# Patient Record
Sex: Female | Born: 1950 | Race: White | Hispanic: No | Marital: Single | Smoking: Former smoker
Health system: Southern US, Community
[De-identification: ages and names within clinical notes are randomized; demographics above are authoritative.]

## PROBLEM LIST (undated history)

## (undated) DIAGNOSIS — R7303 Prediabetes: Secondary | ICD-10-CM

## (undated) DIAGNOSIS — I240 Acute coronary thrombosis not resulting in myocardial infarction: Secondary | ICD-10-CM

## (undated) DIAGNOSIS — E119 Type 2 diabetes mellitus without complications: Secondary | ICD-10-CM

## (undated) DIAGNOSIS — I10 Essential (primary) hypertension: Secondary | ICD-10-CM

## (undated) DIAGNOSIS — J302 Other seasonal allergic rhinitis: Secondary | ICD-10-CM

## (undated) DIAGNOSIS — K219 Gastro-esophageal reflux disease without esophagitis: Secondary | ICD-10-CM

## (undated) DIAGNOSIS — E7800 Pure hypercholesterolemia, unspecified: Secondary | ICD-10-CM

## (undated) DIAGNOSIS — J449 Chronic obstructive pulmonary disease, unspecified: Secondary | ICD-10-CM

## (undated) DIAGNOSIS — M1990 Unspecified osteoarthritis, unspecified site: Secondary | ICD-10-CM

## (undated) DIAGNOSIS — F40240 Claustrophobia: Secondary | ICD-10-CM

## (undated) DIAGNOSIS — J45909 Unspecified asthma, uncomplicated: Secondary | ICD-10-CM

## (undated) DIAGNOSIS — K5730 Diverticulosis of large intestine without perforation or abscess without bleeding: Secondary | ICD-10-CM

## (undated) HISTORY — DX: Other seasonal allergic rhinitis: J30.2

## (undated) HISTORY — DX: Essential (primary) hypertension: I10

## (undated) HISTORY — DX: Acute coronary thrombosis not resulting in myocardial infarction: I24.0

## (undated) HISTORY — DX: Pure hypercholesterolemia, unspecified: E78.00

## (undated) HISTORY — PX: COLON RESECTION: SHX5231

---

## 1974-09-29 HISTORY — PX: ABLATION ON ENDOMETRIOSIS: SHX5787

## 1993-09-29 HISTORY — PX: FOOT SURGERY: SHX648

## 2006-07-29 ENCOUNTER — Ambulatory Visit: Payer: Self-pay

## 2006-10-28 ENCOUNTER — Ambulatory Visit: Payer: Self-pay | Admitting: Internal Medicine

## 2007-02-24 ENCOUNTER — Ambulatory Visit: Payer: Self-pay | Admitting: Unknown Physician Specialty

## 2007-03-11 ENCOUNTER — Ambulatory Visit: Payer: Self-pay | Admitting: Unknown Physician Specialty

## 2007-09-30 LAB — HM PAP SMEAR: HM Pap smear: NORMAL

## 2008-07-05 ENCOUNTER — Ambulatory Visit: Payer: Self-pay | Admitting: Unknown Physician Specialty

## 2008-09-29 DIAGNOSIS — K5730 Diverticulosis of large intestine without perforation or abscess without bleeding: Secondary | ICD-10-CM

## 2008-09-29 HISTORY — DX: Diverticulosis of large intestine without perforation or abscess without bleeding: K57.30

## 2008-09-29 HISTORY — PX: CARDIAC CATHETERIZATION: SHX172

## 2008-09-29 HISTORY — PX: COLON SURGERY: SHX602

## 2008-09-29 LAB — HM COLONOSCOPY

## 2008-11-27 ENCOUNTER — Ambulatory Visit: Payer: Self-pay | Admitting: Unknown Physician Specialty

## 2009-01-02 ENCOUNTER — Ambulatory Visit: Payer: Self-pay | Admitting: Surgery

## 2009-01-02 ENCOUNTER — Ambulatory Visit: Payer: Self-pay | Admitting: Cardiology

## 2009-01-08 ENCOUNTER — Inpatient Hospital Stay: Payer: Self-pay | Admitting: Surgery

## 2009-09-29 HISTORY — PX: CHOLECYSTECTOMY: SHX55

## 2010-01-10 ENCOUNTER — Ambulatory Visit: Payer: Self-pay | Admitting: Cardiology

## 2010-02-04 ENCOUNTER — Ambulatory Visit: Payer: Self-pay | Admitting: Internal Medicine

## 2010-02-06 ENCOUNTER — Ambulatory Visit: Payer: Self-pay | Admitting: Internal Medicine

## 2010-02-26 ENCOUNTER — Ambulatory Visit: Payer: Self-pay | Admitting: Surgery

## 2010-04-16 ENCOUNTER — Other Ambulatory Visit: Payer: Self-pay | Admitting: Unknown Physician Specialty

## 2011-02-18 ENCOUNTER — Ambulatory Visit: Payer: Self-pay | Admitting: Internal Medicine

## 2011-09-30 LAB — HM MAMMOGRAPHY: HM MAMMO: NORMAL

## 2012-08-12 ENCOUNTER — Ambulatory Visit: Payer: Self-pay | Admitting: Internal Medicine

## 2013-07-29 DIAGNOSIS — H4440 Unspecified hypotony of eye: Secondary | ICD-10-CM | POA: Insufficient documentation

## 2013-07-29 DIAGNOSIS — H16079 Perforated corneal ulcer, unspecified eye: Secondary | ICD-10-CM | POA: Insufficient documentation

## 2013-12-27 ENCOUNTER — Ambulatory Visit: Payer: Self-pay | Admitting: Otolaryngology

## 2014-01-31 ENCOUNTER — Encounter: Payer: Self-pay | Admitting: Pulmonary Disease

## 2014-01-31 ENCOUNTER — Encounter (INDEPENDENT_AMBULATORY_CARE_PROVIDER_SITE_OTHER): Payer: Self-pay

## 2014-01-31 ENCOUNTER — Ambulatory Visit (INDEPENDENT_AMBULATORY_CARE_PROVIDER_SITE_OTHER): Payer: BC Managed Care – PPO | Admitting: Pulmonary Disease

## 2014-01-31 VITALS — BP 126/68 | HR 81 | Ht 62.5 in | Wt 129.0 lb

## 2014-01-31 DIAGNOSIS — R4702 Dysphasia: Secondary | ICD-10-CM

## 2014-01-31 DIAGNOSIS — R0602 Shortness of breath: Secondary | ICD-10-CM

## 2014-01-31 DIAGNOSIS — R4789 Other speech disturbances: Secondary | ICD-10-CM

## 2014-01-31 DIAGNOSIS — J449 Chronic obstructive pulmonary disease, unspecified: Secondary | ICD-10-CM

## 2014-01-31 DIAGNOSIS — R059 Cough, unspecified: Secondary | ICD-10-CM

## 2014-01-31 DIAGNOSIS — R05 Cough: Secondary | ICD-10-CM

## 2014-01-31 MED ORDER — TIOTROPIUM BROMIDE MONOHYDRATE 18 MCG IN CAPS: 18.0000 ug | ORAL_CAPSULE | Freq: Every day | RESPIRATORY_TRACT | Status: DC

## 2014-01-31 MED ORDER — AEROCHAMBER MV MISC: Status: DC

## 2014-01-31 MED ORDER — BENZONATATE 100 MG PO CAPS: 100.0000 mg | ORAL_CAPSULE | Freq: Four times a day (QID) | ORAL | Status: DC | PRN

## 2014-01-31 NOTE — Assessment & Plan Note (Signed)
She had the sensation of food impaction several weeks ago when she was quite ill. This is improved. She was told it was associated with her gastroesophageal reflux disease. We will dyspnea keep an eye on this in the future. It doesn't seem to be a problem right now.

## 2014-01-31 NOTE — Progress Notes (Signed)
Subjective:    Patient ID: Monique Sanchez, female    DOB: 03-10-1951, 63 y.o.   MRN: [id number]  HPI  This is a very pleasant 63 year old female who comes her clinic today for evaluation of cough. This started approximately 2 months ago when she had a significant sinus infection. The sinus infection was associated with fevers chills and body aches and she had the at work about a week. She was seen by [facility] and was treated with prednisone and Levaquin. This helped clean up the sinus infection but she has had a cough ever since then. She still continues to cough up some clear mucus on a regular basis. Notably, prior to the sinus infection she never had problems with a cough.  She been treated with Symbicort for the last 2 years after she told her primary care doctor that she had been experiencing some shortness of breath. She was never given a diagnosis. She said that the Symbicort to help with shortness of breath. Specifically she says that she'll get short of breath with walking up a flight of stairs or running the vacuum cleaner. She continues to work on a regular basis and has not had problems with shortness of breath at work. She does not exercise on a regular basis. She does not get flu shots because she had a very severe reaction to one many years ago. She has not had a pneumonia shot.  She previously smoked one half packs of cigarettes daily through 2009.  She occasionally notes that food gets stuck when she swallows.   Past Medical History  Diagnosis Date  . Hypertension   . Hypercholesteremia   . Seasonal allergies   . Blockage of coronary artery of heart      Family History  Problem Relation Age of Onset  . Heart disease Mother   . Heart disease Father   . Cancer Mother     lung  . Cancer Brother     lung     History   Social History  . Marital Status: Single    Spouse Name: N/A    Number of Children: N/A  . Years of Education: N/A    Occupational History  . Not on file.   Social History Main Topics  . Smoking status: Former Smoker -- 0.50 packs/day for 30 years    Types: Cigarettes    Quit date: 09/30/2007  . Smokeless tobacco: Never Used  . Alcohol Use: Yes     Comment: occasional wine  . Drug Use: No  . Sexual Activity: Not on file   Other Topics Concern  . Not on file   Social History Narrative  . No narrative on file     Allergies  Allergen Reactions  . Influenza Vaccines     Muscle cramps, shaking     No outpatient prescriptions prior to visit.   No facility-administered medications prior to visit.      Review of Systems  Constitutional: Negative for fever and unexpected weight change.  HENT: Positive for sinus pressure and trouble swallowing. Negative for congestion, dental problem, ear pain, nosebleeds, postnasal drip, rhinorrhea, sneezing and sore throat.   Eyes: Negative for redness and itching.  Respiratory: Positive for cough and shortness of breath. Negative for chest tightness and wheezing.   Cardiovascular: Negative for palpitations and leg swelling.  Gastrointestinal: Negative for nausea and vomiting.  Genitourinary: Negative for dysuria.  Musculoskeletal: Positive for joint swelling.  Skin: Negative for rash.  Neurological: Negative for headaches.  Hematological: Does not bruise/bleed easily.  Psychiatric/Behavioral: Negative for dysphoric mood. The patient is not nervous/anxious.        Objective:   Physical Exam  Filed Vitals:   01/31/14 1155  BP: 126/68  Pulse: 81  Height: 5' 2.5" (1.588 m)  Weight: 129 lb (58.514 kg)  SpO2: 96%   Gen: well appearing, no acute distress HEENT: NCAT, PERRL, EOMi, OP clear, neck supple without masses PULM: Poor air movement bilaterally CV: RRR, no mgr, no JVD AB: BS+, soft, nontender, no hsm Ext: warm, no edema, no clubbing, no cyanosis Derm: no rash or skin breakdown Neuro: A&Ox4, CN II-XII intact, strength 5/5 in all 4  extremities   April 2015 chest x-ray reviewed  hyperinflation bilaterally      Assessment & Plan:   COPD, severe COPD: GOLD Grade C Combined recommendations from the [place], [employer] of Chest Physicians, Investment banker, corporate, European Respiratory Society (Qaseem A et al, Ann Intern Med. 2011;155(3):179) recommends tobacco cessation, pulmonary rehab (for symptomatic patients with an FEV1 < 50% predicted), supplemental oxygen (for patients with SaO2 <88% or paO2 <55), and appropriate bronchodilator therapy.  In regards to long acting bronchodilators, they recommend monotherapy (FEV1 60-80% with symptoms weak evidence, FEV1 with symptoms <60% strong evidence), or combination therapy (FEV1 <60% with symptoms, strong recommendation, moderate evidence).  One should also provide patients with annual immunizations and consider therapy for prevention of COPD exacerbations (ie. roflumilast or azithromycin) when appopriate.  -O2 therapy: not indicated -Immunizations: flu allergy -Tobacco use: quit 2009 -Exercise: encouraged regular exercise -Bronchodilator therapy: add spacer for symbicort, add spiriva -Exacerbation prevention: spiriva     Cough This is really her primary problem and I think is mostly due to 2 cyclical cough from all the on going cough she's had in the last several months as well as ongoing postnasal drip and acid reflux. I don't think his COPD is contributing significantly to the cough.  Plan: -Add Pepcid at night -Add nasal steroid and antihistamine -Voice rest encouraged -Tessalon when necessary  Dysphasia She had the sensation of food impaction several weeks ago when she was quite ill. This is improved. She was told it was associated with her gastroesophageal reflux disease. We will dyspnea keep an eye on this in the future. It doesn't seem to be a problem right now.    Updated Medication List Outpatient Encounter Prescriptions as  of 01/31/2014  Medication Sig  . acetaminophen (TYLENOL) 325 MG tablet Take 650 mg by mouth every 6 (six) hours as needed.  . budesonide-formoterol (SYMBICORT) 160-4.5 MCG/ACT inhaler Inhale 2 puffs into the lungs 2 (two) times daily.  . carvedilol (COREG CR) 10 MG 24 hr capsule Take 10 mg by mouth daily.  . hydrochlorothiazide (HYDRODIURIL) 25 MG tablet Take 25 mg by mouth daily.  . lansoprazole (PREVACID) 30 MG capsule Take 30 mg by mouth daily at 12 noon.  Marland Kitchen OVER THE COUNTER MEDICATION Phason- otc gas relief- prn  . rosuvastatin (CRESTOR) 5 MG tablet Take 5 mg by mouth daily.  . benzonatate (TESSALON) 100 MG capsule Take 1 capsule (100 mg total) by mouth every 6 (six) hours as needed for cough.  . Spacer/Aero-Holding Chambers (AEROCHAMBER MV) inhaler Use as instructed  . tiotropium (SPIRIVA HANDIHALER) 18 MCG inhalation capsule Place 1 capsule (18 mcg total) into inhaler and inhale daily.

## 2014-01-31 NOTE — Assessment & Plan Note (Signed)
This is really her primary problem and I think is mostly due to 2 cyclical cough from all the on going cough she's had in the last several months as well as ongoing postnasal drip and acid reflux. I don't think his COPD is contributing significantly to the cough.  Plan: -Add Pepcid at night -Add nasal steroid and antihistamine -Voice rest encouraged -Tessalon when necessary

## 2014-01-31 NOTE — Assessment & Plan Note (Signed)
COPD: GOLD Grade C Combined recommendations from the [employer], [employer], Investment banker, corporate, [place] (Qaseem A et al, Ann Intern Med. 2011;155(3):179) recommends tobacco cessation, pulmonary rehab (for symptomatic patients with an FEV1 < 50% predicted), supplemental oxygen (for patients with SaO2 <88% or paO2 <55), and appropriate bronchodilator therapy.  In regards to long acting bronchodilators, they recommend monotherapy (FEV1 60-80% with symptoms weak evidence, FEV1 with symptoms <60% strong evidence), or combination therapy (FEV1 <60% with symptoms, strong recommendation, moderate evidence).  One should also provide patients with annual immunizations and consider therapy for prevention of COPD exacerbations (ie. roflumilast or azithromycin) when appopriate.  -O2 therapy: not indicated -Immunizations: flu allergy -Tobacco use: quit 2009 -Exercise: encouraged regular exercise -Bronchodilator therapy: add spacer for symbicort, add spiriva -Exacerbation prevention: spiriva

## 2014-01-31 NOTE — Patient Instructions (Signed)
Take spiriva with the symbicort Use the symbicort with a spacer  Exercise regularly  For the cough: I recommend treating your sinuses with zyrtec and nasacort; take pepcid at night in addition to the lansoprazole Take the tessalon perles for cough Rest your voice, try not to clear your throat  We will se you back in 3-4 weeks or sooner if needed

## 2014-03-01 ENCOUNTER — Encounter: Payer: Self-pay | Admitting: Pulmonary Disease

## 2014-03-01 ENCOUNTER — Ambulatory Visit (INDEPENDENT_AMBULATORY_CARE_PROVIDER_SITE_OTHER): Payer: BC Managed Care – PPO | Admitting: Pulmonary Disease

## 2014-03-01 VITALS — BP 116/64 | HR 75 | Ht 62.5 in | Wt 134.0 lb

## 2014-03-01 DIAGNOSIS — T148XXA Other injury of unspecified body region, initial encounter: Secondary | ICD-10-CM

## 2014-03-01 DIAGNOSIS — J449 Chronic obstructive pulmonary disease, unspecified: Secondary | ICD-10-CM

## 2014-03-01 DIAGNOSIS — Z23 Encounter for immunization: Secondary | ICD-10-CM

## 2014-03-01 DIAGNOSIS — J309 Allergic rhinitis, unspecified: Secondary | ICD-10-CM | POA: Insufficient documentation

## 2014-03-01 NOTE — Progress Notes (Signed)
Subjective:    Patient ID: Monique Sanchez, female    DOB: 16-Sep-1951, 63 y.o.   MRN: [id number]  Synopsis: First saw [facility] for GOLD Grade C COPD in 2015 01/2014 Simple spirometry> Ratio 36%, FEV1  0.54L  (24% pred)  HPI  03/01/2014 routine office visit> Monique Sanchez has been doing quite well since last visit. Her cough has improved. She does not have significant shortness of breath. She cannot tell that the Spiriva made any difference in her shortness of breath. She continues to take Symbicort. She continues to have a little bit of postnasal drip. She is taking Zyrtec for that. She's not taking Flonase. Unfortunately she stepped on a rusty nail last week and she needs a tetanus shot for that.  Past Medical History  Diagnosis Date  . Hypertension   . Hypercholesteremia   . Seasonal allergies   . Blockage of coronary artery of heart      Review of Systems  Constitutional: Negative for fever, chills and fatigue.  HENT: Positive for postnasal drip. Negative for nosebleeds and rhinorrhea.   Respiratory: Negative for cough, shortness of breath and wheezing.   Cardiovascular: Negative for chest pain, palpitations and leg swelling.       Objective:   Physical Exam  Filed Vitals:   03/01/14 1027  BP: 116/64  Pulse: 75  Height: 5' 2.5" (1.588 m)  Weight: 134 lb (60.782 kg)  SpO2: 100%  RA  Gen: well appearing, no acute distress HEENT: NCAT, EOMi, OP clear, PULM: Limited air movement but no wheezing CV: RRR, no mgr, no JVD AB: BS+, soft, nontender, no hsm Ext: warm, no edema, no clubbing, no cyanosis Derm: Left foot small puncture wound on sole of foot, no surrounding cellulitis     Assessment & Plan:   COPD Grade C Despite her severe airflow obstruction she has done really well with Symbicort and Spriva. The main reason why she is feeling better now is that her cough is better. It's not clear to me that this is due to [place].   She does not have frequent  exacerbations and so even though her airflow obstruction as severe she is a GOLD grade C.  Plan: -Continue Symbicort -I told her to use the Spiriva continuously for a month, if she does not see improvement in her shortness of breath and she can stop it -Followup with me 6 months -Flu shot in the fall  Puncture wound She stepped on a nail recently. The puncture wound is clean and dry and there is no evidence of surrounding cellulitis. She needs a tetanus shot. She also needs a primary care physician.  Allergic rhinitis Continue Zyrtec I again reminded her of the benefits of Flonase or Nasacort.  She will consider using it.    Updated Medication List Outpatient Encounter Prescriptions as of 03/01/2014  Medication Sig  . acetaminophen (TYLENOL) 325 MG tablet Take 650 mg by mouth every 6 (six) hours as needed.  . budesonide-formoterol (SYMBICORT) 160-4.5 MCG/ACT inhaler Inhale 2 puffs into the lungs 2 (two) times daily.  . carvedilol (COREG CR) 10 MG 24 hr capsule Take 10 mg by mouth daily.  . hydrochlorothiazide (HYDRODIURIL) 25 MG tablet Take 25 mg by mouth daily.  . lansoprazole (PREVACID) 30 MG capsule Take 30 mg by mouth daily at 12 noon.  Marland Kitchen OVER THE COUNTER MEDICATION Phason- otc gas relief- prn  . rosuvastatin (CRESTOR) 5 MG tablet Take 5 mg by mouth daily.  Marland Kitchen Spacer/Aero-Holding Chambers (AEROCHAMBER MV)  inhaler Use as instructed  . tiotropium (SPIRIVA HANDIHALER) 18 MCG inhalation capsule Place 1 capsule (18 mcg total) into inhaler and inhale daily.  . [DISCONTINUED] benzonatate (TESSALON) 100 MG capsule Take 1 capsule (100 mg total) by mouth every 6 (six) hours as needed for cough.

## 2014-03-01 NOTE — Assessment & Plan Note (Signed)
She stepped on a nail recently. The puncture wound is clean and dry and there is no evidence of surrounding cellulitis. She needs a tetanus shot. She also needs a primary care physician.

## 2014-03-01 NOTE — Assessment & Plan Note (Signed)
Despite her severe airflow obstruction she has done really well with Symbicort and Spriva. The main reason why she is feeling better now is that her cough is better. It's not clear to me that this is due to [place].   She does not have frequent exacerbations and so even though her airflow obstruction as severe she is a GOLD grade C.  Plan: -Continue Symbicort -I told her to use the Spiriva continuously for a month, if she does not see improvement in her shortness of breath and she can stop it -Followup with me 6 months -Flu shot in the fall

## 2014-03-01 NOTE — Assessment & Plan Note (Signed)
Continue Zyrtec I again reminded her of the benefits of Flonase or Nasacort.  She will consider using it.

## 2014-03-01 NOTE — Patient Instructions (Signed)
Keep taking your Symbicort and Spiriva If you find after a month of using both medicines that your shortness of breath is not improved, then you can stop using the Spiriva If your nasal congestion is bothering you, try using Nasacort on a regular basis  We will see you back in 6 months or sooner if needed

## 2014-04-06 ENCOUNTER — Ambulatory Visit (INDEPENDENT_AMBULATORY_CARE_PROVIDER_SITE_OTHER): Payer: BC Managed Care – PPO | Admitting: Adult Health

## 2014-04-06 ENCOUNTER — Encounter: Payer: Self-pay | Admitting: Adult Health

## 2014-04-06 VITALS — BP 136/78 | HR 64 | Temp 98.2°F | Resp 14 | Ht 62.0 in | Wt 133.0 lb

## 2014-04-06 DIAGNOSIS — R7309 Other abnormal glucose: Secondary | ICD-10-CM

## 2014-04-06 DIAGNOSIS — M542 Cervicalgia: Secondary | ICD-10-CM | POA: Insufficient documentation

## 2014-04-06 DIAGNOSIS — R739 Hyperglycemia, unspecified: Secondary | ICD-10-CM

## 2014-04-06 DIAGNOSIS — Z1239 Encounter for other screening for malignant neoplasm of breast: Secondary | ICD-10-CM

## 2014-04-06 DIAGNOSIS — Z13820 Encounter for screening for osteoporosis: Secondary | ICD-10-CM

## 2014-04-06 DIAGNOSIS — Z1211 Encounter for screening for malignant neoplasm of colon: Secondary | ICD-10-CM

## 2014-04-06 LAB — HEMOGLOBIN A1C: Hgb A1c MFr Bld: 6.2 % (ref 4.6–6.5)

## 2014-04-06 MED ORDER — METHOCARBAMOL 500 MG PO TABS: 500.0000 mg | ORAL_TABLET | Freq: Three times a day (TID) | ORAL | Status: DC | PRN

## 2014-04-06 NOTE — Patient Instructions (Signed)
   Thank you for choosing [place] at [facility] for your health care needs.  Please have your labs drawn prior to leaving the office.  The results will be available through MyChart for your convenience. Please remember to activate this. The activation code is located at the end of this form.  Please see Hoyle Sauer prior to leaving the office so that she help set up appointments for the following:   Mammogram  Dexa Scan  Screening Colonoscopy  You will need to schedule your annual physical exam including PAP. You can do this at your convenience.  For your neck muscle tension I am prescribing Robaxin 500 mg 3 times a day as needed. This may make you sleepy so please do not drive while taking this medication.  Apply ice to the affected area for 20 min. You may alternate with heat.  Continue ibuprofen for pain and inflammation.  Do stretching exercises.  Please call with any questions or concerns.

## 2014-04-06 NOTE — Progress Notes (Signed)
Patient ID: DEMETRESS TIFT, female   DOB: [birth year], 63 y.o.   MRN: [id number]   Subjective:    Patient ID: MEHAR SAGEN, female    DOB: October 21, 1950, 63 y.o.   MRN: [id number]  HPI Pt is a 63 y/o female who presents to clinic to establish care. Previously followed at [facility] by Dr. Doy Hutching.   Health Maintenance:  Tdap - 03/01/14  Flu shot - Allergy  PAP - 6 year ago. Will need.  Mammography - 2 years ago - reported normal  Dexa - 10 years ago  Colonoscopy - 2009 - Hx of Polyps. Partial colectomy for diverticulitis  Labs - 06/2014  Tobacco Use - Remote hx  Dental Exams - Every 6 months (04/04/14)  Vision Exam - 10/29/13 [facility]  Last Physical Exam - 12/2013   Current Outpatient Prescriptions on File Prior to Visit  Medication Sig Dispense Refill  . acetaminophen (TYLENOL) 325 MG tablet Take 650 mg by mouth every 6 (six) hours as needed.      . budesonide-formoterol (SYMBICORT) 160-4.5 MCG/ACT inhaler Inhale 2 puffs into the lungs 2 (two) times daily.      . carvedilol (COREG CR) 10 MG 24 hr capsule Take 10 mg by mouth daily.      . hydrochlorothiazide (HYDRODIURIL) 25 MG tablet Take 25 mg by mouth daily.      . lansoprazole (PREVACID) 30 MG capsule Take 30 mg by mouth daily at 12 noon.      Marland Kitchen OVER THE COUNTER MEDICATION Phason- otc gas relief- prn      . rosuvastatin (CRESTOR) 5 MG tablet Take 5 mg by mouth daily.      Marland Kitchen Spacer/Aero-Holding Chambers (AEROCHAMBER MV) inhaler Use as instructed  1 each  0  . tiotropium (SPIRIVA HANDIHALER) 18 MCG inhalation capsule Place 1 capsule (18 mcg total) into inhaler and inhale daily.  30 capsule  2   No current facility-administered medications on file prior to visit.     Review of Systems  Constitutional: Negative.   HENT: Negative.   Eyes: Negative.   Respiratory: Negative.   Cardiovascular: Negative.   Gastrointestinal: Negative.   Endocrine: Negative.   Genitourinary: Negative.   Musculoskeletal:  Positive for neck pain (muscle spasms right side).  Skin: Negative.   Allergic/Immunologic: Negative.   Neurological: Negative.   Hematological: Negative.   Psychiatric/Behavioral: Negative.        Objective:  There were no vitals taken for this visit.   Physical Exam  Constitutional: She is oriented to person, place, and time. No distress.  HENT:  Head: Normocephalic and atraumatic.  Eyes: Conjunctivae and EOM are normal.  Neck: Normal range of motion. Neck supple.  Cardiovascular: Normal rate, regular rhythm, normal heart sounds and intact distal pulses.  Exam reveals no gallop and no friction rub.   No murmur heard. Pulmonary/Chest: Effort normal and breath sounds normal. No respiratory distress. She has no wheezes. She has no rales.  Musculoskeletal:  Decreased ROM neck. Trapezius muscle tension on the right side of neck.  Neurological: She is alert and oriented to person, place, and time. She has normal reflexes. Coordination normal.  Skin: Skin is warm and dry.  Psychiatric: She has a normal mood and affect. Her behavior is normal. Judgment and thought content normal.      Assessment & Plan:   1. Screening for breast cancer Refer for mammogram. Last one 2 years ago. New pt. - MM DIGITAL SCREENING BILATERAL; Future  2. Screen  for colon cancer Hx of polyps. Partial colectomy for diverticulosis with multiple episodes of diverticulitis. Ref back to Dr. Vira Agar for screening colonoscopy. - Ambulatory referral to Gastroenterology  3. Blood glucose elevated Reports elevated glucose in the past without diagnosis of diabetes. Check A1c - Hemoglobin A1c; Future - Hemoglobin A1c  4. Screening for osteoporosis Dexa scan last done > 7 years ago. Will order one to be done. - DG Bone Density; Future  5. Neck pain on right side Suspect strain of trapezius muscle on the right. Decreased ROM of neck secondary to stiffness and pain. Continue ibuprofen. Start robaxin for muscle  spasms. Advised on stretching exercises. Ice alternating with heat.

## 2014-04-06 NOTE — Progress Notes (Signed)
Pre visit review using our clinic review tool, if applicable. No additional management support is needed unless otherwise documented below in the visit note. 

## 2014-04-12 ENCOUNTER — Other Ambulatory Visit: Payer: Self-pay | Admitting: Orthopedic Surgery

## 2014-04-12 ENCOUNTER — Ambulatory Visit
Admission: RE | Admit: 2014-04-12 | Discharge: 2014-04-12 | Disposition: A | Discharge status: Home / Self Care | Payer: BC Managed Care – PPO | Source: Ambulatory Visit | Attending: Orthopedic Surgery | Admitting: Orthopedic Surgery

## 2014-04-12 DIAGNOSIS — M542 Cervicalgia: Secondary | ICD-10-CM

## 2014-04-13 ENCOUNTER — Ambulatory Visit
Admission: RE | Admit: 2014-04-13 | Discharge: 2014-04-13 | Disposition: A | Discharge status: Home / Self Care | Payer: BC Managed Care – PPO | Source: Ambulatory Visit | Attending: Orthopedic Surgery | Admitting: Orthopedic Surgery

## 2014-04-13 DIAGNOSIS — M542 Cervicalgia: Secondary | ICD-10-CM

## 2014-04-14 ENCOUNTER — Ambulatory Visit: Payer: BC Managed Care – PPO | Admitting: Family Medicine

## 2014-04-17 ENCOUNTER — Other Ambulatory Visit: Payer: Self-pay | Admitting: Orthopedic Surgery

## 2014-04-18 ENCOUNTER — Encounter (HOSPITAL_COMMUNITY)
Admission: RE | Admit: 2014-04-18 | Discharge: 2014-04-18 | Disposition: A | Discharge status: Home / Self Care | Payer: BC Managed Care – PPO | Source: Ambulatory Visit | Attending: Orthopedic Surgery | Admitting: Orthopedic Surgery

## 2014-04-18 ENCOUNTER — Encounter (HOSPITAL_COMMUNITY): Payer: Self-pay

## 2014-04-18 HISTORY — DX: Chronic obstructive pulmonary disease, unspecified: J44.9

## 2014-04-18 HISTORY — DX: Unspecified osteoarthritis, unspecified site: M19.90

## 2014-04-18 HISTORY — DX: Diverticulosis of large intestine without perforation or abscess without bleeding: K57.30

## 2014-04-18 HISTORY — DX: Claustrophobia: F40.240

## 2014-04-18 LAB — TYPE AND SCREEN
ABO/RH(D): A POS
Antibody Screen: NEGATIVE

## 2014-04-18 LAB — COMPREHENSIVE METABOLIC PANEL
ALBUMIN: 4.1 g/dL (ref 3.5–5.2)
ALT: 24 U/L (ref 0–35)
ANION GAP: 12 (ref 5–15)
AST: 24 U/L (ref 0–37)
Alkaline Phosphatase: 86 U/L (ref 39–117)
BUN: 15 mg/dL (ref 6–23)
CO2: 28 mEq/L (ref 19–32)
Calcium: 9.3 mg/dL (ref 8.4–10.5)
Chloride: 97 mEq/L (ref 96–112)
Creatinine, Ser: 0.63 mg/dL (ref 0.50–1.10)
GFR calc non Af Amer: >90 mL/min (ref >90)
Glucose, Bld: 106 mg/dL — ABNORMAL HIGH (ref 70–99)
POTASSIUM: 3.9 meq/L (ref 3.7–5.3)
Sodium: 137 mEq/L (ref 137–147)
TOTAL PROTEIN: 7.1 g/dL (ref 6.0–8.3)
Total Bilirubin: 0.8 mg/dL (ref 0.3–1.2)

## 2014-04-18 LAB — CBC WITH DIFFERENTIAL/PLATELET
BASOS PCT: 0 % (ref 0–1)
Basophils Absolute: 0 10*3/uL (ref 0.0–0.1)
EOS ABS: 0.1 10*3/uL (ref 0.0–0.7)
EOS PCT: 1 % (ref 0–5)
HCT: 44.6 % (ref 36.0–46.0)
HEMOGLOBIN: 14.7 g/dL (ref 12.0–15.0)
Lymphocytes Relative: 18 % (ref 12–46)
Lymphs Abs: 1.6 10*3/uL (ref 0.7–4.0)
MCH: 31 pg (ref 26.0–34.0)
MCHC: 33 g/dL (ref 30.0–36.0)
MCV: 94.1 fL (ref 78.0–100.0)
MONOS PCT: 13 % — AB (ref 3–12)
Monocytes Absolute: 1.1 10*3/uL — ABNORMAL HIGH (ref 0.1–1.0)
NEUTROS PCT: 68 % (ref 43–77)
Neutro Abs: 5.8 10*3/uL (ref 1.7–7.7)
PLATELETS: 258 10*3/uL (ref 150–400)
RBC: 4.74 MIL/uL (ref 3.87–5.11)
RDW: 12.1 % (ref 11.5–15.5)
WBC: 8.6 10*3/uL (ref 4.0–10.5)

## 2014-04-18 LAB — URINALYSIS, ROUTINE W REFLEX MICROSCOPIC
BILIRUBIN URINE: NEGATIVE
GLUCOSE, UA: NEGATIVE mg/dL
Hgb urine dipstick: NEGATIVE
KETONES UR: NEGATIVE mg/dL
Leukocytes, UA: NEGATIVE
Nitrite: NEGATIVE
Protein, ur: NEGATIVE mg/dL
Specific Gravity, Urine: 1.014 (ref 1.005–1.030)
Urobilinogen, UA: 0.2 mg/dL (ref 0.0–1.0)
pH: 6 (ref 5.0–8.0)

## 2014-04-18 LAB — ABO/RH: ABO/RH(D): A POS

## 2014-04-18 LAB — PROTIME-INR
INR: 0.92 (ref 0.00–1.49)
Prothrombin Time: 12.4 seconds (ref 11.6–15.2)

## 2014-04-18 LAB — SURGICAL PCR SCREEN
MRSA, PCR: NEGATIVE
Staphylococcus aureus: NEGATIVE

## 2014-04-18 LAB — APTT: aPTT: 23 seconds — ABNORMAL LOW (ref 24–37)

## 2014-04-18 NOTE — Pre-Procedure Instructions (Signed)
Monique Sanchez  04/18/2014   Your procedure is scheduled on:  Thursday, July 23  Report to [facility] Admitting at 0530 AM.  Call this number if you have problems the morning of surgery: [phone]   Remember:   Do not eat food or drink liquids after midnight.Wednesday night   Take these medicines the morning of surgery with A SIP OF WATER: Symbicort inhaler,carvedilol,Prevacid,Spiriva   Do not wear jewelry, make-up or nail polish.  Do not wear lotions, powders, or perfumes. You may not wear deodorant.  Do not shave 48 hours prior to surgery.   Do not bring valuables to the hospital.  [facility] is not responsible  for any belongings or valuables.               Contacts, dentures or bridgework may not be worn into surgery.  Leave suitcase in the car. After surgery it may be brought to your room.  For patients admitted to the hospital, discharge time is determined by your  treatment team.     Special Instructions: [place] - Preparing for Surgery  Before surgery, you can play an important role.  Because skin is not sterile, your skin needs to be as free of germs as possible.  You can reduce the number of germs on you skin by washing with CHG (chlorahexidine gluconate) soap before surgery.  CHG is an antiseptic cleaner which kills germs and bonds with the skin to continue killing germs even after washing.  Please DO NOT use if you have an allergy to CHG or antibacterial soaps.  If your skin becomes reddened/irritated stop using the CHG and inform your nurse when you arrive at Short Stay.  Do not shave (including legs and underarms) for at least 48 hours prior to the first CHG shower.  You may shave your face.  Please follow these instructions carefully:   1.  Shower with CHG Soap the night before surgery and the   morning of Surgery.  2.  If you choose to wash your hair, wash your hair first as usual with your normal shampoo.  3.  After you shampoo, rinse your hair  and body thoroughly to remove the    Shampoo.  4.  Use CHG as you would any other liquid soap.  You can apply chg directly   to the skin and wash gently with scrungie or a clean washcloth.  5.  Apply the CHG Soap to your body ONLY FROM THE NECK DOWN. Do not use on open wounds or open sores.  Avoid contact with your eyes, ears, mouth and genitals (private parts).  Wash genitals (private parts)  with your normal soap.  6.  Wash thoroughly, paying special attention to the area where your surgery  will be performed.  7.  Thoroughly rinse your body with warm water from the neck down.  8.  DO NOT shower/wash with your normal soap after using and rinsing off   the CHG Soap.  9.  Pat yourself dry with a clean towel.            10.  Wear clean pajamas.            11.  Place clean sheets on your bed the night of your first shower and do not  sleep with pets.  Day of Surgery  Do not apply any lotions/deoderants the morning of surgery.  Please wear clean clothes to the hospital/surgery center.     Please read  over the following fact sheets that you were given: Pain Booklet, Coughing and Deep Breathing, Blood Transfusion Information and Surgical Site Infection Prevention

## 2014-04-19 MED ORDER — CEFAZOLIN SODIUM-DEXTROSE 2-3 GM-% IV SOLR
2.0000 g | INTRAVENOUS | Status: AC
  Administered 2014-04-20: 2 g via INTRAVENOUS
  Filled 2014-04-19: qty 50

## 2014-04-19 NOTE — H&P (Signed)
PREOPERATIVE H&P  Chief Complaint: profound right shoulder weakness  HPI: Monique Sanchez is a 63 y.o. 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  (see office notes for additional details regarding the patient's full course of treatment)  Past Medical History  Diagnosis Date  . Hypertension   . Hypercholesteremia   . Seasonal allergies   . Blockage of coronary artery of heart     50% blockage single vessels  . COPD (chronic obstructive pulmonary disease)   . Diverticular disease of colon 2010  . Arthritis   . Osteoarthritis   . Claustrophobia    Past Surgical History  Procedure Laterality Date  . Cholecystectomy  2011  . Ablation on endometriosis  1976  . Foot surgery  1995  . Cataract extraction w/ intraocular lens implant Left 05/2014  . Cataract extraction w/ intraocular lens implant Right 06/2014    right corneal meltdown. Is s/p special procedure where eye is glued.  . Cardiac catheterization  2010    [facility]; Dr. Josefa Half  . Colon resection      for diverticulitis  . Colon surgery Left 2010   History   Social History  . Marital Status: Single    Spouse Name: N/A    Number of Children: N/A  . Years of Education: N/A   Social History Main Topics  . Smoking status: Former Smoker -- 0.50 packs/day for 30 years    Types: Cigarettes    Quit date: 09/30/2007  . Smokeless tobacco: Never Used  . Alcohol Use: Yes     Comment: occasional wine  . Drug Use: No  . Sexual Activity: Not on file   Other Topics Concern  . Not on file   Social History Narrative  . No narrative on file   Family History  Problem Relation Age of Onset  . Heart disease Mother   . Cancer Mother     lung metastatic site  . Heart disease Father   . Cancer Brother     lung  . Hyperlipidemia Brother   .  Hypertension Brother    Allergies  Allergen Reactions  . Influenza Vaccines     Muscle cramps, shaking   Prior to Admission medications   Medication Sig Start Date End Date Taking? Authorizing Provider  budesonide-formoterol (SYMBICORT) 160-4.5 MCG/ACT inhaler Inhale 2 puffs into the lungs 2 (two) times daily.    Historical Provider, MD  carvedilol (COREG CR) 10 MG 24 hr capsule Take 10 mg by mouth daily.    Historical Provider, MD  cetirizine (ZYRTEC) 10 MG tablet Take 10 mg by mouth at bedtime.    Historical Provider, MD  hydrochlorothiazide (HYDRODIURIL) 25 MG tablet Take 25 mg by mouth daily.    Historical Provider, MD  HYDROcodone-acetaminophen (NORCO/VICODIN) 5-325 MG per tablet Take 1 tablet by mouth every 6 (six) hours as needed for moderate pain.    Historical Provider, MD  lansoprazole (PREVACID) 30 MG capsule Take 30 mg by mouth daily at 12 noon.    Historical Provider, MD  OVER THE COUNTER MEDICATION Phason- otc gas relief- prn    Historical Provider, MD  rosuvastatin (CRESTOR) 5 MG tablet Take 5 mg by mouth daily.    Historical Provider, MD  Spacer/Aero-Holding Chambers (AEROCHAMBER MV) inhaler Use as instructed 01/31/14   Juanito Doom, MD  traMADol (ULTRAM) 50 MG tablet Take 50 mg by mouth every 6 (six) hours as needed for moderate pain.    Historical Provider, MD     All other systems have been reviewed and were otherwise negative with the exception of those mentioned in the HPI and as above.  Physical Exam: There were no vitals filed for this visit.  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

## 2014-04-20 ENCOUNTER — Encounter (HOSPITAL_COMMUNITY): Payer: Self-pay | Admitting: Anesthesiology

## 2014-04-20 ENCOUNTER — Ambulatory Visit (HOSPITAL_COMMUNITY): Payer: BC Managed Care – PPO | Admitting: Anesthesiology

## 2014-04-20 ENCOUNTER — Encounter (HOSPITAL_COMMUNITY)
Admission: RE | Disposition: A | Discharge status: Home / Self Care | Payer: Self-pay | Source: Ambulatory Visit | Attending: Orthopedic Surgery

## 2014-04-20 ENCOUNTER — Observation Stay (HOSPITAL_COMMUNITY)
Admission: RE | Admit: 2014-04-20 | Discharge: 2014-04-21 | Disposition: A | Discharge status: Home / Self Care | Payer: BC Managed Care – PPO | Source: Ambulatory Visit | Attending: Orthopedic Surgery | Admitting: Orthopedic Surgery

## 2014-04-20 ENCOUNTER — Encounter (HOSPITAL_COMMUNITY): Payer: BC Managed Care – PPO | Admitting: Anesthesiology

## 2014-04-20 ENCOUNTER — Observation Stay (HOSPITAL_COMMUNITY): Payer: BC Managed Care – PPO

## 2014-04-20 DIAGNOSIS — M1990 Unspecified osteoarthritis, unspecified site: Secondary | ICD-10-CM | POA: Insufficient documentation

## 2014-04-20 DIAGNOSIS — M5020 Other cervical disc displacement, unspecified cervical region: Principal | ICD-10-CM | POA: Insufficient documentation

## 2014-04-20 DIAGNOSIS — J449 Chronic obstructive pulmonary disease, unspecified: Secondary | ICD-10-CM | POA: Insufficient documentation

## 2014-04-20 DIAGNOSIS — Z01812 Encounter for preprocedural laboratory examination: Secondary | ICD-10-CM | POA: Insufficient documentation

## 2014-04-20 DIAGNOSIS — J301 Allergic rhinitis due to pollen: Secondary | ICD-10-CM | POA: Insufficient documentation

## 2014-04-20 DIAGNOSIS — J4489 Other specified chronic obstructive pulmonary disease: Secondary | ICD-10-CM | POA: Insufficient documentation

## 2014-04-20 DIAGNOSIS — Z01810 Encounter for preprocedural cardiovascular examination: Secondary | ICD-10-CM | POA: Insufficient documentation

## 2014-04-20 DIAGNOSIS — I10 Essential (primary) hypertension: Secondary | ICD-10-CM | POA: Insufficient documentation

## 2014-04-20 DIAGNOSIS — M5410 Radiculopathy, site unspecified: Secondary | ICD-10-CM | POA: Diagnosis present

## 2014-04-20 DIAGNOSIS — E7800 Pure hypercholesterolemia, unspecified: Secondary | ICD-10-CM | POA: Insufficient documentation

## 2014-04-20 DIAGNOSIS — Z87891 Personal history of nicotine dependence: Secondary | ICD-10-CM | POA: Insufficient documentation

## 2014-04-20 HISTORY — PX: ANTERIOR CERVICAL DECOMP/DISCECTOMY FUSION: SHX1161

## 2014-04-20 SURGERY — ANTERIOR CERVICAL DECOMPRESSION/DISCECTOMY FUSION 2 LEVELS
Anesthesia: General | Site: Neck

## 2014-04-20 MED ORDER — LIDOCAINE HCL (CARDIAC) 20 MG/ML IV SOLN
INTRAVENOUS | Status: AC
Start: 2014-04-20 — End: 2014-04-20
  Filled 2014-04-20: qty 5

## 2014-04-20 MED ORDER — LACTATED RINGERS IV SOLN
INTRAVENOUS | Status: DC | PRN
  Administered 2014-04-20 (×3): via INTRAVENOUS

## 2014-04-20 MED ORDER — OXYCODONE-ACETAMINOPHEN 5-325 MG PO TABS
1.0000 | ORAL_TABLET | ORAL | Status: DC | PRN
Start: 2014-04-20 — End: 2014-04-21
  Administered 2014-04-20 (×2): 2 via ORAL
  Administered 2014-04-21 (×2): 1 via ORAL
  Filled 2014-04-20 (×2): qty 1
  Filled 2014-04-20: qty 2

## 2014-04-20 MED ORDER — EPHEDRINE SULFATE 50 MG/ML IJ SOLN
INTRAMUSCULAR | Status: DC | PRN
  Administered 2014-04-20: 5 mg via INTRAVENOUS
  Administered 2014-04-20: 10 mg via INTRAVENOUS

## 2014-04-20 MED ORDER — MIDAZOLAM HCL 2 MG/2ML IJ SOLN
INTRAMUSCULAR | Status: AC
  Filled 2014-04-20: qty 2

## 2014-04-20 MED ORDER — PHENYLEPHRINE HCL 10 MG/ML IJ SOLN
10.0000 mg | INTRAMUSCULAR | Status: DC | PRN
  Administered 2014-04-20: 40 ug/min via INTRAVENOUS

## 2014-04-20 MED ORDER — SUCCINYLCHOLINE CHLORIDE 20 MG/ML IJ SOLN
INTRAMUSCULAR | Status: AC
  Filled 2014-04-20: qty 1

## 2014-04-20 MED ORDER — BISACODYL 5 MG PO TBEC
5.0000 mg | DELAYED_RELEASE_TABLET | Freq: Every day | ORAL | Status: DC | PRN
  Filled 2014-04-20: qty 1

## 2014-04-20 MED ORDER — ACETAMINOPHEN 325 MG PO TABS: 650.0000 mg | ORAL_TABLET | ORAL | Status: DC | PRN

## 2014-04-20 MED ORDER — ACETAMINOPHEN 650 MG RE SUPP: 650.0000 mg | RECTAL | Status: DC | PRN

## 2014-04-20 MED ORDER — PHENYLEPHRINE 40 MCG/ML (10ML) SYRINGE FOR IV PUSH (FOR BLOOD PRESSURE SUPPORT)
PREFILLED_SYRINGE | INTRAVENOUS | Status: AC
Start: 2014-04-20 — End: 2014-04-20
  Filled 2014-04-20: qty 10

## 2014-04-20 MED ORDER — OXYCODONE-ACETAMINOPHEN 5-325 MG PO TABS
ORAL_TABLET | ORAL | Status: AC
  Administered 2014-04-20: 2 via ORAL
  Filled 2014-04-20: qty 2

## 2014-04-20 MED ORDER — ATORVASTATIN CALCIUM 10 MG PO TABS
10.0000 mg | ORAL_TABLET | Freq: Every day | ORAL | Status: DC
  Administered 2014-04-20: 10 mg via ORAL
  Filled 2014-04-20 (×2): qty 1

## 2014-04-20 MED ORDER — PANTOPRAZOLE SODIUM 40 MG PO TBEC
40.0000 mg | DELAYED_RELEASE_TABLET | Freq: Every day | ORAL | Status: DC
  Administered 2014-04-20 – 2014-04-21 (×2): 40 mg via ORAL
  Filled 2014-04-20 (×2): qty 1

## 2014-04-20 MED ORDER — SODIUM CHLORIDE 0.9 % IJ SOLN: 3.0000 mL | INTRAMUSCULAR | Status: DC | PRN

## 2014-04-20 MED ORDER — PROPOFOL 10 MG/ML IV BOLUS
INTRAVENOUS | Status: DC | PRN
  Administered 2014-04-20: 120 mg via INTRAVENOUS

## 2014-04-20 MED ORDER — HYDROCHLOROTHIAZIDE 25 MG PO TABS
25.0000 mg | ORAL_TABLET | Freq: Every day | ORAL | Status: DC
  Administered 2014-04-20 – 2014-04-21 (×2): 25 mg via ORAL
  Filled 2014-04-20 (×2): qty 1

## 2014-04-20 MED ORDER — DOCUSATE SODIUM 100 MG PO CAPS
100.0000 mg | ORAL_CAPSULE | Freq: Two times a day (BID) | ORAL | Status: DC
  Administered 2014-04-20 – 2014-04-21 (×2): 100 mg via ORAL
  Filled 2014-04-20 (×3): qty 1

## 2014-04-20 MED ORDER — OXYCODONE HCL 5 MG PO TABS: 5.0000 mg | ORAL_TABLET | Freq: Once | ORAL | Status: DC | PRN

## 2014-04-20 MED ORDER — CEFAZOLIN SODIUM 1-5 GM-% IV SOLN
1.0000 g | Freq: Three times a day (TID) | INTRAVENOUS | Status: AC
  Administered 2014-04-20 (×2): 1 g via INTRAVENOUS
  Filled 2014-04-20 (×2): qty 50

## 2014-04-20 MED ORDER — METOCLOPRAMIDE HCL 5 MG/ML IJ SOLN
10.0000 mg | Freq: Once | INTRAMUSCULAR | Status: DC | PRN
Start: 2014-04-20 — End: 2014-04-20

## 2014-04-20 MED ORDER — CARVEDILOL PHOSPHATE ER 10 MG PO CP24
10.0000 mg | ORAL_CAPSULE | Freq: Every day | ORAL | Status: DC
  Administered 2014-04-21: 10 mg via ORAL
  Filled 2014-04-20: qty 1

## 2014-04-20 MED ORDER — SODIUM CHLORIDE 0.9 % IJ SOLN
INTRAMUSCULAR | Status: AC
  Filled 2014-04-20: qty 10

## 2014-04-20 MED ORDER — BUPIVACAINE-EPINEPHRINE (PF) 0.25% -1:200000 IJ SOLN
INTRAMUSCULAR | Status: AC
  Filled 2014-04-20: qty 30

## 2014-04-20 MED ORDER — FLEET ENEMA 7-19 GM/118ML RE ENEM: 1.0000 | ENEMA | Freq: Once | RECTAL | Status: AC | PRN

## 2014-04-20 MED ORDER — EPHEDRINE SULFATE 50 MG/ML IJ SOLN
INTRAMUSCULAR | Status: AC
  Filled 2014-04-20: qty 1

## 2014-04-20 MED ORDER — OXYCODONE HCL 5 MG/5ML PO SOLN: 5.0000 mg | Freq: Once | ORAL | Status: DC | PRN

## 2014-04-20 MED ORDER — LACTATED RINGERS IV SOLN
INTRAVENOUS | Status: DC
  Administered 2014-04-20: 50 mL/h via INTRAVENOUS

## 2014-04-20 MED ORDER — LIDOCAINE HCL (CARDIAC) 20 MG/ML IV SOLN
INTRAVENOUS | Status: DC | PRN
  Administered 2014-04-20: 40 mg via INTRAVENOUS

## 2014-04-20 MED ORDER — ROCURONIUM BROMIDE 50 MG/5ML IV SOLN
INTRAVENOUS | Status: AC
  Filled 2014-04-20: qty 1

## 2014-04-20 MED ORDER — MORPHINE SULFATE 2 MG/ML IJ SOLN: 1.0000 mg | INTRAMUSCULAR | Status: DC | PRN

## 2014-04-20 MED ORDER — HEMOSTATIC AGENTS (NO CHARGE) OPTIME
TOPICAL | Status: DC | PRN
  Administered 2014-04-20 (×2): 1 via TOPICAL

## 2014-04-20 MED ORDER — DIAZEPAM 5 MG PO TABS
ORAL_TABLET | ORAL | Status: AC
Start: 2014-04-20 — End: 2014-04-20
  Administered 2014-04-20: 5 mg via ORAL
  Filled 2014-04-20: qty 1

## 2014-04-20 MED ORDER — DIAZEPAM 5 MG PO TABS
5.0000 mg | ORAL_TABLET | Freq: Four times a day (QID) | ORAL | Status: DC | PRN
  Administered 2014-04-20 – 2014-04-21 (×4): 5 mg via ORAL
  Filled 2014-04-20 (×3): qty 1

## 2014-04-20 MED ORDER — MIDAZOLAM HCL 5 MG/5ML IJ SOLN
INTRAMUSCULAR | Status: DC | PRN
  Administered 2014-04-20: 2 mg via INTRAVENOUS

## 2014-04-20 MED ORDER — ZOLPIDEM TARTRATE 5 MG PO TABS: 5.0000 mg | ORAL_TABLET | Freq: Every evening | ORAL | Status: DC | PRN

## 2014-04-20 MED ORDER — PROPOFOL INFUSION 10 MG/ML OPTIME
INTRAVENOUS | Status: DC | PRN
  Administered 2014-04-20: 50 ug/kg/min via INTRAVENOUS

## 2014-04-20 MED ORDER — HYDROMORPHONE HCL PF 1 MG/ML IJ SOLN
INTRAMUSCULAR | Status: AC
  Administered 2014-04-20: 0.5 mg via INTRAVENOUS
  Filled 2014-04-20: qty 1

## 2014-04-20 MED ORDER — PHENYLEPHRINE HCL 10 MG/ML IJ SOLN
INTRAMUSCULAR | Status: DC | PRN
  Administered 2014-04-20: 80 ug via INTRAVENOUS

## 2014-04-20 MED ORDER — SODIUM CHLORIDE 0.9 % IJ SOLN
3.0000 mL | Freq: Two times a day (BID) | INTRAMUSCULAR | Status: DC
  Administered 2014-04-20: 3 mL via INTRAVENOUS

## 2014-04-20 MED ORDER — SUCCINYLCHOLINE CHLORIDE 20 MG/ML IJ SOLN
INTRAMUSCULAR | Status: DC | PRN
  Administered 2014-04-20: 100 mg via INTRAVENOUS

## 2014-04-20 MED ORDER — FENTANYL CITRATE 0.05 MG/ML IJ SOLN
INTRAMUSCULAR | Status: AC
  Filled 2014-04-20: qty 5

## 2014-04-20 MED ORDER — FENTANYL CITRATE 0.05 MG/ML IJ SOLN
INTRAMUSCULAR | Status: DC | PRN
  Administered 2014-04-20: 125 ug via INTRAVENOUS
  Administered 2014-04-20: 50 ug via INTRAVENOUS
  Administered 2014-04-20: 75 ug via INTRAVENOUS

## 2014-04-20 MED ORDER — MENTHOL 3 MG MT LOZG
1.0000 | LOZENGE | OROMUCOSAL | Status: DC | PRN
  Filled 2014-04-20: qty 9

## 2014-04-20 MED ORDER — HYDROMORPHONE HCL PF 1 MG/ML IJ SOLN
0.2500 mg | INTRAMUSCULAR | Status: DC | PRN
Start: 2014-04-20 — End: 2014-04-20
  Administered 2014-04-20 (×4): 0.5 mg via INTRAVENOUS

## 2014-04-20 MED ORDER — LORATADINE 10 MG PO TABS
10.0000 mg | ORAL_TABLET | Freq: Every day | ORAL | Status: DC
  Administered 2014-04-20 – 2014-04-21 (×2): 10 mg via ORAL
  Filled 2014-04-20 (×2): qty 1

## 2014-04-20 MED ORDER — ONDANSETRON HCL 4 MG/2ML IJ SOLN: 4.0000 mg | INTRAMUSCULAR | Status: DC | PRN

## 2014-04-20 MED ORDER — THROMBIN 20000 UNITS EX SOLR
CUTANEOUS | Status: AC
  Filled 2014-04-20: qty 20000

## 2014-04-20 MED ORDER — ONDANSETRON HCL 4 MG/2ML IJ SOLN
INTRAMUSCULAR | Status: AC
  Filled 2014-04-20: qty 2

## 2014-04-20 MED ORDER — SENNOSIDES-DOCUSATE SODIUM 8.6-50 MG PO TABS
1.0000 | ORAL_TABLET | Freq: Every evening | ORAL | Status: DC | PRN
  Filled 2014-04-20: qty 1

## 2014-04-20 MED ORDER — THROMBIN 20000 UNITS EX SOLR
CUTANEOUS | Status: DC | PRN
  Administered 2014-04-20: 10:00:00 via TOPICAL

## 2014-04-20 MED ORDER — ALUM & MAG HYDROXIDE-SIMETH 200-200-20 MG/5ML PO SUSP: 30.0000 mL | Freq: Four times a day (QID) | ORAL | Status: DC | PRN

## 2014-04-20 MED ORDER — PHENOL 1.4 % MT LIQD
1.0000 | OROMUCOSAL | Status: DC | PRN
  Filled 2014-04-20: qty 177

## 2014-04-20 MED ORDER — PROPOFOL 10 MG/ML IV BOLUS
INTRAVENOUS | Status: AC
  Filled 2014-04-20: qty 20

## 2014-04-20 MED ORDER — BUPIVACAINE-EPINEPHRINE 0.25% -1:200000 IJ SOLN
INTRAMUSCULAR | Status: DC | PRN
  Administered 2014-04-20: 2 mL

## 2014-04-20 MED ORDER — BUDESONIDE-FORMOTEROL FUMARATE 160-4.5 MCG/ACT IN AERO
2.0000 | INHALATION_SPRAY | Freq: Two times a day (BID) | RESPIRATORY_TRACT | Status: DC
  Administered 2014-04-20 – 2014-04-21 (×2): 2 via RESPIRATORY_TRACT
  Filled 2014-04-20: qty 6

## 2014-04-20 MED ORDER — CHLORHEXIDINE GLUCONATE 4 % EX LIQD
60.0000 mL | Freq: Once | CUTANEOUS | Status: DC
  Filled 2014-04-20: qty 60

## 2014-04-20 MED ORDER — ONDANSETRON HCL 4 MG/2ML IJ SOLN
INTRAMUSCULAR | Status: DC | PRN
  Administered 2014-04-20: 4 mg via INTRAVENOUS

## 2014-04-20 SURGICAL SUPPLY — 76 items
BENZOIN TINCTURE PRP APPL 2/3 (GAUZE/BANDAGES/DRESSINGS) ×3 IMPLANT
BIT DRILL NEURO 2X3.1 SFT TUCH (MISCELLANEOUS) ×2 IMPLANT
BIT DRILL SRG 14X2.2XFLT CHK (BIT) ×2 IMPLANT
BIT DRL SRG 14X2.2XFLT CHK (BIT) ×2
BLADE SURG 15 STRL LF DISP TIS (BLADE) ×2 IMPLANT
BLADE SURG 15 STRL SS (BLADE) ×1
BLADE SURG ROTATE 9660 (MISCELLANEOUS) ×3 IMPLANT
BUR MATCHSTICK NEURO 3.0 LAGG (BURR) IMPLANT
CARTRIDGE OIL MAESTRO DRILL (MISCELLANEOUS) ×2 IMPLANT
CERVICAL PARALLEL MED 7MM (Bone Implant) ×6 IMPLANT
CLSR STERI-STRIP ANTIMIC 1/2X4 (GAUZE/BANDAGES/DRESSINGS) ×3 IMPLANT
CORDS BIPOLAR (ELECTRODE) ×3 IMPLANT
COVER SURGICAL LIGHT HANDLE (MISCELLANEOUS) ×3 IMPLANT
CRADLE DONUT ADULT HEAD (MISCELLANEOUS) ×3 IMPLANT
DIFFUSER DRILL AIR PNEUMATIC (MISCELLANEOUS) ×3 IMPLANT
DRAIN JACKSON RD 7FR 3/32 (WOUND CARE) IMPLANT
DRAPE C-ARM 42X72 X-RAY (DRAPES) ×3 IMPLANT
DRAPE POUCH INSTRU U-SHP 10X18 (DRAPES) ×3 IMPLANT
DRAPE SURG 17X23 STRL (DRAPES) ×9 IMPLANT
DRILL BIT SKYLINE 14MM (BIT) ×1
DRILL NEURO 2X3.1 SOFT TOUCH (MISCELLANEOUS) ×3
DURAPREP 26ML APPLICATOR (WOUND CARE) ×3 IMPLANT
ELECT COATED BLADE 2.86 ST (ELECTRODE) ×3 IMPLANT
ELECT REM PT RETURN 9FT ADLT (ELECTROSURGICAL) ×3
ELECTRODE REM PT RTRN 9FT ADLT (ELECTROSURGICAL) ×2 IMPLANT
EVACUATOR SILICONE 100CC (DRAIN) IMPLANT
GAUZE SPONGE 4X4 16PLY XRAY LF (GAUZE/BANDAGES/DRESSINGS) ×3 IMPLANT
GLOVE BIO SURGEON STRL SZ7 (GLOVE) ×3 IMPLANT
GLOVE BIO SURGEON STRL SZ8 (GLOVE) ×3 IMPLANT
GLOVE BIOGEL PI IND STRL 7.0 (GLOVE) ×4 IMPLANT
GLOVE BIOGEL PI IND STRL 8 (GLOVE) ×2 IMPLANT
GLOVE BIOGEL PI INDICATOR 7.0 (GLOVE) ×2
GLOVE BIOGEL PI INDICATOR 8 (GLOVE) ×1
GOWN STRL REUS W/ TWL LRG LVL3 (GOWN DISPOSABLE) ×2 IMPLANT
GOWN STRL REUS W/ TWL XL LVL3 (GOWN DISPOSABLE) ×2 IMPLANT
GOWN STRL REUS W/TWL LRG LVL3 (GOWN DISPOSABLE) ×1
GOWN STRL REUS W/TWL XL LVL3 (GOWN DISPOSABLE) ×1
IV CATH 14GX2 1/4 (CATHETERS) ×3 IMPLANT
KIT BASIN OR (CUSTOM PROCEDURE TRAY) ×3 IMPLANT
KIT ROOM TURNOVER OR (KITS) ×3 IMPLANT
MANIFOLD NEPTUNE II (INSTRUMENTS) IMPLANT
NEEDLE 27GAX1X1/2 (NEEDLE) ×3 IMPLANT
NEEDLE SPNL 20GX3.5 QUINCKE YW (NEEDLE) ×3 IMPLANT
NEURO MONITORING STIM (LABOR (TRAVEL & OVERTIME)) ×3 IMPLANT
NS IRRIG 1000ML POUR BTL (IV SOLUTION) ×3 IMPLANT
OIL CARTRIDGE MAESTRO DRILL (MISCELLANEOUS) ×3
PACK ORTHO CERVICAL (CUSTOM PROCEDURE TRAY) ×3 IMPLANT
PAD ARMBOARD 7.5X6 YLW CONV (MISCELLANEOUS) ×6 IMPLANT
PATTIES SURGICAL .5 X.5 (GAUZE/BANDAGES/DRESSINGS) IMPLANT
PATTIES SURGICAL .5 X1 (DISPOSABLE) IMPLANT
PIN DISTRACTION 14 (PIN) ×6 IMPLANT
PLATE TWO LEVEL SKYLINE 30MM (Plate) ×3 IMPLANT
PUTTY BONE DBX 5CC MIX (Putty) ×3 IMPLANT
SCREW SKYLINE VAR OS 14MM (Screw) ×3 IMPLANT
SCREW VAR SELF TAP SKYLINE 14M (Screw) ×15 IMPLANT
SPONGE GAUZE 4X4 12PLY (GAUZE/BANDAGES/DRESSINGS) ×3 IMPLANT
SPONGE GAUZE 4X4 12PLY STER LF (GAUZE/BANDAGES/DRESSINGS) ×3 IMPLANT
SPONGE INTESTINAL PEANUT (DISPOSABLE) ×3 IMPLANT
SPONGE SURGIFOAM ABS GEL 100 (HEMOSTASIS) IMPLANT
STRIP CLOSURE SKIN 1/2X4 (GAUZE/BANDAGES/DRESSINGS) ×3 IMPLANT
SURGIFLO TRUKIT (HEMOSTASIS) IMPLANT
SURGIFLO W/THROMBIN 8M KIT (HEMOSTASIS) ×6 IMPLANT
SUT MNCRL AB 4-0 PS2 18 (SUTURE) IMPLANT
SUT SILK 4 0 (SUTURE)
SUT SILK 4-0 18XBRD TIE 12 (SUTURE) IMPLANT
SUT VIC AB 2-0 CT2 18 VCP726D (SUTURE) ×3 IMPLANT
SYR BULB IRRIGATION 50ML (SYRINGE) ×3 IMPLANT
SYR CONTROL 10ML LL (SYRINGE) ×6 IMPLANT
TAPE CLOTH 4X10 WHT NS (GAUZE/BANDAGES/DRESSINGS) ×3 IMPLANT
TAPE CLOTH SURG 4X10 WHT LF (GAUZE/BANDAGES/DRESSINGS) ×3 IMPLANT
TAPE UMBILICAL COTTON 1/8X30 (MISCELLANEOUS) ×6 IMPLANT
TOWEL OR 17X24 6PK STRL BLUE (TOWEL DISPOSABLE) ×3 IMPLANT
TOWEL OR 17X26 10 PK STRL BLUE (TOWEL DISPOSABLE) ×3 IMPLANT
TRAY FOLEY CATH 16FRSI W/METER (SET/KITS/TRAYS/PACK) ×3 IMPLANT
WATER STERILE IRR 1000ML POUR (IV SOLUTION) IMPLANT
YANKAUER SUCT BULB TIP NO VENT (SUCTIONS) ×3 IMPLANT

## 2014-04-20 NOTE — Op Note (Signed)
NAME:  Monique Sanchez, Monique Sanchez NO.:  [id number]  MEDICAL RECORD NO.:  [id number]  LOCATION:  [facility]                         FACILITY:  [facility]  PHYSICIAN:  Phylliss Bob, MD      DATE OF BIRTH:  Apr 02, 1951  DATE OF PROCEDURE:  04/20/2014                              OPERATIVE REPORT   PREOPERATIVE DIAGNOSES: 1. Right C5 palsy. 2. Spinal cord compression C4-5, C5-6. 3. Disk herniation C5-6, causing right-sided spinal cord compression. 4. Very large extruded right C4-5 disk herniation migrated behind the     C4 vertebral body, causing substantial compression of the spinal     cord, and exiting C5 nerve on the right.  POSTOPERATIVE DIAGNOSES: 1. Right C5 palsy. 2. Spinal cord compression C4-5, C5-6. 3. Disk herniation C5-6, causing right-sided spinal cord compression. 4. Very large extruded right C4-5 disk herniation migrated behind the     C4 vertebral body, causing substantial compression of the spinal     cord, and exiting C5 nerve on the right.  PROCEDURE: 1. Anterior cervical decompression and fusion C4-5, C5-6. 2. Partial corpectomy involving the posterior and inferior C4     vertebral body. 3. Placement of anterior instrumentation C4-C6. 4. Use of morselized allograft - DBX mix. 5. Insertion of interbody device x2 - Titan interbody spacers. 6. Intraoperative use of fluoroscopy.  SURGEON:  Phylliss Bob, MD.  ASSISTANTPricilla Holm, PA-C.  ANESTHESIA:  General endotracheal anesthesia.  COMPLICATIONS:  None.  DISPOSITION:  Stable.  ESTIMATED BLOOD LOSS:  Minimal.  INDICATIONS FOR PROCEDURE:  Briefly, Ms. Jia is a very pleasant 63- year-old female who did present to me, was started as weakness in the neck, which did progress to profound weakness involving the right shoulder.  An MRI did reveal a very large prominent C4-5 extruded disk herniation, as well as a disk herniation at C5-6.  I did feel that these findings did readily correlate to the  patient's symptoms.  We therefore did discuss proceeding with the procedure reflected above.  The patient did elect to proceed.  OPERATIVE DETAILS:  On April 20, 2014, the patient was brought to surgery and general endotracheal anesthesia was administered.  The patient was placed supine on the hospital bed.  Of note, neurologic monitoring was utilized throughout surgery.  Baseline motor-evoked potentials were obtained.  The neck was prepped and draped in the usual sterile fashion. Time-out procedure was performed.  Of note, all bony prominences were meticulously padded.  A left-sided transverse incision was made, centered over the C5 vertebral body.  The platysma was incised and a Smith-Robinson approach was utilized.  The anterior spine was noted.  A lateral intraoperative view did confirm the appropriate operative level. I then subperiosteally exposed the vertebral bodies of C4, C5, and C6. My attention was then centered over the C5-6 interspace.  A self- retaining retractor was placed and Caspar pins were placed at vertebral bodies above and below.  Distraction was applied.  I then went forward with a standard and a thorough diskectomy.  On the right side, a protrusion was removed.  I was pleased with the decompression that I was able to accomplish.  The 7 mm medium implant was then  packed with DBX mix into position in the usual fashion after preparing the endplates.  A Caspar pins from the lower vertebral body was then removed and placed into the C4 vertebral body.  Distraction was applied.  I then again went forward with a thorough diskectomy.  Of note, the patient's MRI did reveal a substantial portion of disk material located behind the C4 vertebral body.  Given this, I did liberally used a high-speed bur to remove approximately the posterior one-third of the C4 vertebral body, in order to gain access to the herniated disk fragment behind it.  Thus, a partial corpectomy was  performed.  I then gained access through the posterior longitudinal ligament.  A very large and prominent herniated disk fragment was noted.  The large herniated disk fragment was removed in multiple segments.  I was able to confirm adequate decompression of both the spinal cord, and the exiting C5 nerve on the right.  There was brisk venous bleeding encountered as the disk material was removed, this was controlled using Surgiflo.  I then prepared the anterior aspect of the endplates, anterior to where the corpectomy was performed.  I then placed a 7 mm Titan interbody spacer, which was packed with DBX mix, after preparing the endplates.  I was very pleased with the press fit of each of the implants.  I then chose an appropriate size anterior cervical plate, which was placed over the anterior cervical spine. A 14- mm variable angle screws were placed, 2 in each vertebral bodies from C4- C6 for a total of 6 vertebral body screws.  The screws were then locked to the plate per the manufacturer's recommendations.  The wound was then copiously irrigated.  The platysma was then closed using 2-0 Vicryl and the skin was closed using 3-0 Monocryl.  Benzoin and Steri-Strips were applied, followed by sterile dressing.  All instrument counts were correct at the termination of the procedure.  Of note, Pricilla Holm was my assistant throughout the surgery, I did aid in retraction, suctioning, and closure throughout the surgery.  Also of note, there was no change from baseline with regard to the motor evoked potentials.     Phylliss Bob, MD     MD/MEDQ  D:  04/20/2014  T:  04/20/2014  Job:  [id number]  cc:   Dr. Denny Peon, Nurse Practitioner

## 2014-04-20 NOTE — Transfer of Care (Signed)
Immediate Anesthesia Transfer of Care Note  Patient: Monique Sanchez  Procedure(s) Performed: Procedure(s) with comments: ANTERIOR CERVICAL DECOMPRESSION/DISCECTOMY FUSION 2 LEVELS - Anterior cervical decompression fusion, cervical 4-5, cervical 5-6 with instrumentation and allograft, possible C4 corpectomy.  Patient Location: PACU  Anesthesia Type:General  Level of Consciousness: awake, alert , oriented and patient cooperative  Airway & Oxygen Therapy: Patient Spontanous Breathing and Patient connected to nasal cannula oxygen  Post-op Assessment: Report given to PACU RN, Post -op Vital signs reviewed and stable and Patient moving all extremities X 4  Post vital signs: Reviewed and stable  Complications: No apparent anesthesia complications

## 2014-04-20 NOTE — Progress Notes (Signed)
Orthopedic Tech Progress Note Patient Details:  Monique Sanchez [birth year] [id number]  Ortho Devices Type of Ortho Device: [place] cervical collar Ortho Device/Splint Location: at bed side Ortho Device/Splint Interventions: Criss Alvine 04/20/2014, 4:38 PM

## 2014-04-20 NOTE — Anesthesia Procedure Notes (Signed)
Procedure Name: Intubation Date/Time: 04/20/2014 9:35 AM Performed by: Carney Living Pre-anesthesia Checklist: Patient identified, Emergency Drugs available, Suction available, Patient being monitored and Timeout performed Patient Re-evaluated:Patient Re-evaluated prior to inductionOxygen Delivery Method: Circle system utilized Preoxygenation: Pre-oxygenation with 100% oxygen Intubation Type: IV induction Tube type: Oral Tube size: 7.0 mm Number of attempts: 1 Airway Equipment and Method: Video-laryngoscopy Placement Confirmation: ETT inserted through vocal cords under direct vision,  positive ETCO2 and breath sounds checked- equal and bilateral Secured at: 21 cm Tube secured with: Tape Dental Injury: Teeth and Oropharynx as per pre-operative assessment  Comments: Glide scope utilized due to limited ROM and pain with neck extension, neck neutral during intubation, 7.0 ETT easily placed, BBS=, VSS

## 2014-04-20 NOTE — Anesthesia Postprocedure Evaluation (Signed)
Anesthesia Post Note  Patient: Monique Sanchez  Procedure(s) Performed: Procedure(s): ANTERIOR CERVICAL DECOMPRESSION/DISCECTOMY FUSION 2 LEVELS  Anesthesia type: General  Patient location: PACU  Post pain: Pain level controlled  Post assessment: Patient's Cardiovascular Status Stable  Last Vitals:  Filed Vitals:   04/20/14 1300  BP: 146/73  Pulse: 88  Temp: 36.4 C  Resp: 14    Post vital signs: Reviewed and stable  Level of consciousness: alert  Complications: No apparent anesthesia complications

## 2014-04-20 NOTE — Anesthesia Preprocedure Evaluation (Addendum)
Anesthesia Evaluation  Patient identified by MRN, date of birth, ID band Patient awake    Reviewed: Allergy & Precautions, H&P , NPO status , Patient's Chart, lab work & pertinent test results, reviewed documented beta blocker date and time   Airway Mallampati: II TM Distance: >3 FB Neck ROM: full    Dental  (+) Teeth Intact, Dental Advisory Given   Pulmonary COPD COPD inhaler, former smoker,  breath sounds clear to auscultation        Cardiovascular hypertension, On Medications and On Home Beta Blockers + CAD Rhythm:regular     Neuro/Psych Anxiety negative neurological ROS  negative psych ROS   GI/Hepatic negative GI ROS, Neg liver ROS,   Endo/Other  negative endocrine ROS  Renal/GU negative Renal ROS  negative genitourinary   Musculoskeletal   Abdominal   Peds  Hematology negative hematology ROS (+)   Anesthesia Other Findings See surgeon's H&P   Reproductive/Obstetrics negative OB ROS                         Anesthesia Physical Anesthesia Plan  ASA: III  Anesthesia Plan: General   Post-op Pain Management:    Induction: Intravenous  Airway Management Planned: Oral ETT and Video Laryngoscope Planned  Additional Equipment:   Intra-op Plan:   Post-operative Plan: Extubation in OR  Informed Consent: I have reviewed the patients History and Physical, chart, labs and discussed the procedure including the risks, benefits and alternatives for the proposed anesthesia with the patient or authorized representative who has indicated his/her understanding and acceptance.   Dental Advisory Given and Dental advisory given  Plan Discussed with: CRNA and Surgeon  Anesthesia Plan Comments:       Anesthesia Quick Evaluation

## 2014-04-20 NOTE — Plan of Care (Signed)
Problem: Consults Goal: Diagnosis - Spinal Surgery Outcome: Completed/Met Date Met:  04/20/14 Cervical Spine Fusion

## 2014-04-21 ENCOUNTER — Encounter (HOSPITAL_COMMUNITY): Payer: Self-pay | Admitting: Orthopedic Surgery

## 2014-04-21 NOTE — Progress Notes (Signed)
    Patient doing well Right arm pain resolved   Physical Exam: Filed Vitals:   04/21/14 0400  BP: 108/66  Pulse: 89  Temp: 97.7 F (36.5 C)  Resp: 18    Dressing in place Slight improvement in right deltoid strength at 2/5  POD #1 s/p C4-6 ACDF, doing excellent  - encourage ambulation - Percocet for pain, Valium for muscle spasms - likely d/c home today

## 2014-04-21 NOTE — Progress Notes (Signed)
Patient alert and oriented, mae's well, voiding adequate amount of urine, swallowing without difficulty, no c/o pain. Patient discharged home with family. Script and discharged instructions given to patient. Patient and family stated understanding of instructions given.  

## 2014-04-26 NOTE — Discharge Summary (Signed)
Patient ID: Monique Sanchez MRN: [id number] DOB/AGE: 1951/02/17 64 y.o.  Admit date: 04/20/2014 Discharge date: 04/21/2014  Admission Diagnoses:  Active Problems:   Radiculopathy   Discharge Diagnoses:  Same  Past Medical History  Diagnosis Date  . Hypertension   . Hypercholesteremia   . Seasonal allergies   . Blockage of coronary artery of heart     50% blockage single vessels  . COPD (chronic obstructive pulmonary disease)   . Diverticular disease of colon 2010  . Arthritis   . Osteoarthritis   . Claustrophobia     Surgeries: Procedure(s): ANTERIOR CERVICAL DECOMPRESSION/DISCECTOMY FUSION 2 LEVELS C4-6 on 04/20/2014   Consultants:   None  Discharged Condition: Improved  Hospital Course: Monique Sanchez is an 63 y.o. female who was admitted 04/20/2014 for operative treatment of radiculopathy. Patient has severe unremitting pain that affects sleep, daily activities, and work/hobbies. After pre-op clearance the patient was taken to the operating room on 04/20/2014 and underwent  Procedure(s): ANTERIOR CERVICAL DECOMPRESSION/DISCECTOMY FUSION 2 LEVELS C4-6.    Patient was given perioperative antibiotics:  Anti-infectives   Start     Dose/Rate Route Frequency Ordered Stop   04/20/14 1630  ceFAZolin (ANCEF) IVPB 1 g/50 mL premix     1 g 100 mL/hr over 30 Minutes Intravenous Every 8 hours 04/20/14 1531 04/20/14 2348   04/20/14 0600  ceFAZolin (ANCEF) IVPB 2 g/50 mL premix     2 g 100 mL/hr over 30 Minutes Intravenous On call to O.R. 04/19/14 1448 04/20/14 1000       Patient was given sequential compression devices, early ambulation to prevent DVT.  Patient benefited maximally from hospital stay and there were no complications.    Recent vital signs: BP 146/77  Pulse 86  Temp(Src) 97.6 F (36.4 C) (Oral)  Resp 18  Ht 5\' 2"  (1.575 m)  Wt 60.147 kg (132 lb 9.6 oz)  BMI 24.25 kg/m2  SpO2 96%   Discharge Medications:     Medication List    STOP taking  these medications       HYDROcodone-acetaminophen 5-325 MG per tablet  Commonly known as:  NORCO/VICODIN     traMADol 50 MG tablet  Commonly known as:  ULTRAM      TAKE these medications       AEROCHAMBER MV inhaler  Use as instructed     budesonide-formoterol 160-4.5 MCG/ACT inhaler  Commonly known as:  SYMBICORT  Inhale 2 puffs into the lungs 2 (two) times daily.     carvedilol 10 MG 24 hr capsule  Commonly known as:  COREG CR  Take 10 mg by mouth daily.     cetirizine 10 MG tablet  Commonly known as:  ZYRTEC  Take 10 mg by mouth at bedtime.     hydrochlorothiazide 25 MG tablet  Commonly known as:  HYDRODIURIL  Take 25 mg by mouth daily.     lansoprazole 30 MG capsule  Commonly known as:  PREVACID  Take 30 mg by mouth daily at 12 noon.     OVER THE COUNTER MEDICATION  Phason- otc gas relief- prn     rosuvastatin 5 MG tablet  Commonly known as:  CRESTOR  Take 5 mg by mouth daily.        Diagnostic Studies: Dg Cervical Spine 1 View  04/20/2014   CLINICAL DATA:  C4-C6 ACDF.  EXAM: DG C-ARM 61-120 MIN; DG CERVICAL SPINE - 1 VIEW  TECHNIQUE: Single lateral intraoperative fluoroscopic spot view  FLUOROSCOPY  TIME:  0 min 11 seconds  COMPARISON:  MRI 04/13/2014.  FINDINGS: Fluoroscopic spot film demonstrates C4 through C6 ACDF with interbody bone graft. Endotracheal tube and enteric tube is present anteriorly along with surgical sponge.  IMPRESSION: C4 through C6 ACDF localization.   Electronically Signed   By: Dereck Ligas M.D.   On: 04/20/2014 14:19   Mr Cervical Spine Wo Contrast  04/13/2014   CLINICAL DATA:  Right lateral neck pain and right shoulder pain with weakness and limited range of motion of the right arm.  EXAM: MRI CERVICAL SPINE WITHOUT CONTRAST  TECHNIQUE: Multiplanar, multisequence MR imaging of the cervical spine was performed. No intravenous contrast was administered.  COMPARISON:  None.  FINDINGS: The visualized intracranial contents and paraspinal  soft tissues are normal.  C1-2 and C2-3:  Normal.  C3-4: Small central subligamentous disc protrusion which touches the ventral aspect of the spinal cord but does not compress it.  C4-5: Prominent soft disc extrusion central and to the right extending superiorly and inferiorly from the C4-5 disc space compressing the right ventral aspect of the spinal cord and extending into the right lateral recess. No appreciable myelopathy. The should affect the right C5 nerve.  C5-6: Focal small soft disc protrusion into the right of midline which could affect the right C6 nerve.  C6-7: Small focal central subligamentous disc protrusion with no neural impingement.  C7-T1 through T2-3:  Normal.  There is no facet arthritis in the cervical spine.  IMPRESSION: 1. Large soft disc extrusion at C4-5 central and to the right with slight compression of the spinal cord. 2. Soft disc protrusion into the right lateral recess at C5-6 with slight effect upon the spinal cord and probably compressing the right C6 nerve. 3. Tiny disc protrusions at C3-4 and C6-7 without neural impingement.   Electronically Signed   By: Rozetta Nunnery M.D.   On: 04/13/2014 10:19   Dg C-arm 1-60 Min  04/20/2014   CLINICAL DATA:  C4-C6 ACDF.  EXAM: DG C-ARM 61-120 MIN; DG CERVICAL SPINE - 1 VIEW  TECHNIQUE: Single lateral intraoperative fluoroscopic spot view  FLUOROSCOPY TIME:  0 min 11 seconds  COMPARISON:  MRI 04/13/2014.  FINDINGS: Fluoroscopic spot film demonstrates C4 through C6 ACDF with interbody bone graft. Endotracheal tube and enteric tube is present anteriorly along with surgical sponge.  IMPRESSION: C4 through C6 ACDF localization.   Electronically Signed   By: Dereck Ligas M.D.   On: 04/20/2014 14:19    Disposition: 01-Home or Self Care   POD #1 s/p C4-6 ACDF, doing excellent  -Written scripts for pain signed and in chart -D/C instructions sheet printed and in chart -D/C today  -F/U in office 2 weeks   Signed: Justice Britain 04/26/2014, 12:07 PM

## 2014-05-30 HISTORY — PX: CATARACT EXTRACTION W/ INTRAOCULAR LENS IMPLANT: SHX1309

## 2014-06-29 HISTORY — PX: CATARACT EXTRACTION W/ INTRAOCULAR LENS IMPLANT: SHX1309

## 2014-07-14 ENCOUNTER — Other Ambulatory Visit: Payer: Self-pay

## 2014-07-14 MED ORDER — BUDESONIDE-FORMOTEROL FUMARATE 160-4.5 MCG/ACT IN AERO: 2.0000 | INHALATION_SPRAY | Freq: Two times a day (BID) | RESPIRATORY_TRACT | Status: DC

## 2014-07-14 MED ORDER — TIOTROPIUM BROMIDE MONOHYDRATE 18 MCG IN CAPS: 18.0000 ug | ORAL_CAPSULE | Freq: Every day | RESPIRATORY_TRACT | Status: DC

## 2014-08-14 ENCOUNTER — Other Ambulatory Visit: Payer: Self-pay | Admitting: *Deleted

## 2014-08-14 MED ORDER — CARVEDILOL PHOSPHATE ER 10 MG PO CP24: 10.0000 mg | ORAL_CAPSULE | Freq: Every day | ORAL | Status: DC

## 2014-08-14 MED ORDER — HYDROCHLOROTHIAZIDE 25 MG PO TABS: 25.0000 mg | ORAL_TABLET | Freq: Every day | ORAL | Status: DC

## 2014-08-14 NOTE — Addendum Note (Signed)
Addended by: Wynonia Lawman E on: 08/14/2014 10:58 AM   Modules accepted: Orders

## 2014-08-16 ENCOUNTER — Other Ambulatory Visit: Payer: Self-pay

## 2014-08-16 ENCOUNTER — Encounter: Payer: Self-pay | Admitting: Adult Health

## 2014-08-16 MED ORDER — ROSUVASTATIN CALCIUM 5 MG PO TABS: 5.0000 mg | ORAL_TABLET | Freq: Every day | ORAL | Status: DC

## 2014-08-16 NOTE — Telephone Encounter (Signed)
Patient called requesting a Rx for crestor be sent to pharmacy with a 90 day supply. Former Charolette Forward, NP patient.

## 2014-10-16 ENCOUNTER — Encounter: Payer: Self-pay | Admitting: Family Medicine

## 2014-10-16 ENCOUNTER — Encounter (INDEPENDENT_AMBULATORY_CARE_PROVIDER_SITE_OTHER): Payer: Self-pay

## 2014-10-16 ENCOUNTER — Ambulatory Visit (INDEPENDENT_AMBULATORY_CARE_PROVIDER_SITE_OTHER): Payer: BLUE CROSS/BLUE SHIELD | Admitting: Family Medicine

## 2014-10-16 VITALS — BP 110/74 | HR 72 | Temp 97.9°F | Ht 62.0 in | Wt 138.5 lb

## 2014-10-16 DIAGNOSIS — I10 Essential (primary) hypertension: Secondary | ICD-10-CM

## 2014-10-16 DIAGNOSIS — E785 Hyperlipidemia, unspecified: Secondary | ICD-10-CM

## 2014-10-16 DIAGNOSIS — J449 Chronic obstructive pulmonary disease, unspecified: Secondary | ICD-10-CM

## 2014-10-16 DIAGNOSIS — Z1239 Encounter for other screening for malignant neoplasm of breast: Secondary | ICD-10-CM

## 2014-10-16 LAB — CBC WITH DIFFERENTIAL/PLATELET
Basophils Absolute: 0 10*3/uL (ref 0.0–0.1)
Basophils Relative: 0.2 % (ref 0.0–3.0)
Eosinophils Absolute: 0.1 10*3/uL (ref 0.0–0.7)
Eosinophils Relative: 1.3 % (ref 0.0–5.0)
HCT: 44.1 % (ref 36.0–46.0)
Hemoglobin: 14.6 g/dL (ref 12.0–15.0)
Lymphocytes Relative: 21.2 % (ref 12.0–46.0)
Lymphs Abs: 1.7 10*3/uL (ref 0.7–4.0)
MCHC: 33 g/dL (ref 30.0–36.0)
MCV: 92 fl (ref 78.0–100.0)
Monocytes Absolute: 0.9 10*3/uL (ref 0.1–1.0)
Monocytes Relative: 11.4 % (ref 3.0–12.0)
NEUTROS ABS: 5.1 10*3/uL (ref 1.4–7.7)
Neutrophils Relative %: 65.9 % (ref 43.0–77.0)
Platelets: 278 10*3/uL (ref 150.0–400.0)
RBC: 4.79 Mil/uL (ref 3.87–5.11)
RDW: 12.4 % (ref 11.5–15.5)
WBC: 7.8 10*3/uL (ref 4.0–10.5)

## 2014-10-16 LAB — COMPREHENSIVE METABOLIC PANEL
ALK PHOS: 89 U/L (ref 39–117)
ALT: 19 U/L (ref 0–35)
AST: 22 U/L (ref 0–37)
Albumin: 4 g/dL (ref 3.5–5.2)
BILIRUBIN TOTAL: 1 mg/dL (ref 0.2–1.2)
BUN: 19 mg/dL (ref 6–23)
CHLORIDE: 100 meq/L (ref 96–112)
CO2: 29 meq/L (ref 19–32)
CREATININE: 0.81 mg/dL (ref 0.40–1.20)
Calcium: 9.4 mg/dL (ref 8.4–10.5)
GFR: 75.44 mL/min (ref >60.00)
Glucose, Bld: 119 mg/dL — ABNORMAL HIGH (ref 70–99)
Potassium: 3.9 mEq/L (ref 3.5–5.1)
Sodium: 138 mEq/L (ref 135–145)
Total Protein: 6.6 g/dL (ref 6.0–8.3)

## 2014-10-16 LAB — LIPID PANEL
Cholesterol: 164 mg/dL (ref 0–200)
HDL: 58.9 mg/dL (ref >39.00)
LDL CALC: 77 mg/dL (ref 0–99)
NONHDL: 105.1
Total CHOL/HDL Ratio: 3
Triglycerides: 140 mg/dL (ref 0.0–149.0)
VLDL: 28 mg/dL (ref 0.0–40.0)

## 2014-10-16 LAB — TSH: TSH: 1.37 u[IU]/mL (ref 0.35–4.50)

## 2014-10-16 NOTE — Assessment & Plan Note (Signed)
Asymptomatic on current rx. No changes made.

## 2014-10-16 NOTE — Patient Instructions (Signed)
Great to see you. We will call you with your lab results.  On your way out, please schedule a complete physical and pap smear.

## 2014-10-16 NOTE — Assessment & Plan Note (Signed)
Well controlled.  No changes made. 

## 2014-10-16 NOTE — Progress Notes (Signed)
Subjective:   Patient ID: Monique Sanchez, female    DOB: 1951/02/21, 64 y.o.   MRN: [id number]  Monique Sanchez is a pleasant 64 y.o. year old female who presents to clinic today with [place]  on 10/16/2014  HPI:  COPD- followed by Dr. Lake Bells- taking symbicort daily and prn Spriva. Quit smoking 6 years ago.  Feels good- no recent URI symptoms, cough or SOB.  HLD- has been taking Crestor 5 mg daily for years.  Not sure when she last had cholesterol checked.  HTN- well controlled on HCTZ 25 mg daily and Coreg Cr 10 mg daily. Denies HA, blurred vision, or LE edema.  Overdue for pap smear, mammogram.  Diverticulosis- due for colonoscopy- sees Dr. Vira Agar.   Had anterior cervical decompression and fusion C4-5, C5- 6 by Dr. Lynann Bologna on 04/20/14- Op note reviewed. Back pain has improved. Current Outpatient Prescriptions on File Prior to Visit  Medication Sig Dispense Refill  . budesonide-formoterol (SYMBICORT) 160-4.5 MCG/ACT inhaler Inhale 2 puffs into the lungs 2 (two) times daily. 3 Inhaler 1  . carvedilol (COREG CR) 10 MG 24 hr capsule Take 1 capsule (10 mg total) by mouth daily. 90 capsule 1  . cetirizine (ZYRTEC) 10 MG tablet Take 10 mg by mouth at bedtime.    . hydrochlorothiazide (HYDRODIURIL) 25 MG tablet Take 1 tablet (25 mg total) by mouth daily. 90 tablet 1  . lansoprazole (PREVACID) 30 MG capsule Take 30 mg by mouth daily at 12 noon.    Marland Kitchen OVER THE COUNTER MEDICATION Phason- otc gas relief- prn    . rosuvastatin (CRESTOR) 5 MG tablet Take 1 tablet (5 mg total) by mouth daily. 90 tablet 1  . Spacer/Aero-Holding Chambers (AEROCHAMBER MV) inhaler Use as instructed 1 each 0  . tiotropium (SPIRIVA) 18 MCG inhalation capsule Place 1 capsule (18 mcg total) into inhaler and inhale daily. (Patient taking differently: Place 18 mcg into inhaler and inhale daily as needed. ) 90 capsule 1   No current facility-administered medications on file prior to visit.    Allergies    Allergen Reactions  . Influenza Vaccines     Muscle cramps, shaking    Past Medical History  Diagnosis Date  . Hypertension   . Hypercholesteremia   . Seasonal allergies   . Blockage of coronary artery of heart     50% blockage single vessels  . COPD (chronic obstructive pulmonary disease)   . Diverticular disease of colon 2010  . Arthritis   . Osteoarthritis   . Claustrophobia     Past Surgical History  Procedure Laterality Date  . Cholecystectomy  2011  . Ablation on endometriosis  1976  . Foot surgery  1995  . Cataract extraction w/ intraocular lens implant Left 05/2014  . Cataract extraction w/ intraocular lens implant Right 06/2014    right corneal meltdown. Is s/p special procedure where eye is glued.  . Cardiac catheterization  2010    [facility]; Dr. Josefa Half  . Colon resection      for diverticulitis  . Colon surgery Left 2010  . Anterior cervical decomp/discectomy fusion  04/20/2014    Procedure: ANTERIOR CERVICAL DECOMPRESSION/DISCECTOMY FUSION 2 LEVELS;  Surgeon: Sinclair Ship, MD;  Location: [place];  Service: Orthopedics;;  Anterior cervical decompression fusion, cervical 4-5, cervical 5-6 with instrumentation and allograft, possible C4 corpectomy.    Family History  Problem Relation Age of Onset  . Heart disease Mother   . Cancer Mother  lung metastatic site  . Heart disease Father   . Cancer Brother     lung  . Hyperlipidemia Brother   . Hypertension Brother     History   Social History  . Marital Status: Single    Spouse Name: N/A    Number of Children: N/A  . Years of Education: N/A   Occupational History  . Not on file.   Social History Main Topics  . Smoking status: Former Smoker -- 0.50 packs/day for 30 years    Types: Cigarettes    Quit date: 09/30/2007  . Smokeless tobacco: Never Used  . Alcohol Use: 0.0 oz/week    0 Not specified per week     Comment: occasional wine  . Drug Use: No  . Sexual Activity: Not on  file   Other Topics Concern  . Not on file   Social History Narrative   The PMH, PSH, Social History, Family History, Medications, and allergies have been reviewed in [facility], and have been updated if relevant.   Review of Systems  Constitutional: Negative.   HENT: Negative.   Eyes: Negative.   Respiratory: Negative.   Cardiovascular: Negative.   Gastrointestinal: Negative.   Endocrine: Negative.   Genitourinary: Negative.   Musculoskeletal: Negative.   Skin: Negative.   Allergic/Immunologic: Negative.   Neurological: Negative.   Hematological: Negative.   Psychiatric/Behavioral: Negative.   All other systems reviewed and are negative.      Objective:    BP 110/74 mmHg  Pulse 72  Temp(Src) 97.9 F (36.6 C) (Oral)  Ht 5\' 2"  (1.575 m)  Wt 138 lb 8 oz (62.823 kg)  BMI 25.33 kg/m2   Physical Exam   General:  Well-developed,well-nourished,in no acute distress; alert,appropriate and cooperative throughout examination Head:  normocephalic and atraumatic.   Eyes:  vision grossly intact, pupils equal, pupils round, and pupils reactive to light.   Ears:  R ear normal and L ear normal.   Nose:  no external deformity.   Mouth:  good dentition.   Lungs:  Normal respiratory effort, chest expands symmetrically. Lungs are clear to auscultation, no crackles or wheezes. Heart:  Normal rate and regular rhythm. S1 and S2 normal without gallop, murmur, click, rub or other extra sounds. Abdomen:  Bowel sounds positive,abdomen soft and non-tender without masses, organomegaly or hernias noted. Msk:  No deformity or scoliosis noted of thoracic or lumbar spine.   Extremities:  No clubbing, cyanosis, edema, or deformity noted with normal full range of motion of all joints.   Neurologic:  alert & oriented X3 and gait normal.   Skin:  Intact without suspicious lesions or rashes Cervical Nodes:  No lymphadenopathy noted Axillary Nodes:  No palpable lymphadenopathy Psych:  Cognition and  judgment appear intact. Alert and cooperative with normal attention span and concentration. No apparent delusions, illusions, hallucinations       Assessment & Plan:   COPD Grade C - Plan: TSH, CBC with Differential  Essential hypertension  HLD (hyperlipidemia) - Plan: Lipid panel, Comprehensive metabolic panel  Screening for breast cancer - Plan: MM Digital Screening No Follow-up on file.

## 2014-10-16 NOTE — Progress Notes (Signed)
Pre visit review using our clinic review tool, if applicable. No additional management support is needed unless otherwise documented below in the visit note. 

## 2014-10-16 NOTE — Assessment & Plan Note (Signed)
Due for labs, will check today. Advised to make appt for CPX/pap on her way out. The patient indicates understanding of these issues and agrees with the plan.  Orders Placed This Encounter  Procedures  . MM Digital Screening  . Lipid panel  . Comprehensive metabolic panel  . TSH  . CBC with Differential

## 2014-10-17 ENCOUNTER — Telehealth: Payer: Self-pay | Admitting: Family Medicine

## 2014-10-17 ENCOUNTER — Encounter: Payer: Self-pay | Admitting: *Deleted

## 2014-10-17 NOTE — Telephone Encounter (Signed)
emmi emailed °

## 2014-11-01 ENCOUNTER — Ambulatory Visit (INDEPENDENT_AMBULATORY_CARE_PROVIDER_SITE_OTHER): Payer: BLUE CROSS/BLUE SHIELD | Admitting: Family Medicine

## 2014-11-01 ENCOUNTER — Encounter: Payer: Self-pay | Admitting: Pulmonary Disease

## 2014-11-01 ENCOUNTER — Other Ambulatory Visit (HOSPITAL_COMMUNITY)
Admission: RE | Admit: 2014-11-01 | Discharge: 2014-11-01 | Disposition: A | Discharge status: Home / Self Care | Payer: BLUE CROSS/BLUE SHIELD | Source: Ambulatory Visit | Attending: Family Medicine | Admitting: Family Medicine

## 2014-11-01 ENCOUNTER — Encounter: Payer: Self-pay | Admitting: Family Medicine

## 2014-11-01 ENCOUNTER — Ambulatory Visit (INDEPENDENT_AMBULATORY_CARE_PROVIDER_SITE_OTHER): Payer: BLUE CROSS/BLUE SHIELD | Admitting: Pulmonary Disease

## 2014-11-01 VITALS — BP 116/78 | HR 75 | Temp 97.9°F | Ht 62.25 in | Wt 138.8 lb

## 2014-11-01 VITALS — BP 116/62 | HR 74 | Ht 62.5 in | Wt 139.0 lb

## 2014-11-01 DIAGNOSIS — Z1239 Encounter for other screening for malignant neoplasm of breast: Secondary | ICD-10-CM

## 2014-11-01 DIAGNOSIS — Z01419 Encounter for gynecological examination (general) (routine) without abnormal findings: Secondary | ICD-10-CM

## 2014-11-01 DIAGNOSIS — Z13820 Encounter for screening for osteoporosis: Secondary | ICD-10-CM

## 2014-11-01 DIAGNOSIS — I10 Essential (primary) hypertension: Secondary | ICD-10-CM

## 2014-11-01 DIAGNOSIS — J449 Chronic obstructive pulmonary disease, unspecified: Secondary | ICD-10-CM

## 2014-11-01 DIAGNOSIS — E785 Hyperlipidemia, unspecified: Secondary | ICD-10-CM

## 2014-11-01 DIAGNOSIS — Z1151 Encounter for screening for human papillomavirus (HPV): Secondary | ICD-10-CM | POA: Insufficient documentation

## 2014-11-01 DIAGNOSIS — R739 Hyperglycemia, unspecified: Secondary | ICD-10-CM

## 2014-11-01 DIAGNOSIS — Z0000 Encounter for general adult medical examination without abnormal findings: Secondary | ICD-10-CM

## 2014-11-01 LAB — HEMOGLOBIN A1C: Hgb A1c MFr Bld: 6.8 % — ABNORMAL HIGH (ref 4.6–6.5)

## 2014-11-01 NOTE — Patient Instructions (Signed)
Keep taking your medications as you are doing (Sybmicort, you can stop Spiriva as long as you don't feel more short of breath off of it) Exercise regularly no matter how you feel We will see you back in one year or sooner if needed

## 2014-11-01 NOTE — Progress Notes (Signed)
Pre visit review using our clinic review tool, if applicable. No additional management support is needed unless otherwise documented below in the visit note. 

## 2014-11-01 NOTE — Assessment & Plan Note (Signed)
This has been a stable interval for [name]. Despite her severe airflow obstruction she has minimal symptoms. I would like for her to start exercising more regularly. She refuses the pneumonia shot for fear of an allergic reaction. She does have an allergy to the flu shot.  Plan: -Continue Symbicort -I advised that she can discontinue Spriva if she does not have worsening shortness of breath off of it -I have advised frequent handwashing in lieu of immunizations -I advised regular exercise today.  Follow-up one year

## 2014-11-01 NOTE — Assessment & Plan Note (Signed)
Normotensive.  No changes made. 

## 2014-11-01 NOTE — Assessment & Plan Note (Signed)
Deteriorated. Discussed tx options- she is motivated to work on diet. Copy of Eat Right Diet given. Will check a1c today and again in 3 months. May need to consider Metformin. The patient indicates understanding of these issues and agrees with the plan.

## 2014-11-01 NOTE — Assessment & Plan Note (Signed)
Well controlled on low dose Crestor. No changes made.

## 2014-11-01 NOTE — Progress Notes (Signed)
Subjective:    Patient ID: Monique Sanchez, female    DOB: [birth year], 64 y.o.   MRN: [id number]  Synopsis: First saw [facility] for GOLD Grade C COPD in 2015 01/2014 Simple spirometry> Ratio 36%, FEV1  0.54L  (24% pred)  HPI  Chief Complaint  Patient presents with  . Follow-up    pt has no breathing complaints at this time. CAT score 7.    Monique Sanchez had a good holiday season and stayed busy at work because thi time of year is busy for her.  She says that her breathing is OK.  No problems with cough at all.  She is not exercising regularly.  She says that she has not been sick, no bronchitis.    Past Medical History  Diagnosis Date  . Hypertension   . Hypercholesteremia   . Seasonal allergies   . Blockage of coronary artery of heart     50% blockage single vessels  . COPD (chronic obstructive pulmonary disease)   . Diverticular disease of colon 2010  . Arthritis   . Osteoarthritis   . Claustrophobia      Review of Systems  Constitutional: Negative for fever, chills and fatigue.  HENT: Positive for postnasal drip. Negative for nosebleeds and rhinorrhea.   Respiratory: Negative for cough, shortness of breath and wheezing.   Cardiovascular: Negative for chest pain, palpitations and leg swelling.       Objective:   Physical Exam  Filed Vitals:   11/01/14 1129  BP: 116/62  Pulse: 74  Height: 5' 2.5" (1.588 m)  Weight: 139 lb (63.05 kg)  SpO2: 100%  RA  Gen: well appearing, no acute distress HEENT: NCAT, EOMi, OP clear, PULM: Limited air movement but no wheezing CV: RRR, no mgr, no JVD AB: BS+, soft, nontender,  Ext: warm, no edema, no clubbing, no cyanosis     Assessment & Plan:   COPD Grade C This has been a stable interval for [name]. Despite her severe airflow obstruction she has minimal symptoms. I would like for her to start exercising more regularly. She refuses the pneumonia shot for fear of an allergic reaction. She does have an allergy to the flu  shot.  Plan: -Continue Symbicort -I advised that she can discontinue Spriva if she does not have worsening shortness of breath off of it -I have advised frequent handwashing in lieu of immunizations -I advised regular exercise today.  Follow-up one year     Updated Medication List Outpatient Encounter Prescriptions as of 11/01/2014  Medication Sig  . acetaminophen (TYLENOL) 500 MG tablet Take 500 mg by mouth daily as needed.  . budesonide-formoterol (SYMBICORT) 160-4.5 MCG/ACT inhaler Inhale 2 puffs into the lungs 2 (two) times daily.  . carvedilol (COREG CR) 10 MG 24 hr capsule Take 1 capsule (10 mg total) by mouth daily.  . cetirizine (ZYRTEC) 10 MG tablet Take 10 mg by mouth at bedtime.  . hydrochlorothiazide (HYDRODIURIL) 25 MG tablet Take 1 tablet (25 mg total) by mouth daily.  . lansoprazole (PREVACID) 30 MG capsule Take 30 mg by mouth daily at 12 noon.  Marland Kitchen OVER THE COUNTER MEDICATION Phason- otc gas relief- prn  . rosuvastatin (CRESTOR) 5 MG tablet Take 1 tablet (5 mg total) by mouth daily.  Marland Kitchen Spacer/Aero-Holding Chambers (AEROCHAMBER MV) inhaler Use as instructed  . tiotropium (SPIRIVA) 18 MCG inhalation capsule Place 1 capsule (18 mcg total) into inhaler and inhale daily. (Patient taking differently: Place 18 mcg into inhaler and inhale daily  as needed. )

## 2014-11-01 NOTE — Progress Notes (Signed)
Subjective:   Patient ID: Monique Sanchez, female    DOB: 08-22-1951, 64 y.o.   MRN: [id number]  Monique Sanchez is a pleasant 64 y.o. year old female who presents to clinic today with Annual Exam  on 11/01/2014  HPI:  Established care with me last month.  Mammogram 09/30/11 Tdap 03/01/14 Allergic to influenza vaccine- concerned about pneumococcal vaccines and other vaccines because of this- has not received them. Diverticulosis- due for colonoscopy- sees Dr. Vira Agar. Due for pap smear - has been "years" since she had one.  No h/o post menopausal bleeding. No family history of breast, uterine, or cervical CA.  COPD- followed by Dr. Lake Bells- taking symbicort daily and prn Spriva. Quit smoking 6 years ago.  Feels good- no recent URI symptoms, cough or SOB.  HLD- has been taking Crestor 5 mg daily for years.  Not sure when she last had cholesterol checked.  Lab Results  Component Value Date   CHOL 164 10/16/2014   HDL 58.90 10/16/2014   LDLCALC 77 10/16/2014   TRIG 140.0 10/16/2014   CHOLHDL 3 10/16/2014   Lab Results  Component Value Date   ALT 19 10/16/2014   AST 22 10/16/2014   ALKPHOS 89 10/16/2014   BILITOT 1.0 10/16/2014     HTN- well controlled on HCTZ 25 mg daily and Coreg Cr 10 mg daily. Denies HA, blurred vision, or LE edema.  Lab Results  Component Value Date   CREATININE 0.81 10/16/2014     Hyperglycemia- fasting glucose elevated this month and appears to have been slowly increasing over months.  a1c was 6.2 in 03/2014.  No family h/o DM. Denies increased thirst or urination.    Current Outpatient Prescriptions on File Prior to Visit  Medication Sig Dispense Refill  . acetaminophen (TYLENOL) 500 MG tablet Take 500 mg by mouth daily as needed.    . budesonide-formoterol (SYMBICORT) 160-4.5 MCG/ACT inhaler Inhale 2 puffs into the lungs 2 (two) times daily. 3 Inhaler 1  . carvedilol (COREG CR) 10 MG 24 hr capsule Take 1 capsule (10 mg total) by mouth  daily. 90 capsule 1  . cetirizine (ZYRTEC) 10 MG tablet Take 10 mg by mouth at bedtime.    . hydrochlorothiazide (HYDRODIURIL) 25 MG tablet Take 1 tablet (25 mg total) by mouth daily. 90 tablet 1  . lansoprazole (PREVACID) 30 MG capsule Take 30 mg by mouth daily at 12 noon.    Marland Kitchen OVER THE COUNTER MEDICATION Phason- otc gas relief- prn    . rosuvastatin (CRESTOR) 5 MG tablet Take 1 tablet (5 mg total) by mouth daily. 90 tablet 1  . Spacer/Aero-Holding Chambers (AEROCHAMBER MV) inhaler Use as instructed 1 each 0  . tiotropium (SPIRIVA) 18 MCG inhalation capsule Place 1 capsule (18 mcg total) into inhaler and inhale daily. (Patient taking differently: Place 18 mcg into inhaler and inhale daily as needed. ) 90 capsule 1   No current facility-administered medications on file prior to visit.    Allergies  Allergen Reactions  . Influenza Vaccines     Muscle cramps, shaking    Past Medical History  Diagnosis Date  . Hypertension   . Hypercholesteremia   . Seasonal allergies   . Blockage of coronary artery of heart     50% blockage single vessels  . COPD (chronic obstructive pulmonary disease)   . Diverticular disease of colon 2010  . Arthritis   . Osteoarthritis   . Claustrophobia     Past Surgical History  Procedure Laterality Date  . Cholecystectomy  2011  . Ablation on endometriosis  1976  . Foot surgery  1995  . Cataract extraction w/ intraocular lens implant Left 05/2014  . Cataract extraction w/ intraocular lens implant Right 06/2014    right corneal meltdown. Is s/p special procedure where eye is glued.  . Cardiac catheterization  2010    [facility]; Dr. Josefa Half  . Colon resection      for diverticulitis  . Colon surgery Left 2010  . Anterior cervical decomp/discectomy fusion  04/20/2014    Procedure: ANTERIOR CERVICAL DECOMPRESSION/DISCECTOMY FUSION 2 LEVELS;  Surgeon: Sinclair Ship, MD;  Location: [place];  Service: Orthopedics;;  Anterior cervical  decompression fusion, cervical 4-5, cervical 5-6 with instrumentation and allograft, possible C4 corpectomy.    Family History  Problem Relation Age of Onset  . Heart disease Mother   . Cancer Mother     lung metastatic site  . Heart disease Father   . Cancer Brother     lung  . Hyperlipidemia Brother   . Hypertension Brother     History   Social History  . Marital Status: Single    Spouse Name: N/A    Number of Children: N/A  . Years of Education: N/A   Occupational History  . Not on file.   Social History Main Topics  . Smoking status: Former Smoker -- 0.50 packs/day for 30 years    Types: Cigarettes    Quit date: 09/30/2007  . Smokeless tobacco: Never Used  . Alcohol Use: 0.0 oz/week    0 Not specified per week     Comment: occasional wine  . Drug Use: No  . Sexual Activity: Not on file   Other Topics Concern  . Not on file   Social History Narrative   The PMH, PSH, Social History, Family History, Medications, and allergies have been reviewed in [facility], and have been updated if relevant.   Review of Systems  Constitutional: Negative.   HENT: Negative.   Eyes: Negative.   Respiratory: Negative.   Cardiovascular: Negative.   Gastrointestinal: Negative.   Endocrine: Negative.   Genitourinary: Negative.   Musculoskeletal: Negative.   Skin: Negative.   Allergic/Immunologic: Negative.   Neurological: Negative.   Hematological: Negative.   Psychiatric/Behavioral: Negative.   All other systems reviewed and are negative.      Objective:    BP 116/78 mmHg  Pulse 75  Temp(Src) 97.9 F (36.6 C) (Oral)  Ht 5' 2.25" (1.581 m)  Wt 138 lb 12 oz (62.937 kg)  BMI 25.18 kg/m2  SpO2 98%   Physical Exam  General:  Well-developed,well-nourished,in no acute distress; alert,appropriate and cooperative throughout examination Head:  normocephalic and atraumatic.   Eyes:  vision grossly intact, pupils equal, pupils round, and pupils reactive to light.   Ears:  R  ear normal and L ear normal.   Nose:  no external deformity.   Mouth:  good dentition.   Neck:  No deformities, masses, or tenderness noted. Breasts:  No mass, nodules, thickening, tenderness, bulging, retraction, inflamation, nipple discharge or skin changes noted.   Lungs:  Normal respiratory effort, chest expands symmetrically. Lungs are clear to auscultation, no crackles or wheezes. Heart:  Normal rate and regular rhythm. S1 and S2 normal without gallop, murmur, click, rub or other extra sounds. Abdomen:  Bowel sounds positive,abdomen soft and non-tender without masses, organomegaly or hernias noted. Rectal:  no external abnormalities.   Genitalia:  Pelvic Exam:  External: normal female genitalia without lesions or masses        Vagina: normal without lesions or masses        Cervix: normal without lesions or masses        Adnexa: normal bimanual exam without masses or fullness        Uterus: normal by palpation        Pap smear: performed Msk:  No deformity or scoliosis noted of thoracic or lumbar spine.   Extremities:  No clubbing, cyanosis, edema, or deformity noted with normal full range of motion of all joints.   Neurologic:  alert & oriented X3 and gait normal.   Skin:  Intact without suspicious lesions or rashes Cervical Nodes:  No lymphadenopathy noted Axillary Nodes:  No palpable lymphadenopathy Psych:  Cognition and judgment appear intact. Alert and cooperative with normal attention span and concentration. No apparent delusions, illusions, hallucinations       Assessment & Plan:   Well woman exam with routine gynecological exam  Hyperglycemia - Plan: Hemoglobin A1c  HLD (hyperlipidemia) No Follow-up on file.

## 2014-11-01 NOTE — Addendum Note (Signed)
Addended by: Modena Nunnery on: 11/01/2014 10:25 AM   Modules accepted: Orders

## 2014-11-01 NOTE — Assessment & Plan Note (Signed)
Reviewed preventive care protocols, scheduled due services, and updated immunizations Discussed nutrition, exercise, diet, and healthy lifestyle.  Mammogram and DEXA ordered. Declined immunzations.  She will call Dr. Vira Agar to schedule recall colonoscopy.

## 2014-11-01 NOTE — Patient Instructions (Addendum)
Good to see you. Please call Dr. Vira Agar to set up your colonosocpy appt.  Please call [facility] to set up your mammogram and bone density.   Please start the Eat Right Diet- drink more water like we discussed today.

## 2014-11-03 ENCOUNTER — Telehealth: Payer: Self-pay | Admitting: Family Medicine

## 2014-11-03 LAB — CYTOLOGY - PAP

## 2014-11-03 NOTE — Telephone Encounter (Signed)
See result note.  

## 2014-11-03 NOTE — Telephone Encounter (Signed)
Pt returned your call.  

## 2014-11-06 ENCOUNTER — Encounter: Payer: Self-pay | Admitting: *Deleted

## 2014-11-07 ENCOUNTER — Encounter: Payer: Self-pay | Admitting: Family Medicine

## 2014-11-07 ENCOUNTER — Ambulatory Visit (INDEPENDENT_AMBULATORY_CARE_PROVIDER_SITE_OTHER): Payer: BLUE CROSS/BLUE SHIELD | Admitting: Family Medicine

## 2014-11-07 VITALS — BP 116/66 | HR 73 | Temp 98.2°F | Wt 138.2 lb

## 2014-11-07 DIAGNOSIS — E119 Type 2 diabetes mellitus without complications: Secondary | ICD-10-CM | POA: Insufficient documentation

## 2014-11-07 DIAGNOSIS — E785 Hyperlipidemia, unspecified: Secondary | ICD-10-CM

## 2014-11-07 MED ORDER — METFORMIN HCL 500 MG PO TABS: 500.0000 mg | ORAL_TABLET | Freq: Every day | ORAL | Status: DC

## 2014-11-07 NOTE — Progress Notes (Signed)
Subjective:   Patient ID: Monique Sanchez, female    DOB: [birth year], 64 y.o.   MRN: [id number]  Monique Sanchez is a pleasant 64 y.o. year old female who presents to clinic today with Follow-up  on 11/07/2014  HPI: New onset diabetes- a1c 6.8. Has noticed more fatigue and intermittent shakiness if she skips a meal.  No increased thirst or urination. Does like to eat breads and pastas.  Does not really crave or eat a lot of sweets. On Crestor.  Lab Results  Component Value Date   HGBA1C 6.8* 11/01/2014   - Lab Results  Component Value Date   CHOL 164 10/16/2014   HDL 58.90 10/16/2014   LDLCALC 77 10/16/2014   TRIG 140.0 10/16/2014   CHOLHDL 3 10/16/2014   Current Outpatient Prescriptions on File Prior to Visit  Medication Sig Dispense Refill  . acetaminophen (TYLENOL) 500 MG tablet Take 500 mg by mouth daily as needed.    . budesonide-formoterol (SYMBICORT) 160-4.5 MCG/ACT inhaler Inhale 2 puffs into the lungs 2 (two) times daily. 3 Inhaler 1  . carvedilol (COREG CR) 10 MG 24 hr capsule Take 1 capsule (10 mg total) by mouth daily. 90 capsule 1  . cetirizine (ZYRTEC) 10 MG tablet Take 10 mg by mouth at bedtime.    . hydrochlorothiazide (HYDRODIURIL) 25 MG tablet Take 1 tablet (25 mg total) by mouth daily. 90 tablet 1  . lansoprazole (PREVACID) 30 MG capsule Take 30 mg by mouth daily at 12 noon.    Marland Kitchen OVER THE COUNTER MEDICATION Phason- otc gas relief- prn    . rosuvastatin (CRESTOR) 5 MG tablet Take 1 tablet (5 mg total) by mouth daily. 90 tablet 1  . Spacer/Aero-Holding Chambers (AEROCHAMBER MV) inhaler Use as instructed 1 each 0  . tiotropium (SPIRIVA) 18 MCG inhalation capsule Place 1 capsule (18 mcg total) into inhaler and inhale daily. (Patient taking differently: Place 18 mcg into inhaler and inhale daily as needed. ) 90 capsule 1   No current facility-administered medications on file prior to visit.    Allergies  Allergen Reactions  . Influenza Vaccines    Muscle cramps, shaking    Past Medical History  Diagnosis Date  . Hypertension   . Hypercholesteremia   . Seasonal allergies   . Blockage of coronary artery of heart     50% blockage single vessels  . COPD (chronic obstructive pulmonary disease)   . Diverticular disease of colon 2010  . Arthritis   . Osteoarthritis   . Claustrophobia     Past Surgical History  Procedure Laterality Date  . Cholecystectomy  2011  . Ablation on endometriosis  1976  . Foot surgery  1995  . Cataract extraction w/ intraocular lens implant Left 05/2014  . Cataract extraction w/ intraocular lens implant Right 06/2014    right corneal meltdown. Is s/p special procedure where eye is glued.  . Cardiac catheterization  2010    [facility]; Dr. Josefa Half  . Colon resection      for diverticulitis  . Colon surgery Left 2010  . Anterior cervical decomp/discectomy fusion  04/20/2014    Procedure: ANTERIOR CERVICAL DECOMPRESSION/DISCECTOMY FUSION 2 LEVELS;  Surgeon: Sinclair Ship, MD;  Location: [place];  Service: Orthopedics;;  Anterior cervical decompression fusion, cervical 4-5, cervical 5-6 with instrumentation and allograft, possible C4 corpectomy.    Family History  Problem Relation Age of Onset  . Heart disease Mother   . Cancer Mother     lung  metastatic site  . Heart disease Father   . Cancer Brother     lung  . Hyperlipidemia Brother   . Hypertension Brother     History   Social History  . Marital Status: Single    Spouse Name: N/A    Number of Children: N/A  . Years of Education: N/A   Occupational History  . Not on file.   Social History Main Topics  . Smoking status: Former Smoker -- 0.50 packs/day for 30 years    Types: Cigarettes    Quit date: 09/30/2007  . Smokeless tobacco: Never Used  . Alcohol Use: 0.0 oz/week    0 Not specified per week     Comment: occasional wine  . Drug Use: No  . Sexual Activity: Not on file   Other Topics Concern  . Not on file    Social History Narrative   The PMH, PSH, Social History, Family History, Medications, and allergies have been reviewed in [facility], and have been updated if relevant.   Review of Systems  Constitutional: Positive for fatigue.  Eyes: Negative.   Endocrine: Negative.   Musculoskeletal: Negative.   Skin: Negative.   Hematological: Negative.   Psychiatric/Behavioral: Negative.   All other systems reviewed and are negative.      Objective:    BP 116/66 mmHg  Pulse 73  Temp(Src) 98.2 F (36.8 C) (Oral)  Wt 138 lb 4 oz (62.71 kg)  SpO2 95%   Physical Exam  Constitutional: She is oriented to person, place, and time. She appears well-developed and well-nourished. No distress.  HENT:  Head: Normocephalic.  Eyes: Conjunctivae are normal.  Cardiovascular: Normal rate.   Pulmonary/Chest: Effort normal.  Neurological: She is alert and oriented to person, place, and time. No cranial nerve deficit.  Skin: Skin is warm and dry.  Psychiatric: She has a normal mood and affect. Her behavior is normal. Judgment and thought content normal.  Nursing note and vitals reviewed.         Assessment & Plan:   New onset type 2 diabetes mellitus - Plan: Ambulatory referral to diabetic education No Follow-up on file.

## 2014-11-07 NOTE — Assessment & Plan Note (Signed)
New-  >15 minutes spent in face to face time with patient, >50% spent in counselling or coordination of care. Start Metformin 500 mg daily with breakfast- discussed possible side effects. Also refer to diabetic teacher.  She is motivated to change diet and lose weight. Follow up in 3 months. The patient indicates understanding of these issues and agrees with the plan.

## 2014-11-07 NOTE — Progress Notes (Signed)
Pre visit review using our clinic review tool, if applicable. No additional management support is needed unless otherwise documented below in the visit note. 

## 2014-11-07 NOTE — Patient Instructions (Signed)
Great to see you. Cut back on carbohydrates and sugars. We are starting Metformin 500 mg daily with breakfast and will call you with an appointment to see a diabetic nutritionist.  Come see me in 3 months.

## 2014-11-24 ENCOUNTER — Ambulatory Visit: Payer: Self-pay | Admitting: Family Medicine

## 2014-11-28 ENCOUNTER — Ambulatory Visit
Admit: 2014-11-28 | Disposition: A | Discharge status: Home / Self Care | Payer: Self-pay | Attending: Family Medicine | Admitting: Family Medicine

## 2014-12-01 ENCOUNTER — Other Ambulatory Visit: Payer: Self-pay

## 2014-12-01 MED ORDER — METFORMIN HCL 500 MG PO TABS: 500.0000 mg | ORAL_TABLET | Freq: Every day | ORAL | Status: DC

## 2014-12-01 MED ORDER — ONETOUCH DELICA LANCETS 33G MISC: Status: AC

## 2014-12-01 MED ORDER — GLUCOSE BLOOD VI STRP: ORAL_STRIP | Status: DC

## 2014-12-01 NOTE — Telephone Encounter (Signed)
Pt saw nutritionist and was given onetouch mini ultra; pt request onetouch ultra blue test strip and onetouch delica lancets [facility]; also ins will only cover metformin if sent [facility]. Advised pt done. Pt will get refills updated at 3 month f/u appt.

## 2015-01-04 ENCOUNTER — Ambulatory Visit (INDEPENDENT_AMBULATORY_CARE_PROVIDER_SITE_OTHER): Payer: BLUE CROSS/BLUE SHIELD | Admitting: Family Medicine

## 2015-01-04 ENCOUNTER — Encounter: Payer: Self-pay | Admitting: Family Medicine

## 2015-01-04 VITALS — BP 108/64 | HR 113 | Temp 98.5°F | Ht 62.25 in | Wt 133.8 lb

## 2015-01-04 DIAGNOSIS — J441 Chronic obstructive pulmonary disease with (acute) exacerbation: Secondary | ICD-10-CM

## 2015-01-04 DIAGNOSIS — J3089 Other allergic rhinitis: Secondary | ICD-10-CM

## 2015-01-04 MED ORDER — AMOXICILLIN-POT CLAVULANATE 875-125 MG PO TABS: 1.0000 | ORAL_TABLET | Freq: Two times a day (BID) | ORAL | Status: DC

## 2015-01-04 NOTE — Assessment & Plan Note (Signed)
Given new productive cough, not improving after 7 days. Treat with antibiotics. No clear need for prednisone taper.

## 2015-01-04 NOTE — Progress Notes (Signed)
   Subjective:    Patient ID: Monique Sanchez, female    DOB: Feb 14, 1951, 64 y.o.   MRN: [id number]  Sinus Problem This is a new problem. The current episode started in the past 7 days. The problem has been gradually worsening since onset. There has been no fever. The pain is moderate (left sinus pain). Associated symptoms include congestion, coughing, a hoarse voice, sinus pressure and a sore throat. Pertinent negatives include no ear pain, headaches, shortness of breath or swollen glands. (Right ear popping) Past treatments include oral decongestants (Using OTC cold meds (not sure if decongestant in this), zyrtec, cough syrup). The treatment provided no relief.    64 year old female pt with history of HTN,  COPD,diabetes presents with new nasal congestion.  BP Readings from Last 3 Encounters:  01/04/15 108/64  11/07/14 116/66  11/01/14 116/62   Not keeping up at night.  Typically sinus infection, allergies this time year every year.  She has noted some productive cough.. different han usuals.  Z-pack in past, did not help.  Former smoker 25 pack year history, Quit 6 years ago.  Review of Systems  Constitutional: Negative for fever and fatigue.  HENT: Positive for congestion, hoarse voice, sinus pressure and sore throat. Negative for ear pain.   Eyes: Negative for pain.  Respiratory: Positive for cough. Negative for chest tightness and shortness of breath.   Cardiovascular: Negative for chest pain, palpitations and leg swelling.  Gastrointestinal: Negative for abdominal pain.  Genitourinary: Negative for dysuria.  Neurological: Negative for headaches.       Objective:   Physical Exam  Constitutional: Vital signs are normal. She appears well-developed and well-nourished. She is cooperative.  Non-toxic appearance. She does not appear ill. No distress.  HENT:  Head: Normocephalic.  Right Ear: Hearing and ear canal normal. No tenderness. Tympanic membrane is not erythematous,  not retracted and not bulging. A middle ear effusion is present.  Left Ear: Hearing, external ear and ear canal normal. No tenderness. Tympanic membrane is not erythematous, not retracted and not bulging. A middle ear effusion is present.  Nose: Mucosal edema and rhinorrhea present. Right sinus exhibits no maxillary sinus tenderness and no frontal sinus tenderness. Left sinus exhibits no maxillary sinus tenderness and no frontal sinus tenderness.  Mouth/Throat: Uvula is midline, oropharynx is clear and moist and mucous membranes are normal. No oropharyngeal exudate, posterior oropharyngeal edema or posterior oropharyngeal erythema.  Eyes: Conjunctivae, EOM and lids are normal. Pupils are equal, round, and reactive to light. Lids are everted and swept, no foreign bodies found.  Neck: Trachea normal and normal range of motion. Neck supple. Carotid bruit is not present. No thyroid mass and no thyromegaly present.  Cardiovascular: Normal rate, regular rhythm, S1 normal, S2 normal, normal heart sounds, intact distal pulses and normal pulses.  Exam reveals no gallop and no friction rub.   No murmur heard. Pulmonary/Chest: Effort normal and breath sounds normal. No tachypnea. No respiratory distress. She has no decreased breath sounds. She has no wheezes. She has no rhonchi. She has no rales.  Neurological: She is alert.  Skin: Skin is warm, dry and intact. No rash noted.  Psychiatric: Her speech is normal and behavior is normal. Judgment normal. Her mood appears not anxious. Cognition and memory are normal. She does not exhibit a depressed mood.          Assessment & Plan:

## 2015-01-04 NOTE — Progress Notes (Signed)
Pre visit review using our clinic review tool, if applicable. No additional management support is needed unless otherwise documented below in the visit note. 

## 2015-01-04 NOTE — Patient Instructions (Addendum)
Stop OTC cold meds as there is likely decongestant that is increasing heart rate. Continue zyrtec at bedtime.  Add OTC flonase/nasacort OTC 2 spray per nostril daily. Mucinex twice daily. Start and complete antibiotic course. Call if not improving as expected, go to ER for severe shortness of breath.

## 2015-01-04 NOTE — Assessment & Plan Note (Signed)
Continue zyrtec, add flonase.

## 2015-01-24 ENCOUNTER — Other Ambulatory Visit: Payer: Self-pay

## 2015-01-24 MED ORDER — BUDESONIDE-FORMOTEROL FUMARATE 160-4.5 MCG/ACT IN AERO: 2.0000 | INHALATION_SPRAY | Freq: Two times a day (BID) | RESPIRATORY_TRACT | Status: DC

## 2015-01-26 ENCOUNTER — Telehealth: Payer: Self-pay | Admitting: Pulmonary Disease

## 2015-01-26 MED ORDER — BUDESONIDE-FORMOTEROL FUMARATE 160-4.5 MCG/ACT IN AERO: 2.0000 | INHALATION_SPRAY | Freq: Two times a day (BID) | RESPIRATORY_TRACT | Status: DC

## 2015-01-26 NOTE — Telephone Encounter (Signed)
Called and spoke to pt. Pt requesting rx be sent to [facility]. Recent rx sent to local pharmacy. New rx sent to mail order pharmacy. Pt verbalized understanding and denied any further questions or concerns at this time.

## 2015-02-05 ENCOUNTER — Ambulatory Visit (INDEPENDENT_AMBULATORY_CARE_PROVIDER_SITE_OTHER): Payer: BLUE CROSS/BLUE SHIELD | Admitting: Family Medicine

## 2015-02-05 ENCOUNTER — Encounter: Payer: Self-pay | Admitting: Family Medicine

## 2015-02-05 VITALS — BP 118/64 | HR 88 | Temp 98.0°F | Wt 133.2 lb

## 2015-02-05 DIAGNOSIS — I10 Essential (primary) hypertension: Secondary | ICD-10-CM | POA: Diagnosis not present

## 2015-02-05 DIAGNOSIS — E785 Hyperlipidemia, unspecified: Secondary | ICD-10-CM

## 2015-02-05 DIAGNOSIS — E119 Type 2 diabetes mellitus without complications: Secondary | ICD-10-CM

## 2015-02-05 LAB — COMPREHENSIVE METABOLIC PANEL
ALBUMIN: 4.1 g/dL (ref 3.5–5.2)
ALT: 17 U/L (ref 0–35)
AST: 20 U/L (ref 0–37)
Alkaline Phosphatase: 80 U/L (ref 39–117)
BUN: 14 mg/dL (ref 6–23)
CALCIUM: 9.3 mg/dL (ref 8.4–10.5)
CO2: 34 meq/L — AB (ref 19–32)
Chloride: 96 mEq/L (ref 96–112)
Creatinine, Ser: 0.68 mg/dL (ref 0.40–1.20)
GFR: 92.23 mL/min (ref >60.00)
GLUCOSE: 103 mg/dL — AB (ref 70–99)
POTASSIUM: 3.9 meq/L (ref 3.5–5.1)
SODIUM: 134 meq/L — AB (ref 135–145)
TOTAL PROTEIN: 6.7 g/dL (ref 6.0–8.3)
Total Bilirubin: 0.9 mg/dL (ref 0.2–1.2)

## 2015-02-05 LAB — LIPID PANEL
Cholesterol: 211 mg/dL — ABNORMAL HIGH (ref 0–200)
HDL: 61.5 mg/dL (ref >39.00)
LDL Cholesterol: 125 mg/dL — ABNORMAL HIGH (ref 0–99)
NonHDL: 149.5
TRIGLYCERIDES: 121 mg/dL (ref 0.0–149.0)
Total CHOL/HDL Ratio: 3
VLDL: 24.2 mg/dL (ref 0.0–40.0)

## 2015-02-05 LAB — MICROALBUMIN / CREATININE URINE RATIO
Creatinine,U: 30 mg/dL
Microalb Creat Ratio: 2.3 mg/g (ref 0.0–30.0)
Microalb, Ur: <0.7 mg/dL (ref 0.0–1.9)

## 2015-02-05 LAB — HEMOGLOBIN A1C: Hgb A1c MFr Bld: 6.3 % (ref 4.6–6.5)

## 2015-02-05 NOTE — Assessment & Plan Note (Signed)
Improved control. Has lost 5 pounds- encouraged her to start walking again. Check labs and urine micro today. Declines pneumococcal vaccination.

## 2015-02-05 NOTE — Progress Notes (Signed)
Pre visit review using our clinic review tool, if applicable. No additional management support is needed unless otherwise documented below in the visit note. 

## 2015-02-05 NOTE — Progress Notes (Signed)
Subjective:   Patient ID: Monique Sanchez, female    DOB: December 28, 1950, 64 y.o.   MRN: [id number]  Monique Sanchez is a pleasant 64 y.o. year old female who presents to clinic today with Follow-up  on 02/05/2015  HPI:  DM- diagnosed in 10/2014.   At time, started her on Metformin 500 mg daily and referred to diabetic teaching. Has been to one class, felt it was educational.  Has not had any negative effects from Metformin.  Loose stools, but not diarrhea.  Checking FSBS twice daily. Brings log with her today. Ranging around 120s fasting. Denies any episodes of hypoglycemia.  On statin.  Lab Results  Component Value Date   HGBA1C 6.8* 11/01/2014   Lab Results  Component Value Date   CHOL 164 10/16/2014   HDL 58.90 10/16/2014   LDLCALC 77 10/16/2014   TRIG 140.0 10/16/2014   CHOLHDL 3 10/16/2014   Lab Results  Component Value Date   CREATININE 0.81 10/16/2014   Current Outpatient Prescriptions on File Prior to Visit  Medication Sig Dispense Refill  . acetaminophen (TYLENOL) 500 MG tablet Take 500 mg by mouth daily as needed.    . budesonide-formoterol (SYMBICORT) 160-4.5 MCG/ACT inhaler Inhale 2 puffs into the lungs 2 (two) times daily. 3 Inhaler 3  . carvedilol (COREG CR) 10 MG 24 hr capsule Take 1 capsule (10 mg total) by mouth daily. 90 capsule 1  . cetirizine (ZYRTEC) 10 MG tablet Take 10 mg by mouth at bedtime.    Marland Kitchen glucose blood test strip Check blood sugar once daily and as directed with onetouch ultra blue test strips. Dx E11.9 100 each 0  . hydrochlorothiazide (HYDRODIURIL) 25 MG tablet Take 1 tablet (25 mg total) by mouth daily. 90 tablet 1  . lansoprazole (PREVACID) 30 MG capsule Take 30 mg by mouth daily at 12 noon.    . metFORMIN (GLUCOPHAGE) 500 MG tablet Take 1 tablet (500 mg total) by mouth daily with breakfast. 90 tablet 0  . ONETOUCH DELICA LANCETS 41Y MISC Check blood sugar once daily and as directed. Dx E11.9 100 each 0  . OVER THE COUNTER MEDICATION  Phason- otc gas relief- prn    . rosuvastatin (CRESTOR) 5 MG tablet Take 1 tablet (5 mg total) by mouth daily. 90 tablet 1  . Spacer/Aero-Holding Chambers (AEROCHAMBER MV) inhaler Use as instructed 1 each 0  . tiotropium (SPIRIVA) 18 MCG inhalation capsule Place 1 capsule (18 mcg total) into inhaler and inhale daily. (Patient taking differently: Place 18 mcg into inhaler and inhale daily as needed. ) 90 capsule 1   No current facility-administered medications on file prior to visit.    Allergies  Allergen Reactions  . Influenza Vaccines     Muscle cramps, shaking    Past Medical History  Diagnosis Date  . Hypertension   . Hypercholesteremia   . Seasonal allergies   . Blockage of coronary artery of heart     50% blockage single vessels  . COPD (chronic obstructive pulmonary disease)   . Diverticular disease of colon 2010  . Arthritis   . Osteoarthritis   . Claustrophobia     Past Surgical History  Procedure Laterality Date  . Cholecystectomy  2011  . Ablation on endometriosis  1976  . Foot surgery  1995  . Cataract extraction w/ intraocular lens implant Left 05/2014  . Cataract extraction w/ intraocular lens implant Right 06/2014    right corneal meltdown. Is s/p special procedure where eye is  glued.  . Cardiac catheterization  2010    [facility]; Dr. Josefa Half  . Colon resection      for diverticulitis  . Colon surgery Left 2010  . Anterior cervical decomp/discectomy fusion  04/20/2014    Procedure: ANTERIOR CERVICAL DECOMPRESSION/DISCECTOMY FUSION 2 LEVELS;  Surgeon: Sinclair Ship, MD;  Location: [place];  Service: Orthopedics;;  Anterior cervical decompression fusion, cervical 4-5, cervical 5-6 with instrumentation and allograft, possible C4 corpectomy.    Family History  Problem Relation Age of Onset  . Heart disease Mother   . Cancer Mother     lung metastatic site  . Heart disease Father   . Cancer Brother     lung  . Hyperlipidemia Brother   .  Hypertension Brother     History   Social History  . Marital Status: Single    Spouse Name: N/A  . Number of Children: N/A  . Years of Education: N/A   Occupational History  . Not on file.   Social History Main Topics  . Smoking status: Former Smoker -- 0.50 packs/day for 30 years    Types: Cigarettes    Quit date: 09/30/2007  . Smokeless tobacco: Never Used  . Alcohol Use: 0.0 oz/week    0 Standard drinks or equivalent per week     Comment: occasional wine  . Drug Use: No  . Sexual Activity: Not on file   Other Topics Concern  . Not on file   Social History Narrative   The PMH, PSH, Social History, Family History, Medications, and allergies have been reviewed in [facility], and have been updated if relevant.   Review of Systems  Constitutional: Negative.   Gastrointestinal: Negative.   Endocrine: Negative.   Neurological: Negative.   Psychiatric/Behavioral: Negative.   All other systems reviewed and are negative.      Objective:    BP 118/64 mmHg  Pulse 88  Temp(Src) 98 F (36.7 C) (Oral)  Wt 133 lb 4 oz (60.442 kg)  SpO2 97%  Wt Readings from Last 3 Encounters:  02/05/15 133 lb 4 oz (60.442 kg)  01/04/15 133 lb 12 oz (60.669 kg)  11/07/14 138 lb 4 oz (62.71 kg)    Physical Exam  Constitutional: She is oriented to person, place, and time. She appears well-developed and well-nourished. No distress.  HENT:  Head: Normocephalic.  Eyes: Conjunctivae are normal.  Cardiovascular: Normal rate and regular rhythm.   Pulmonary/Chest: Effort normal and breath sounds normal. No respiratory distress. She has no wheezes. She has no rales.  Musculoskeletal: She exhibits no edema.  Neurological: She is alert and oriented to person, place, and time.  Skin: Skin is warm and dry.  Psychiatric: She has a normal mood and affect. Her behavior is normal. Judgment and thought content normal.  Nursing note and vitals reviewed.         Assessment & Plan:   New onset type  2 diabetes mellitus - Plan: Hemoglobin A1c, Microalbumin / creatinine urine ratio  Essential hypertension - Plan: Comprehensive metabolic panel  HLD (hyperlipidemia) - Plan: Lipid panel, Comprehensive metabolic panel No Follow-up on file.

## 2015-02-05 NOTE — Patient Instructions (Signed)
Great to see you. We will call you with your lab results from today. 

## 2015-02-06 ENCOUNTER — Encounter: Payer: Self-pay | Admitting: *Deleted

## 2015-02-09 ENCOUNTER — Other Ambulatory Visit: Payer: Self-pay | Admitting: *Deleted

## 2015-02-09 MED ORDER — HYDROCHLOROTHIAZIDE 25 MG PO TABS: 25.0000 mg | ORAL_TABLET | Freq: Every day | ORAL | Status: DC

## 2015-02-09 MED ORDER — CARVEDILOL PHOSPHATE ER 10 MG PO CP24: 10.0000 mg | ORAL_CAPSULE | Freq: Every day | ORAL | Status: DC

## 2015-02-15 ENCOUNTER — Other Ambulatory Visit: Payer: Self-pay

## 2015-02-15 MED ORDER — BUDESONIDE-FORMOTEROL FUMARATE 160-4.5 MCG/ACT IN AERO: 2.0000 | INHALATION_SPRAY | Freq: Two times a day (BID) | RESPIRATORY_TRACT | Status: DC

## 2015-04-18 ENCOUNTER — Other Ambulatory Visit: Payer: Self-pay | Admitting: Family Medicine

## 2015-04-20 ENCOUNTER — Other Ambulatory Visit: Payer: Self-pay | Admitting: *Deleted

## 2015-04-20 MED ORDER — ROSUVASTATIN CALCIUM 5 MG PO TABS: 5.0000 mg | ORAL_TABLET | Freq: Every day | ORAL | Status: DC

## 2015-05-02 ENCOUNTER — Encounter: Payer: Self-pay | Admitting: Family Medicine

## 2015-05-02 ENCOUNTER — Ambulatory Visit (INDEPENDENT_AMBULATORY_CARE_PROVIDER_SITE_OTHER): Payer: BLUE CROSS/BLUE SHIELD | Admitting: Family Medicine

## 2015-05-02 VITALS — BP 132/62 | HR 61 | Temp 98.2°F | Wt 131.5 lb

## 2015-05-02 DIAGNOSIS — I10 Essential (primary) hypertension: Secondary | ICD-10-CM

## 2015-05-02 DIAGNOSIS — E119 Type 2 diabetes mellitus without complications: Secondary | ICD-10-CM | POA: Diagnosis not present

## 2015-05-02 DIAGNOSIS — Z1239 Encounter for other screening for malignant neoplasm of breast: Secondary | ICD-10-CM

## 2015-05-02 DIAGNOSIS — E785 Hyperlipidemia, unspecified: Secondary | ICD-10-CM

## 2015-05-02 LAB — COMPREHENSIVE METABOLIC PANEL
ALBUMIN: 4.3 g/dL (ref 3.5–5.2)
ALT: 23 U/L (ref 0–35)
AST: 24 U/L (ref 0–37)
Alkaline Phosphatase: 74 U/L (ref 39–117)
BILIRUBIN TOTAL: 0.8 mg/dL (ref 0.2–1.2)
BUN: 12 mg/dL (ref 6–23)
CO2: 35 meq/L — AB (ref 19–32)
Calcium: 9.5 mg/dL (ref 8.4–10.5)
Chloride: 96 mEq/L (ref 96–112)
Creatinine, Ser: 0.74 mg/dL (ref 0.40–1.20)
GFR: 83.6 mL/min (ref >60.00)
GLUCOSE: 96 mg/dL (ref 70–99)
Potassium: 4.3 mEq/L (ref 3.5–5.1)
SODIUM: 137 meq/L (ref 135–145)
Total Protein: 6.8 g/dL (ref 6.0–8.3)

## 2015-05-02 LAB — LIPID PANEL
Cholesterol: 159 mg/dL (ref 0–200)
HDL: 60.4 mg/dL (ref >39.00)
LDL CALC: 76 mg/dL (ref 0–99)
NonHDL: 99.01
Total CHOL/HDL Ratio: 3
Triglycerides: 115 mg/dL (ref 0.0–149.0)
VLDL: 23 mg/dL (ref 0.0–40.0)

## 2015-05-02 LAB — HEMOGLOBIN A1C: HEMOGLOBIN A1C: 6.2 % (ref 4.6–6.5)

## 2015-05-02 NOTE — Progress Notes (Signed)
Pre visit review using our clinic review tool, if applicable. No additional management support is needed unless otherwise documented below in the visit note. 

## 2015-05-02 NOTE — Assessment & Plan Note (Signed)
Not quite at goal for a diabetic. Has been working on her lifestyle- diet and exercise.  Continue current dose of Crestor. Recheck labs today.

## 2015-05-02 NOTE — Assessment & Plan Note (Signed)
At goal for a diabetic. No changes made to rxs today. 

## 2015-05-02 NOTE — Assessment & Plan Note (Signed)
Well controlled, tolerating rxs ok. Due for a1c today. Refusing pneumococcal vaccines. Continue statin. Neg urine micro in May 2016.

## 2015-05-02 NOTE — Progress Notes (Signed)
Subjective:   Patient ID: Monique Sanchez, female    DOB: 04/18/1951, 64 y.o.   MRN: [id number]  Monique Sanchez is a pleasant 63 y.o. year old female who presents to clinic today with Follow-up  on 05/02/2015  HPI:  DM- diagnosed in 10/2014.   At time, started her on Metformin 500 mg daily and referred to diabetic teaching which she did attend.  Has not had any negative effects from Metformin.    Checking FSBS twice daily. Brings log with her today. Ranging around 110-119. Denies any episodes of hypoglycemia.  Has declined pneumococcal vaccination.   HLD- taking crestor 5 mg daily.  LDL not quite at goal for diabetic.  She has been working on her diet.  Neg urine micro 02/05/15.  Lab Results  Component Value Date   HGBA1C 6.3 02/05/2015   Lab Results  Component Value Date   CHOL 211* 02/05/2015   HDL 61.50 02/05/2015   LDLCALC 125* 02/05/2015   TRIG 121.0 02/05/2015   CHOLHDL 3 02/05/2015   Lab Results  Component Value Date   CREATININE 0.68 02/05/2015   Current Outpatient Prescriptions on File Prior to Visit  Medication Sig Dispense Refill  . acetaminophen (TYLENOL) 500 MG tablet Take 500 mg by mouth daily as needed.    . budesonide-formoterol (SYMBICORT) 160-4.5 MCG/ACT inhaler Inhale 2 puffs into the lungs 2 (two) times daily. 3 Inhaler 3  . carvedilol (COREG CR) 10 MG 24 hr capsule Take 1 capsule (10 mg total) by mouth daily. 90 capsule 1  . cetirizine (ZYRTEC) 10 MG tablet Take 10 mg by mouth at bedtime.    Marland Kitchen glucose blood test strip Check blood sugar once daily and as directed with onetouch ultra blue test strips. Dx E11.9 100 each 0  . hydrochlorothiazide (HYDRODIURIL) 25 MG tablet Take 1 tablet (25 mg total) by mouth daily. 90 tablet 1  . lansoprazole (PREVACID) 30 MG capsule Take 30 mg by mouth daily at 12 noon.    . metFORMIN (GLUCOPHAGE) 500 MG tablet TAKE 1 TABLET DAILY WITH BREAKFAST 90 tablet 1  . ONETOUCH DELICA LANCETS 42P MISC Check blood sugar  once daily and as directed. Dx E11.9 100 each 0  . OVER THE COUNTER MEDICATION Phason- otc gas relief- prn    . rosuvastatin (CRESTOR) 5 MG tablet Take 1 tablet (5 mg total) by mouth daily. 90 tablet 1  . Spacer/Aero-Holding Chambers (AEROCHAMBER MV) inhaler Use as instructed 1 each 0  . tiotropium (SPIRIVA) 18 MCG inhalation capsule Place 1 capsule (18 mcg total) into inhaler and inhale daily. (Patient taking differently: Place 18 mcg into inhaler and inhale daily as needed. ) 90 capsule 1   No current facility-administered medications on file prior to visit.    Allergies  Allergen Reactions  . Influenza Vaccines     Muscle cramps, shaking    Past Medical History  Diagnosis Date  . Hypertension   . Hypercholesteremia   . Seasonal allergies   . Blockage of coronary artery of heart     50% blockage single vessels  . COPD (chronic obstructive pulmonary disease)   . Diverticular disease of colon 2010  . Arthritis   . Osteoarthritis   . Claustrophobia     Past Surgical History  Procedure Laterality Date  . Cholecystectomy  2011  . Ablation on endometriosis  1976  . Foot surgery  1995  . Cataract extraction w/ intraocular lens implant Left 05/2014  . Cataract extraction w/ intraocular lens  implant Right 06/2014    right corneal meltdown. Is s/p special procedure where eye is glued.  . Cardiac catheterization  2010    [facility]; Dr. Josefa Half  . Colon resection      for diverticulitis  . Colon surgery Left 2010  . Anterior cervical decomp/discectomy fusion  04/20/2014    Procedure: ANTERIOR CERVICAL DECOMPRESSION/DISCECTOMY FUSION 2 LEVELS;  Surgeon: Sinclair Ship, MD;  Location: [place];  Service: Orthopedics;;  Anterior cervical decompression fusion, cervical 4-5, cervical 5-6 with instrumentation and allograft, possible C4 corpectomy.    Family History  Problem Relation Age of Onset  . Heart disease Mother   . Cancer Mother     lung metastatic site  . Heart  disease Father   . Cancer Brother     lung  . Hyperlipidemia Brother   . Hypertension Brother     History   Social History  . Marital Status: Single    Spouse Name: N/A  . Number of Children: N/A  . Years of Education: N/A   Occupational History  . Not on file.   Social History Main Topics  . Smoking status: Former Smoker -- 0.50 packs/day for 30 years    Types: Cigarettes    Quit date: 09/30/2007  . Smokeless tobacco: Never Used  . Alcohol Use: 0.0 oz/week    0 Standard drinks or equivalent per week     Comment: occasional wine  . Drug Use: No  . Sexual Activity: Not on file   Other Topics Concern  . Not on file   Social History Narrative   The PMH, PSH, Social History, Family History, Medications, and allergies have been reviewed in [facility], and have been updated if relevant.   Review of Systems  Constitutional: Negative.   Gastrointestinal: Negative.   Endocrine: Negative.   Neurological: Negative.   Psychiatric/Behavioral: Negative.   All other systems reviewed and are negative.      Objective:    BP 132/62 mmHg  Pulse 61  Temp(Src) 98.2 F (36.8 C) (Oral)  Wt 131 lb 8 oz (59.648 kg)  SpO2 94%  Wt Readings from Last 3 Encounters:  05/02/15 131 lb 8 oz (59.648 kg)  02/05/15 133 lb 4 oz (60.442 kg)  01/04/15 133 lb 12 oz (60.669 kg)    Physical Exam  Constitutional: She is oriented to person, place, and time. She appears well-developed and well-nourished. No distress.  HENT:  Head: Normocephalic.  Eyes: Conjunctivae are normal.  Cardiovascular: Normal rate and regular rhythm.   Pulmonary/Chest: Effort normal and breath sounds normal. No respiratory distress. She has no wheezes. She has no rales.  Musculoskeletal: She exhibits no edema.  Neurological: She is alert and oriented to person, place, and time.  Skin: Skin is warm and dry.  Psychiatric: She has a normal mood and affect. Her behavior is normal. Judgment and thought content normal.    Nursing note and vitals reviewed.         Assessment & Plan:   New onset type 2 diabetes mellitus - Plan: Hemoglobin A1c  Essential hypertension  HLD (hyperlipidemia) - Plan: Lipid panel, Comprehensive metabolic panel No Follow-up on file.

## 2015-05-02 NOTE — Patient Instructions (Signed)
Good to see you. We will call you with your lab work.  Keep up the great work.

## 2015-05-03 ENCOUNTER — Encounter: Payer: Self-pay | Admitting: *Deleted

## 2015-07-20 ENCOUNTER — Ambulatory Visit
Admission: RE | Admit: 2015-07-20 | Discharge: 2015-07-20 | Disposition: A | Discharge status: Home / Self Care | Payer: BLUE CROSS/BLUE SHIELD | Source: Ambulatory Visit | Attending: Family Medicine | Admitting: Family Medicine

## 2015-07-20 DIAGNOSIS — Z1239 Encounter for other screening for malignant neoplasm of breast: Secondary | ICD-10-CM

## 2015-07-20 DIAGNOSIS — Z1231 Encounter for screening mammogram for malignant neoplasm of breast: Secondary | ICD-10-CM | POA: Insufficient documentation

## 2015-08-02 ENCOUNTER — Ambulatory Visit (INDEPENDENT_AMBULATORY_CARE_PROVIDER_SITE_OTHER): Payer: BLUE CROSS/BLUE SHIELD | Admitting: Family Medicine

## 2015-08-02 ENCOUNTER — Encounter: Payer: Self-pay | Admitting: Family Medicine

## 2015-08-02 VITALS — BP 124/62 | HR 69 | Temp 98.1°F | Wt 132.8 lb

## 2015-08-02 DIAGNOSIS — E785 Hyperlipidemia, unspecified: Secondary | ICD-10-CM | POA: Diagnosis not present

## 2015-08-02 DIAGNOSIS — E119 Type 2 diabetes mellitus without complications: Secondary | ICD-10-CM | POA: Diagnosis not present

## 2015-08-02 DIAGNOSIS — I10 Essential (primary) hypertension: Secondary | ICD-10-CM | POA: Diagnosis not present

## 2015-08-02 LAB — COMPREHENSIVE METABOLIC PANEL
ALK PHOS: 65 U/L (ref 39–117)
ALT: 19 U/L (ref 0–35)
AST: 22 U/L (ref 0–37)
Albumin: 4.1 g/dL (ref 3.5–5.2)
BUN: 12 mg/dL (ref 6–23)
CO2: 33 mEq/L — ABNORMAL HIGH (ref 19–32)
Calcium: 9.4 mg/dL (ref 8.4–10.5)
Chloride: 97 mEq/L (ref 96–112)
Creatinine, Ser: 0.7 mg/dL (ref 0.40–1.20)
GFR: 89.06 mL/min (ref >60.00)
GLUCOSE: 103 mg/dL — AB (ref 70–99)
POTASSIUM: 4.6 meq/L (ref 3.5–5.1)
Sodium: 136 mEq/L (ref 135–145)
TOTAL PROTEIN: 6.7 g/dL (ref 6.0–8.3)
Total Bilirubin: 0.9 mg/dL (ref 0.2–1.2)

## 2015-08-02 LAB — HEMOGLOBIN A1C: HEMOGLOBIN A1C: 6.3 % (ref 4.6–6.5)

## 2015-08-02 MED ORDER — HYDROCHLOROTHIAZIDE 25 MG PO TABS: 25.0000 mg | ORAL_TABLET | Freq: Every day | ORAL | Status: DC

## 2015-08-02 NOTE — Progress Notes (Addendum)
Subjective:   Patient ID: Monique Sanchez, female    DOB: [birth year], 64 y.o.   MRN: [id number]  Monique Sanchez is a pleasant 64 y.o. year old female who presents to clinic today with Follow-up  on 08/02/2015  HPI:  DM- diagnosed in 10/2014.   Atthat  time, started her on Metformin 500 mg daily and referred to diabetic teaching which she did attend.  Has not had any negative effects from Metformin.    Checking FSBS twice daily. Brings log with her today. Ranging around 109- 120s. Denies any episodes of hypoglycemia. Neg urine micro 02/05/15  Has declined pneumococcal vaccination-updated in Epic.  Wt Readings from Last 3 Encounters:  08/02/15 132 lb 12 oz (60.215 kg)  05/02/15 131 lb 8 oz (59.648 kg)  02/05/15 133 lb 4 oz (60.442 kg)      HLD- taking crestor 5 mg daily.  LDL now is at goal for diabetic! She has been working on her diet.  Neg urine micro 02/05/15.  Lab Results  Component Value Date   HGBA1C 6.2 05/02/2015   Lab Results  Component Value Date   CHOL 159 05/02/2015   HDL 60.40 05/02/2015   LDLCALC 76 05/02/2015   TRIG 115.0 05/02/2015   CHOLHDL 3 05/02/2015   Lab Results  Component Value Date   CREATININE 0.74 05/02/2015   Current Outpatient Prescriptions on File Prior to Visit  Medication Sig Dispense Refill  . acetaminophen (TYLENOL) 500 MG tablet Take 500 mg by mouth daily as needed.    . budesonide-formoterol (SYMBICORT) 160-4.5 MCG/ACT inhaler Inhale 2 puffs into the lungs 2 (two) times daily. 3 Inhaler 3  . carvedilol (COREG CR) 10 MG 24 hr capsule Take 1 capsule (10 mg total) by mouth daily. 90 capsule 1  . cetirizine (ZYRTEC) 10 MG tablet Take 10 mg by mouth at bedtime.    Marland Kitchen glucose blood test strip Check blood sugar once daily and as directed with onetouch ultra blue test strips. Dx E11.9 100 each 0  . lansoprazole (PREVACID) 30 MG capsule Take 30 mg by mouth daily at 12 noon.    . metFORMIN (GLUCOPHAGE) 500 MG tablet TAKE 1 TABLET DAILY  WITH BREAKFAST 90 tablet 1  . ONETOUCH DELICA LANCETS 25E MISC Check blood sugar once daily and as directed. Dx E11.9 100 each 0  . OVER THE COUNTER MEDICATION Phason- otc gas relief- prn    . rosuvastatin (CRESTOR) 5 MG tablet Take 1 tablet (5 mg total) by mouth daily. 90 tablet 1  . Spacer/Aero-Holding Chambers (AEROCHAMBER MV) inhaler Use as instructed 1 each 0  . tiotropium (SPIRIVA) 18 MCG inhalation capsule Place 1 capsule (18 mcg total) into inhaler and inhale daily. (Patient taking differently: Place 18 mcg into inhaler and inhale daily as needed. ) 90 capsule 1   No current facility-administered medications on file prior to visit.    Allergies  Allergen Reactions  . Influenza Vaccines     Muscle cramps, shaking    Past Medical History  Diagnosis Date  . Hypertension   . Hypercholesteremia   . Seasonal allergies   . Blockage of coronary artery of heart (HCC)     50% blockage single vessels  . COPD (chronic obstructive pulmonary disease) ([place])   . Diverticular disease of colon 2010  . Arthritis   . Osteoarthritis   . Claustrophobia     Past Surgical History  Procedure Laterality Date  . Cholecystectomy  2011  . Ablation on endometriosis  [facility]  1995  . Cataract extraction w/ intraocular lens implant Left 05/2014  . Cataract extraction w/ intraocular lens implant Right 06/2014    right corneal meltdown. Is s/p special procedure where eye is glued.  . Cardiac catheterization  2010    [facility]; Dr. Josefa Half  . Colon resection      for diverticulitis  . Colon surgery Left 2010  . Anterior cervical decomp/discectomy fusion  04/20/2014    Procedure: ANTERIOR CERVICAL DECOMPRESSION/DISCECTOMY FUSION 2 LEVELS;  Surgeon: Sinclair Ship, MD;  Location: [facility];  Service: Orthopedics;;  Anterior cervical decompression fusion, cervical 4-5, cervical 5-6 with instrumentation and allograft, possible C4 corpectomy.    Family History  Problem Relation  Age of Onset  . Heart disease Mother   . Cancer Mother     lung metastatic site  . Heart disease Father   . Cancer Brother     lung  . Hyperlipidemia Brother   . Hypertension Brother   . Breast cancer Paternal Grandmother     Social History   Social History  . Marital Status: Single    Spouse Name: N/A  . Number of Children: N/A  . Years of Education: N/A   Occupational History  . Not on file.   Social History Main Topics  . Smoking status: Former Smoker -- 0.50 packs/day for 30 years    Types: Cigarettes    Quit date: 09/30/2007  . Smokeless tobacco: Never Used  . Alcohol Use: 0.0 oz/week    0 Standard drinks or equivalent per week     Comment: occasional wine  . Drug Use: No  . Sexual Activity: Not on file   Other Topics Concern  . Not on file   Social History Narrative   The PMH, PSH, Social History, Family History, Medications, and allergies have been reviewed in [facility], and have been updated if relevant.   Review of Systems  Constitutional: Negative.   HENT: Negative.   Respiratory: Negative.   Gastrointestinal: Negative.   Endocrine: Negative.   Genitourinary: Negative.   Neurological: Negative.   Psychiatric/Behavioral: Negative.   All other systems reviewed and are negative.      Objective:    BP 124/62 mmHg  Pulse 69  Temp(Src) 98.1 F (36.7 C) (Oral)  Wt 132 lb 12 oz (60.215 kg)  SpO2 96%  Wt Readings from Last 3 Encounters:  08/02/15 132 lb 12 oz (60.215 kg)  05/02/15 131 lb 8 oz (59.648 kg)  02/05/15 133 lb 4 oz (60.442 kg)    Physical Exam  Constitutional: She is oriented to person, place, and time. She appears well-developed and well-nourished. No distress.  HENT:  Head: Normocephalic.  Eyes: Conjunctivae are normal.  Cardiovascular: Normal rate and regular rhythm.   Pulmonary/Chest: Effort normal and breath sounds normal. No respiratory distress. She has no wheezes. She has no rales.  Musculoskeletal: She exhibits no edema.    Neurological: She is alert and oriented to person, place, and time.  Skin: Skin is warm and dry.  Psychiatric: She has a normal mood and affect. Her behavior is normal. Judgment and thought content normal.  Nursing note and vitals reviewed.         Assessment & Plan:   New onset type 2 diabetes mellitus ([place]) - Plan: Hemoglobin A1c, Comprehensive metabolic panel  HLD (hyperlipidemia)  Essential hypertension No Follow-up on file.

## 2015-08-02 NOTE — Assessment & Plan Note (Signed)
We discussed a plan today. DM has been under good control and she has made life style changes.  Will recheck a1c today, plan on continuing metformin 500 mg daily and if she decides to stop taking metformin after the holidays, she will call me.  3 months after stopping, will recheck her a1c but she will continue to check FSBS twice daily in the interm. The patient indicates understanding of these issues and agrees with the plan.

## 2015-08-02 NOTE — Patient Instructions (Signed)
Great to see you. Let's continue your Metformin for now.  Call me if or when you decide to stop taking it.

## 2015-08-02 NOTE — Progress Notes (Signed)
Pre visit review using our clinic review tool, if applicable. No additional management support is needed unless otherwise documented below in the visit note. 

## 2015-09-10 ENCOUNTER — Other Ambulatory Visit: Payer: Self-pay | Admitting: Family Medicine

## 2015-09-10 NOTE — Telephone Encounter (Signed)
Last f/u 07/2015

## 2015-09-12 NOTE — Telephone Encounter (Signed)
Pt wants to know if Dr Deborra Medina will let pt get generic form of coreg; advised pt refill already done but was not listed as DAW. Pt will wait to see what is given.

## 2015-09-25 ENCOUNTER — Encounter: Payer: Self-pay | Admitting: Primary Care

## 2015-09-25 ENCOUNTER — Ambulatory Visit (INDEPENDENT_AMBULATORY_CARE_PROVIDER_SITE_OTHER): Payer: BLUE CROSS/BLUE SHIELD | Admitting: Primary Care

## 2015-09-25 VITALS — BP 128/72 | HR 80 | Temp 97.9°F | Wt 134.8 lb

## 2015-09-25 DIAGNOSIS — R05 Cough: Secondary | ICD-10-CM | POA: Diagnosis not present

## 2015-09-25 DIAGNOSIS — R059 Cough, unspecified: Secondary | ICD-10-CM

## 2015-09-25 MED ORDER — AZITHROMYCIN 250 MG PO TABS: ORAL_TABLET | ORAL | Status: DC

## 2015-09-25 NOTE — Progress Notes (Signed)
Subjective:    Patient ID: Monique Sanchez, female    DOB: 07/13/1951, 64 y.o.   MRN: [id number]  HPI  Monique Sanchez is a 64 year old female who presents today with a chief complaint of nasal congestion. She also reports sore throat, sinus pressure, and cough. Her symptoms have been present for the past 2 weeks. She woke up Christmas morning and noticed "white spots" to her posterior pharynx. Her symptoms became worse over Christmas Eve as her cough has progressed. Her cough is occasionally productive with yellow mucous. Denies fevers, chills, ear pain. She's taken Dayquil and Nyquil OTC with temporary improvement. Denies sick contacts.  Review of Systems  Constitutional: Positive for fatigue. Negative for fever and chills.  HENT: Positive for congestion, sinus pressure and sore throat. Negative for ear pain.   Respiratory: Positive for cough. Negative for shortness of breath.   Cardiovascular: Negative for chest pain.  Gastrointestinal: Negative for nausea.  Musculoskeletal: Negative for myalgias.       Past Medical History  Diagnosis Date  . Hypertension   . Hypercholesteremia   . Seasonal allergies   . Blockage of coronary artery of heart (HCC)     50% blockage single vessels  . COPD (chronic obstructive pulmonary disease) ([place])   . Diverticular disease of colon 2010  . Arthritis   . Osteoarthritis   . Claustrophobia     Social History   Social History  . Marital Status: Single    Spouse Name: N/A  . Number of Children: N/A  . Years of Education: N/A   Occupational History  . Not on file.   Social History Main Topics  . Smoking status: Former Smoker -- 0.50 packs/day for 30 years    Types: Cigarettes    Quit date: 09/30/2007  . Smokeless tobacco: Never Used  . Alcohol Use: 0.0 oz/week    0 Standard drinks or equivalent per week     Comment: occasional wine  . Drug Use: No  . Sexual Activity: Not on file   Other Topics Concern  . Not on file   Social  History Narrative    Past Surgical History  Procedure Laterality Date  . Cholecystectomy  2011  . Ablation on endometriosis  1976  . [place] surgery  1995  . Cataract extraction w/ intraocular lens implant Left 05/2014  . Cataract extraction w/ intraocular lens implant Right 06/2014    right corneal meltdown. Is s/p special procedure where eye is glued.  . Cardiac catheterization  2010    [facility]; Dr. Josefa Half  . Colon resection      for diverticulitis  . Colon surgery Left 2010  . Anterior cervical decomp/discectomy fusion  04/20/2014    Procedure: ANTERIOR CERVICAL DECOMPRESSION/DISCECTOMY FUSION 2 LEVELS;  Surgeon: Sinclair Ship, MD;  Location: [facility];  Service: Orthopedics;;  Anterior cervical decompression fusion, cervical 4-5, cervical 5-6 with instrumentation and allograft, possible C4 corpectomy.    Family History  Problem Relation Age of Onset  . Heart disease Mother   . Cancer Mother     lung metastatic site  . Heart disease Father   . Cancer Brother     lung  . Hyperlipidemia Brother   . Hypertension Brother   . Breast cancer Paternal Grandmother     Allergies  Allergen Reactions  . Influenza Vaccines     Muscle cramps, shaking    Current Outpatient Prescriptions on File Prior to Visit  Medication Sig Dispense Refill  .  acetaminophen (TYLENOL) 500 MG tablet Take 500 mg by mouth daily as needed.    . budesonide-formoterol (SYMBICORT) 160-4.5 MCG/ACT inhaler Inhale 2 puffs into the lungs 2 (two) times daily. 3 Inhaler 3  . cetirizine (ZYRTEC) 10 MG tablet Take 10 mg by mouth at bedtime.    Marland Kitchen COREG CR 10 MG 24 hr capsule TAKE 1 CAPSULE DAILY 90 capsule 0  . glucose blood test strip Check blood sugar once daily and as directed with onetouch ultra blue test strips. Dx E11.9 100 each 0  . hydrochlorothiazide (HYDRODIURIL) 25 MG tablet Take 1 tablet (25 mg total) by mouth daily. 90 tablet 3  . lansoprazole (PREVACID) 30 MG capsule Take 30 mg by mouth  daily at 12 noon.    . metFORMIN (GLUCOPHAGE) 500 MG tablet TAKE 1 TABLET DAILY WITH BREAKFAST 90 tablet 1  . ONETOUCH DELICA LANCETS 99991111 MISC Check blood sugar once daily and as directed. Dx E11.9 100 each 0  . OVER THE COUNTER MEDICATION Phason- otc gas relief- prn    . rosuvastatin (CRESTOR) 5 MG tablet Take 1 tablet (5 mg total) by mouth daily. 90 tablet 1  . Spacer/Aero-Holding Chambers (AEROCHAMBER MV) inhaler Use as instructed 1 each 0  . tiotropium (SPIRIVA) 18 MCG inhalation capsule Place 1 capsule (18 mcg total) into inhaler and inhale daily. (Patient taking differently: Place 18 mcg into inhaler and inhale daily as needed. ) 90 capsule 1   No current facility-administered medications on file prior to visit.    BP 128/72 mmHg  Pulse 80  Temp(Src) 97.9 F (36.6 C) (Oral)  Wt 134 lb 12 oz (61.122 kg)  SpO2 98%    Objective:   Physical Exam  Constitutional: She appears well-nourished.  HENT:  Right Ear: Tympanic membrane and ear canal normal.  Left Ear: Tympanic membrane and ear canal normal.  Nose: Right sinus exhibits no maxillary sinus tenderness and no frontal sinus tenderness. Left sinus exhibits no maxillary sinus tenderness and no frontal sinus tenderness.  Mouth/Throat: Posterior oropharyngeal erythema present. No oropharyngeal exudate or posterior oropharyngeal edema.  Cardiovascular: Normal rate and regular rhythm.   Pulmonary/Chest: Effort normal and breath sounds normal. She has no rales.  Skin: Skin is warm and dry.          Assessment & Plan:  URI:  Cough x 2 weeks, sore throat, headaches. Symptoms started to progress on 09/22/15 last week. Exam mostly unremarkable, lugs clear. Some erythema to posterior pharynx, no exudate. Due to duration and progression of symptoms will treat empirically. RX for Zpack sent to pharmacy. Flonase, fluids, rest. Return precautions provided.

## 2015-09-25 NOTE — Patient Instructions (Signed)
Start Azithromycin antibiotics. Take 2 tablets by mouth today, then 1 tablet daily for 4 additional days.  Nasal Congestion: Flonase (fluticasone) nasal spray. Instill 2 sprays in each nostril once daily.  Increase consumption of fluids and rest.  It was a pleasure meeting you!

## 2015-09-25 NOTE — Progress Notes (Signed)
Pre visit review using our clinic review tool, if applicable. No additional management support is needed unless otherwise documented below in the visit note. 

## 2015-10-09 ENCOUNTER — Telehealth: Payer: Self-pay

## 2015-10-09 DIAGNOSIS — R05 Cough: Secondary | ICD-10-CM

## 2015-10-09 DIAGNOSIS — J3489 Other specified disorders of nose and nasal sinuses: Secondary | ICD-10-CM

## 2015-10-09 DIAGNOSIS — R059 Cough, unspecified: Secondary | ICD-10-CM

## 2015-10-09 MED ORDER — AMOXICILLIN-POT CLAVULANATE 875-125 MG PO TABS: 1.0000 | ORAL_TABLET | Freq: Two times a day (BID) | ORAL | Status: DC

## 2015-10-09 NOTE — Telephone Encounter (Signed)
I remember wanting to put her on Augmentin initially, but she only wanted a Zpak as they have historically worked.  Suspected more sinusitis and will treat with Augmentin. If no improvement with this antibiotic then she will need to schedule another office visit. RX sent to pharmacy. She will take 1 tablet by mouth twice daily for 7 days.

## 2015-10-09 NOTE — Telephone Encounter (Signed)
Pt left v/m; pt seen 09/25/15 with sinus issues and chest congestion and pt  finished abx and felt better for couple of days; now feeling worse again and request stronger abx. Pt request abx given the time before zpak; on hx med list augmentin was given on 01/04/15. Pt request cb.[facility].

## 2015-10-09 NOTE — Telephone Encounter (Signed)
Per DPR, left detail message for patient regarding Kate's comments.

## 2015-10-31 ENCOUNTER — Other Ambulatory Visit: Payer: Self-pay | Admitting: Family Medicine

## 2015-11-26 ENCOUNTER — Telehealth: Payer: Self-pay | Admitting: *Deleted

## 2015-11-26 NOTE — Telephone Encounter (Signed)
Patient left a voicemail stating that she wanted to let you know that she stopped her Metformin the middle of February. Patient wants to know when she should come back and have lab work done?

## 2015-11-26 NOTE — Telephone Encounter (Signed)
3 months from the day she stopped it- so mid May.

## 2015-11-27 NOTE — Telephone Encounter (Signed)
Spoke to pt and advised per Dr Deborra Medina. Pt states she will contact office back to schedule OV

## 2015-12-27 ENCOUNTER — Telehealth: Payer: Self-pay | Admitting: Family Medicine

## 2015-12-27 ENCOUNTER — Telehealth: Payer: Self-pay | Admitting: *Deleted

## 2015-12-27 MED ORDER — METOPROLOL SUCCINATE ER 25 MG PO TB24: 25.0000 mg | ORAL_TABLET | Freq: Every day | ORAL | Status: DC

## 2015-12-27 NOTE — Telephone Encounter (Signed)
You will have to send it in as its a new Rx and will require dosing

## 2015-12-27 NOTE — Telephone Encounter (Signed)
Lm on pts vm and advised to contact office with readings, if able to check at home. If not, pt was advised to contact office back and schedule 2wk f/u for starting new med

## 2015-12-27 NOTE — Telephone Encounter (Signed)
Pt returned waynettes call She has been taking coreg for some time   She stated she just need a refill on coreg  The insurance suggested the generic for coreg it would be cheaper. [facility]   Pt has 10 pills left Please let pt know when this has been called in  Best number [phone]

## 2015-12-27 NOTE — Telephone Encounter (Signed)
Spoke to pt who states that she contacted office back before listening to the message i left. Pt states she will check BP and contact office back with readings. May be a 3 wks before she does so, as the medication will take appx 10days to be delivered

## 2015-12-27 NOTE — Telephone Encounter (Signed)
Pt dropped off medication information from [facility].  She would like refill of Coreg changed to generic Metoprolol if PCP authorizes.  Patient is requesting a call back at [phone] when complete.  ppw placed on triage nurse desk

## 2015-12-27 NOTE — Telephone Encounter (Signed)
Noted. eRx sent.  Can she check her blood pressure at home?  If not, I do want her to follow up with me in 2 weeks.

## 2015-12-27 NOTE — Telephone Encounter (Signed)
Ok to refill as requested 

## 2015-12-27 NOTE — Telephone Encounter (Signed)
Opened in error. See previous phone note regarding medication

## 2016-01-03 NOTE — Telephone Encounter (Signed)
Pt left v/m; pt is taking Coreg 10 mg and the rx pt just received is Metoprolol 25 mg. Pt still has 5 pills of the coreg 10 mg to take; pt wants to verify that the metoprolol should be 25 mg since pt presently taking coreg 10 mg. Pt has not checked BP yet. Pt request cb.

## 2016-01-04 NOTE — Telephone Encounter (Signed)
It is correct.  Metoprolol comes in different dosing- no 10 mg dose available and its not a one to one conversion in terms of strength.

## 2016-01-04 NOTE — Telephone Encounter (Signed)
Spoke to pt and advised per Dr Deborra Medina. Pt verbally expressed understanding and states she was wanting to confirm she was not given incorrect meds

## 2016-01-17 ENCOUNTER — Other Ambulatory Visit: Payer: Self-pay

## 2016-01-17 ENCOUNTER — Ambulatory Visit (INDEPENDENT_AMBULATORY_CARE_PROVIDER_SITE_OTHER): Payer: BLUE CROSS/BLUE SHIELD | Admitting: Pulmonary Disease

## 2016-01-17 ENCOUNTER — Encounter: Payer: Self-pay | Admitting: Pulmonary Disease

## 2016-01-17 VITALS — BP 140/70 | HR 64 | Ht 62.5 in | Wt 135.2 lb

## 2016-01-17 DIAGNOSIS — J449 Chronic obstructive pulmonary disease, unspecified: Secondary | ICD-10-CM | POA: Diagnosis not present

## 2016-01-17 MED ORDER — TIOTROPIUM BROMIDE MONOHYDRATE 18 MCG IN CAPS: 18.0000 ug | ORAL_CAPSULE | Freq: Every day | RESPIRATORY_TRACT | Status: DC

## 2016-01-17 NOTE — Progress Notes (Signed)
Subjective:    Patient ID: Monique Sanchez, female    DOB: [birth year], 65 y.o.   MRN: [id number]  Synopsis: First saw [facility] for GOLD Grade C COPD in 2015 01/2014 Simple spirometry> Ratio 36%, FEV1  0.54L  (24% pred) Allergic to flu shot. Due to this she is reluctant to take the pneumonia vaccine.   HPI  Chief Complaint  Patient presents with  . Follow-up    1 year rov copd.  Pt states she is doing well, no complaints today.     Monique Sanchez has been doing OK. The allergies have been flaring up lately with sinus symptoms, congestion, cough.  No flare ups since the last visit.  She had one sinus infection since the last visit.  She still works and tries to exercise with walking some.  She has treadmill now and has been using that some.  She takes the Symbicort regularly, but only the Spiriva as needed on days when she feels a little worse.  It helps those days. No albuterol.   Past Medical History  Diagnosis Date  . Hypertension   . Hypercholesteremia   . Seasonal allergies   . Blockage of coronary artery of heart (HCC)     50% blockage single vessels  . COPD (chronic obstructive pulmonary disease) ([place])   . Diverticular disease of colon 2010  . Arthritis   . Osteoarthritis   . Claustrophobia      Review of Systems  Constitutional: Negative for fever, chills and fatigue.  HENT: Positive for postnasal drip. Negative for nosebleeds and rhinorrhea.   Respiratory: Negative for cough, shortness of breath and wheezing.   Cardiovascular: Negative for chest pain, palpitations and leg swelling.       Objective:   Physical Exam  Filed Vitals:   01/17/16 1051  BP: 140/70  Pulse: 64  Height: 5' 2.5" (1.588 m)  Weight: 135 lb 3.2 oz (61.326 kg)  SpO2: 97%  RA  Gen: well appearing, no acute distress HEENT: NCAT, EOMi, OP clear, PULM: Limited air movement but no wheezing CV: RRR, no mgr, no JVD AB: BS+, soft, nontender,  Ext: warm, no edema, no clubbing, no cyanosis     Assessment & Plan:   COPD Grade C This has been a stable interval for Monique Sanchez. She has not had any exacerbations and she remains largely asymptomatic. 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  Plan: Continue Symbicort and Spiriva Follow-up one year Remain active    Updated Medication List Outpatient Encounter Prescriptions as of 01/17/2016  Medication Sig  . acetaminophen (TYLENOL) 500 MG tablet Take 500 mg by mouth daily as needed.  . budesonide-formoterol (SYMBICORT) 160-4.5 MCG/ACT inhaler Inhale 2 puffs into the lungs 2 (two) times daily.  . cetirizine (ZYRTEC) 10 MG tablet Take 10 mg by mouth at bedtime.  Marland Kitchen glucose blood test strip Check blood sugar once daily and as directed with onetouch ultra blue test strips. Dx E11.9  . hydrochlorothiazide (HYDRODIURIL) 25 MG tablet Take 1 tablet (25 mg total) by mouth daily.  . lansoprazole (PREVACID) 30 MG capsule Take 30 mg by mouth daily at 12 noon.  . metoprolol succinate (TOPROL-XL) 25 MG 24 hr tablet Take 1 tablet (25 mg total) by mouth daily.  Glory Rosebush DELICA LANCETS 99991111 MISC Check blood sugar once daily and as directed. Dx E11.9  . OVER THE COUNTER MEDICATION Phason- otc gas relief- prn  . rosuvastatin (  CRESTOR) 5 MG tablet TAKE 1 TABLET DAILY  . Spacer/Aero-Holding Chambers (AEROCHAMBER MV) inhaler Use as instructed  . tiotropium (SPIRIVA) 18 MCG inhalation capsule Place 1 capsule (18 mcg total) into inhaler and inhale daily.  . [DISCONTINUED] tiotropium (SPIRIVA) 18 MCG inhalation capsule Place 1 capsule (18 mcg total) into inhaler and inhale daily. (Patient taking differently: Place 18 mcg into inhaler and inhale daily as needed. )  . [DISCONTINUED] amoxicillin-clavulanate (AUGMENTIN) 875-125 MG tablet Take 1 tablet by mouth 2 (two) times daily. (Patient not taking: Reported on 01/17/2016)  . [DISCONTINUED] metFORMIN (GLUCOPHAGE) 500 MG tablet TAKE 1 TABLET  DAILY WITH BREAKFAST (Patient not taking: Reported on 01/17/2016)   No facility-administered encounter medications on file as of 01/17/2016.

## 2016-01-17 NOTE — Assessment & Plan Note (Signed)
This has been a stable interval for [name]. She has not had any exacerbations and she remains largely asymptomatic. 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  Plan: Continue Symbicort and Spiriva Follow-up one year Remain active

## 2016-01-17 NOTE — Patient Instructions (Signed)
Keep taking Symbicort and Spiriva as you're doing We will see you back in one year or sooner if needed

## 2016-02-18 ENCOUNTER — Telehealth: Payer: Self-pay | Admitting: Pulmonary Disease

## 2016-02-18 ENCOUNTER — Other Ambulatory Visit: Payer: Self-pay | Admitting: Pulmonary Disease

## 2016-02-18 MED ORDER — BUDESONIDE-FORMOTEROL FUMARATE 160-4.5 MCG/ACT IN AERO: 2.0000 | INHALATION_SPRAY | Freq: Two times a day (BID) | RESPIRATORY_TRACT | Status: DC

## 2016-02-18 NOTE — Telephone Encounter (Signed)
Spoke with pt. She needs a refill on Symbicort. This has been sent to [facility]. Nothing further was needed.

## 2016-03-19 ENCOUNTER — Encounter: Payer: Self-pay | Admitting: Family Medicine

## 2016-03-19 ENCOUNTER — Ambulatory Visit (INDEPENDENT_AMBULATORY_CARE_PROVIDER_SITE_OTHER): Payer: BLUE CROSS/BLUE SHIELD | Admitting: Family Medicine

## 2016-03-19 VITALS — BP 118/64 | HR 66 | Temp 98.1°F | Wt 134.8 lb

## 2016-03-19 DIAGNOSIS — I10 Essential (primary) hypertension: Secondary | ICD-10-CM

## 2016-03-19 DIAGNOSIS — E785 Hyperlipidemia, unspecified: Secondary | ICD-10-CM

## 2016-03-19 DIAGNOSIS — E119 Type 2 diabetes mellitus without complications: Secondary | ICD-10-CM

## 2016-03-19 LAB — MICROALBUMIN / CREATININE URINE RATIO
CREATININE, U: 34.5 mg/dL
Microalb Creat Ratio: 2 mg/g (ref 0.0–30.0)

## 2016-03-19 LAB — COMPREHENSIVE METABOLIC PANEL
ALT: 21 U/L (ref 0–35)
AST: 25 U/L (ref 0–37)
Albumin: 4.3 g/dL (ref 3.5–5.2)
Alkaline Phosphatase: 77 U/L (ref 39–117)
BUN: 12 mg/dL (ref 6–23)
CALCIUM: 9.4 mg/dL (ref 8.4–10.5)
CHLORIDE: 99 meq/L (ref 96–112)
CO2: 32 meq/L (ref 19–32)
Creatinine, Ser: 0.67 mg/dL (ref 0.40–1.20)
GFR: 93.5 mL/min (ref >60.00)
Glucose, Bld: 110 mg/dL — ABNORMAL HIGH (ref 70–99)
Potassium: 4.1 mEq/L (ref 3.5–5.1)
Sodium: 135 mEq/L (ref 135–145)
Total Bilirubin: 0.8 mg/dL (ref 0.2–1.2)
Total Protein: 6.9 g/dL (ref 6.0–8.3)

## 2016-03-19 LAB — LIPID PANEL
CHOL/HDL RATIO: 3
Cholesterol: 170 mg/dL (ref 0–200)
HDL: 64.9 mg/dL (ref >39.00)
LDL CALC: 87 mg/dL (ref 0–99)
NonHDL: 104.8
TRIGLYCERIDES: 91 mg/dL (ref 0.0–149.0)
VLDL: 18.2 mg/dL (ref 0.0–40.0)

## 2016-03-19 LAB — HEMOGLOBIN A1C: Hgb A1c MFr Bld: 6.3 % (ref 4.6–6.5)

## 2016-03-19 NOTE — Assessment & Plan Note (Signed)
Has been well controlled. Repeat labs today.

## 2016-03-19 NOTE — Assessment & Plan Note (Signed)
Appears to be well controlled without rx but will check labs today to confirm this. The patient indicates understanding of these issues and agrees with the plan.

## 2016-03-19 NOTE — Progress Notes (Signed)
Pre visit review using our clinic review tool, if applicable. No additional management support is needed unless otherwise documented below in the visit note. 

## 2016-03-19 NOTE — Progress Notes (Signed)
Subjective:   Patient ID: Monique Sanchez, female    DOB: May 21, 1951, 65 y.o.   MRN: [id number]  Monique Sanchez is a pleasant 65 y.o. year old female who presents to clinic today with Follow-up  on 03/19/2016  HPI:  DM- diagnosed in 10/2014.   At that  time, started her on Metformin 500 mg daily and referred to diabetic teaching which she did attend.  Stopped metformin 3 months.  Checking FSBS twice daily. Brings log with her today. Ranging around 120s. Denies any episodes of hypoglycemia. Neg urine micro 02/05/15  Has declined pneumococcal vaccination.  Wt Readings from Last 3 Encounters:  03/19/16 134 lb 12 oz (61.122 kg)  01/17/16 135 lb 3.2 oz (61.326 kg)  09/25/15 134 lb 12 oz (61.122 kg)   Lab Results  Component Value Date   HGBA1C 6.3 08/02/2015    HLD- taking crestor 5 mg daily.  LDL has been at goal for a diabetic.  HTN- has been stable on Toprol and HCTZ.   Lab Results  Component Value Date   CHOL 159 05/02/2015   HDL 60.40 05/02/2015   LDLCALC 76 05/02/2015   TRIG 115.0 05/02/2015   CHOLHDL 3 05/02/2015   Lab Results  Component Value Date   CREATININE 0.70 08/02/2015   Current Outpatient Prescriptions on File Prior to Visit  Medication Sig Dispense Refill  . acetaminophen (TYLENOL) 500 MG tablet Take 500 mg by mouth daily as needed.    . budesonide-formoterol (SYMBICORT) 160-4.5 MCG/ACT inhaler Inhale 2 puffs into the lungs 2 (two) times daily. 3 Inhaler 3  . cetirizine (ZYRTEC) 10 MG tablet Take 10 mg by mouth at bedtime.    Marland Kitchen glucose blood test strip Check blood sugar once daily and as directed with onetouch ultra blue test strips. Dx E11.9 100 each 0  . hydrochlorothiazide (HYDRODIURIL) 25 MG tablet Take 1 tablet (25 mg total) by mouth daily. 90 tablet 3  . lansoprazole (PREVACID) 30 MG capsule Take 30 mg by mouth daily at 12 noon.    . metoprolol succinate (TOPROL-XL) 25 MG 24 hr tablet Take 1 tablet (25 mg total) by mouth daily. 90 tablet 3    . ONETOUCH DELICA LANCETS 99991111 MISC Check blood sugar once daily and as directed. Dx E11.9 100 each 0  . OVER THE COUNTER MEDICATION Phason- otc gas relief- prn    . rosuvastatin (CRESTOR) 5 MG tablet TAKE 1 TABLET DAILY 90 tablet 1  . Spacer/Aero-Holding Chambers (AEROCHAMBER MV) inhaler Use as instructed 1 each 0  . tiotropium (SPIRIVA) 18 MCG inhalation capsule Place 1 capsule (18 mcg total) into inhaler and inhale daily. 90 capsule 3   No current facility-administered medications on file prior to visit.    Allergies  Allergen Reactions  . Influenza Vaccines     Muscle cramps, shaking    Past Medical History  Diagnosis Date  . Hypertension   . Hypercholesteremia   . Seasonal allergies   . Blockage of coronary artery of heart (HCC)     50% blockage single vessels  . COPD (chronic obstructive pulmonary disease) ([place])   . Diverticular disease of colon 2010  . Arthritis   . Osteoarthritis   . Claustrophobia     Past Surgical History  Procedure Laterality Date  . Cholecystectomy  2011  . Ablation on endometriosis  1976  . Foot surgery  1995  . Cataract extraction w/ intraocular lens implant Left 05/2014  . Cataract extraction w/ intraocular lens implant Right  06/2014    right corneal meltdown. Is s/p special procedure where eye is glued.  . [facility]iac [facility]ization  2010     [facility]; Dr. Josefa [facility]  . Colon resection      for diverticulitis  . Colon surgery Left 2010  . Anterior cervical decomp/discectomy fusion  04/20/2014    Procedure: ANTERIOR CERVICAL DECOMPRESSION/DISCECTOMY FUSION 2 LEVELS;  Surgeon: Sinclair Ship, MD;  Location: [facility];  Service: Orthopedics;;  Anterior cervical decompression fusion, cervical 4-5, cervical 5-6 with instrumentation and allograft, possible C4 corpectomy.    Family History  Problem Relation Age of Onset  . Heart disease Mother   . Cancer Mother     lung metastatic site  . Heart disease Father   . Cancer Brother      lung  . Hyperlipidemia Brother   . Hypertension Brother   . Breast cancer Paternal Grandmother     Social History   Social History  . Marital Status: Single    Spouse Name: N/A  . Number of Children: N/A  . Years of Education: N/A   Occupational History  . Not on file.   Social History Main Topics  . Smoking status: Former Smoker -- 0.50 packs/day for 30 years    Types: Cigarettes    Quit date: 09/30/2007  . Smokeless tobacco: Never Used  . Alcohol Use: 0.0 oz/week    0 Standard drinks or equivalent per week     Comment: occasional wine  . Drug Use: No  . Sexual Activity: Not on file   Other Topics Concern  . Not on file   Social History Narrative   The PMH, PSH, Social History, Family History, Medications, and allergies have been reviewed in [facility], and have been updated if relevant.   Review of Systems  Constitutional: Negative.   HENT: Negative.   Eyes: Negative.   Respiratory: Negative.   Cardiovascular: Negative.   Gastrointestinal: Negative.   Endocrine: Negative.   Genitourinary: Negative.   Musculoskeletal: Negative.   Skin: Negative.   Neurological: Negative.   Psychiatric/Behavioral: Negative.   All other systems reviewed and are negative.      Objective:    BP 118/64 mmHg  Pulse 66  Temp(Src) 98.1 F (36.7 C) (Oral)  Wt 134 lb 12 oz (61.122 kg)  Wt Readings from Last 3 Encounters:  03/19/16 134 lb 12 oz (61.122 kg)  01/17/16 135 lb 3.2 oz (61.326 kg)  09/25/15 134 lb 12 oz (61.122 kg)    Physical Exam  Constitutional: She is oriented to person, place, and time. She appears well-developed and well-nourished. No distress.  HENT:  Head: Normocephalic.  Eyes: Conjunctivae are normal.  Cardiovascular: Normal rate and regular rhythm.   Pulmonary/Chest: Effort normal and breath sounds normal. No respiratory distress. She has no wheezes. She has no rales.  Musculoskeletal: She exhibits no edema.  Neurological: She is alert and oriented to  person, place, and time.  Skin: Skin is warm and dry.  Psychiatric: She has a normal mood and affect. Her behavior is normal. Judgment and thought content normal.  Nursing note and vitals reviewed.         Assessment & Plan:   New onset type 2 diabetes mellitus ([place])  Essential hypertension  HLD (hyperlipidemia) No Follow-up on file.

## 2016-03-19 NOTE — Patient Instructions (Signed)
Great to see you.  We will call you with your results. 

## 2016-03-19 NOTE — Assessment & Plan Note (Signed)
Well controlled.  No changes made. 

## 2016-03-21 ENCOUNTER — Encounter: Payer: Self-pay | Admitting: *Deleted

## 2016-05-02 ENCOUNTER — Other Ambulatory Visit: Payer: Self-pay

## 2016-05-02 MED ORDER — LANSOPRAZOLE 30 MG PO CPDR: 30.0000 mg | DELAYED_RELEASE_CAPSULE | Freq: Every day | ORAL | 3 refills | Status: DC

## 2016-05-02 NOTE — Telephone Encounter (Signed)
Pt [facility] Do not see where Dr Deborra Medina has filled before. Last seen 03/19/16.Please advise.

## 2016-05-08 LAB — HM DIABETES EYE EXAM

## 2016-05-23 ENCOUNTER — Ambulatory Visit (INDEPENDENT_AMBULATORY_CARE_PROVIDER_SITE_OTHER): Payer: BLUE CROSS/BLUE SHIELD | Admitting: Internal Medicine

## 2016-05-23 ENCOUNTER — Encounter: Payer: Self-pay | Admitting: Internal Medicine

## 2016-05-23 DIAGNOSIS — J449 Chronic obstructive pulmonary disease, unspecified: Secondary | ICD-10-CM | POA: Diagnosis not present

## 2016-05-23 DIAGNOSIS — J209 Acute bronchitis, unspecified: Secondary | ICD-10-CM | POA: Diagnosis not present

## 2016-05-23 MED ORDER — DOXYCYCLINE HYCLATE 100 MG PO TABS: 100.0000 mg | ORAL_TABLET | Freq: Two times a day (BID) | ORAL | 0 refills | Status: DC

## 2016-05-23 NOTE — Assessment & Plan Note (Signed)
Not exacerbated 

## 2016-05-23 NOTE — Assessment & Plan Note (Signed)
Likely viral Discussed supportive care If worsens, can start the doxy

## 2016-05-23 NOTE — Progress Notes (Signed)
Pre visit review using our clinic review tool, if applicable. No additional management support is needed unless otherwise documented below in the visit note. 

## 2016-05-23 NOTE — Patient Instructions (Signed)
Start the antibiotic if you are worsening over the next few days. 

## 2016-05-23 NOTE — Progress Notes (Signed)
Subjective:    Patient ID: Monique Sanchez, female    DOB: 1950-11-10, 65 y.o.   MRN: [id number]  HPI Here due to respiratory illness Doesn't know if it is sinus or bronchitis  Fever, aches, stuffiness in chest and head Dry cough Started 3 days ago Some chills but no sweats No sig rhinorrhea or PND Cough mostly in day  Breathing seems okay No wheezing  Has tried mucinex, dayquil, nyquil No clear help  Current Outpatient Prescriptions on File Prior to Visit  Medication Sig Dispense Refill  . acetaminophen (TYLENOL) 500 MG tablet Take 500 mg by mouth daily as needed.    . budesonide-formoterol (SYMBICORT) 160-4.5 MCG/ACT inhaler Inhale 2 puffs into the lungs 2 (two) times daily. 3 Inhaler 3  . cetirizine (ZYRTEC) 10 MG tablet Take 10 mg by mouth at bedtime.    Marland Kitchen glucose blood test strip Check blood sugar once daily and as directed with onetouch ultra blue test strips. Dx E11.9 100 each 0  . hydrochlorothiazide (HYDRODIURIL) 25 MG tablet Take 1 tablet (25 mg total) by mouth daily. 90 tablet 3  . lansoprazole (PREVACID) 30 MG capsule Take 1 capsule (30 mg total) by mouth daily at 12 noon. 90 capsule 3  . metoprolol succinate (TOPROL-XL) 25 MG 24 hr tablet Take 1 tablet (25 mg total) by mouth daily. 90 tablet 3  . ONETOUCH DELICA LANCETS 99991111 MISC Check blood sugar once daily and as directed. Dx E11.9 100 each 0  . OVER THE COUNTER MEDICATION Phason- otc gas relief- prn    . rosuvastatin (CRESTOR) 5 MG tablet TAKE 1 TABLET DAILY 90 tablet 1  . Spacer/Aero-Holding Chambers (AEROCHAMBER MV) inhaler Use as instructed 1 each 0  . tiotropium (SPIRIVA) 18 MCG inhalation capsule Place 1 capsule (18 mcg total) into inhaler and inhale daily. 90 capsule 3   No current facility-administered medications on file prior to visit.     Allergies  Allergen Reactions  . Influenza Vaccines     Muscle cramps, shaking    Past Medical History:  Diagnosis Date  . Arthritis   . Blockage of  coronary artery of heart (HCC)    50% blockage single vessels  . Claustrophobia   . COPD (chronic obstructive pulmonary disease) ([place])   . Diverticular disease of colon 2010  . Hypercholesteremia   . Hypertension   . Osteoarthritis   . Seasonal allergies     Past Surgical History:  Procedure Laterality Date  . ABLATION ON ENDOMETRIOSIS  1976  . ANTERIOR CERVICAL DECOMP/DISCECTOMY FUSION  04/20/2014   Procedure: ANTERIOR CERVICAL DECOMPRESSION/DISCECTOMY FUSION 2 LEVELS;  Surgeon: Sinclair Ship, MD;  Location: [facility];  Service: Orthopedics;;  Anterior cervical decompression fusion, cervical 4-5, cervical 5-6 with instrumentation and allograft, possible C4 corpectomy.  Marland Kitchen CARDIAC CATHETERIZATION  2010   [facility]; Dr. Josefa Half  . CATARACT EXTRACTION W/ INTRAOCULAR LENS IMPLANT Left 05/2014  . 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  . CHOLECYSTECTOMY  2011  . COLON RESECTION     for diverticulitis  . COLON SURGERY Left 2010  . FOOT SURGERY  1995    Family History  Problem Relation Age of Onset  . Heart disease Mother   . Cancer Mother     lung metastatic site  . Heart disease Father   . Cancer Brother     lung  . Hyperlipidemia Brother   . Hypertension  Brother   . Breast cancer Paternal Grandmother     Social History   Social History  . Marital status: Single    Spouse name: N/A  . Number of children: N/A  . Years of education: N/A   Occupational History  . Not on file.   Social History Main Topics  . Smoking status: Former Smoker    Packs/day: 0.50    Years: 30.00    Types: Cigarettes    Quit date: 09/30/2007  . Smokeless tobacco: Never Used  . Alcohol use 0.0 oz/week     Comment: occasional wine  . Drug use: No  . Sexual activity: Not on file   Other Topics Concern  . Not on file   Social History Narrative  . No narrative on file   Review of  Systems No rash No vomiting or diarrhea Appetite is okay    Objective:   Physical Exam  Constitutional: She appears well-developed and well-nourished. No distress.  HENT:  Mouth/Throat: Oropharynx is clear and moist. No oropharyngeal exudate.  No sinus tenderness TMs normal Mild nasal inflammation  Neck: Normal range of motion. Neck supple. No thyromegaly present.  Pulmonary/Chest: Effort normal. No respiratory distress. She has no wheezes. She has no rales.  Decreased breath sounds but clear  Lymphadenopathy:    She has no cervical adenopathy.          Assessment & Plan:

## 2016-07-10 ENCOUNTER — Other Ambulatory Visit: Payer: Self-pay | Admitting: Orthopedic Surgery

## 2016-07-10 DIAGNOSIS — M5412 Radiculopathy, cervical region: Secondary | ICD-10-CM

## 2016-08-03 ENCOUNTER — Other Ambulatory Visit: Payer: BLUE CROSS/BLUE SHIELD

## 2016-08-05 ENCOUNTER — Telehealth: Payer: Self-pay

## 2016-08-05 NOTE — Telephone Encounter (Signed)
Ok to send in omeprazole 40 mg daily if pt is ok with this change.

## 2016-08-05 NOTE — Telephone Encounter (Signed)
[facility]; pt has been prescribed lansoprazole 30 mg and lansoprazole is not covered by plan unless specific medical reason; preferred alternative is omeprazole caps multiple strengths; if Dr Deborra Medina thinks appropriate to use alternative med [facility] or call [phone]. Request answer by end of day on 08/06/16.

## 2016-08-06 ENCOUNTER — Other Ambulatory Visit: Payer: Self-pay | Admitting: Family Medicine

## 2016-08-06 MED ORDER — OMEPRAZOLE 40 MG PO CPDR: 40.0000 mg | DELAYED_RELEASE_CAPSULE | Freq: Every day | ORAL | 1 refills | Status: DC

## 2016-08-06 NOTE — Telephone Encounter (Signed)
Spoke to pt and advised of med change. Pt is agreeable and new Rx sent to requested pharmacy

## 2016-08-11 ENCOUNTER — Ambulatory Visit
Admission: RE | Admit: 2016-08-11 | Discharge: 2016-08-11 | Disposition: A | Discharge status: Home / Self Care | Payer: BLUE CROSS/BLUE SHIELD | Source: Ambulatory Visit | Attending: Orthopedic Surgery | Admitting: Orthopedic Surgery

## 2016-08-11 DIAGNOSIS — M5412 Radiculopathy, cervical region: Secondary | ICD-10-CM

## 2016-09-15 ENCOUNTER — Ambulatory Visit (INDEPENDENT_AMBULATORY_CARE_PROVIDER_SITE_OTHER): Payer: BLUE CROSS/BLUE SHIELD | Admitting: Family Medicine

## 2016-09-15 ENCOUNTER — Encounter: Payer: Self-pay | Admitting: Family Medicine

## 2016-09-15 VITALS — BP 124/70 | HR 69 | Temp 98.0°F | Wt 137.0 lb

## 2016-09-15 DIAGNOSIS — J069 Acute upper respiratory infection, unspecified: Secondary | ICD-10-CM

## 2016-09-15 NOTE — Progress Notes (Signed)
SUBJECTIVE:  Monique Sanchez is a 65 y.o. female who complains of coryza and congestion for 3 days. She denies a history of anorexia and chest pain and denies a history of asthma. Patient denies smoke cigarettes.   Current Outpatient Prescriptions on File Prior to Visit  Medication Sig Dispense Refill  . acetaminophen (TYLENOL) 500 MG tablet Take 500 mg by mouth daily as needed.    . budesonide-formoterol (SYMBICORT) 160-4.5 MCG/ACT inhaler Inhale 2 puffs into the lungs 2 (two) times daily. 3 Inhaler 3  . cetirizine (ZYRTEC) 10 MG tablet Take 10 mg by mouth at bedtime.    Marland Kitchen glucose blood test strip Check blood sugar once daily and as directed with onetouch ultra blue test strips. Dx E11.9 100 each 0  . hydrochlorothiazide (HYDRODIURIL) 25 MG tablet TAKE 1 TABLET DAILY 90 tablet 1  . lansoprazole (PREVACID) 30 MG capsule Take 1 capsule (30 mg total) by mouth daily at 12 noon. 90 capsule 3  . metoprolol succinate (TOPROL-XL) 25 MG 24 hr tablet Take 1 tablet (25 mg total) by mouth daily. 90 tablet 3  . omeprazole (PRILOSEC) 40 MG capsule Take 1 capsule (40 mg total) by mouth daily. 90 capsule 1  . ONETOUCH DELICA LANCETS 99991111 MISC Check blood sugar once daily and as directed. Dx E11.9 100 each 0  . OVER THE COUNTER MEDICATION Phason- otc gas relief- prn    . rosuvastatin (CRESTOR) 5 MG tablet TAKE 1 TABLET DAILY 90 tablet 1  . Spacer/Aero-Holding Chambers (AEROCHAMBER MV) inhaler Use as instructed 1 each 0  . tiotropium (SPIRIVA) 18 MCG inhalation capsule Place 1 capsule (18 mcg total) into inhaler and inhale daily. 90 capsule 3   No current facility-administered medications on file prior to visit.     Allergies  Allergen Reactions  . Influenza Vaccines     Muscle cramps, shaking    Past Medical History:  Diagnosis Date  . Arthritis   . Blockage of coronary artery of heart (HCC)    50% blockage single vessels  . Claustrophobia   . COPD (chronic obstructive pulmonary disease) ([place])    . Diverticular disease of colon 2010  . Hypercholesteremia   . Hypertension   . Osteoarthritis   . Seasonal allergies     Past Surgical History:  Procedure Laterality Date  . ABLATION ON ENDOMETRIOSIS  1976  . ANTERIOR CERVICAL DECOMP/DISCECTOMY FUSION  04/20/2014   Procedure: ANTERIOR CERVICAL DECOMPRESSION/DISCECTOMY FUSION 2 LEVELS;  Surgeon: Sinclair Ship, MD;  Location: [facility];  Service: Orthopedics;;  Anterior cervical decompression fusion, cervical 4-5, cervical 5-6 with instrumentation and allograft, possible C4 corpectomy.  Marland Kitchen CARDIAC CATHETERIZATION  2010    [facility]; Dr. Josefa Half  . CATARACT EXTRACTION W/ INTRAOCULAR LENS IMPLANT Left 05/2014  . 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  . CHOLECYSTECTOMY  2011  . COLON RESECTION     for diverticulitis  . COLON SURGERY Left 2010  . FOOT SURGERY  1995    Family History  Problem Relation Age of Onset  . Heart disease Mother   . Cancer Mother     lung metastatic site  . Heart disease Father   . Cancer Brother     lung  . Hyperlipidemia Brother   . Hypertension Brother   . Breast cancer Paternal Grandmother     Social History   Social History  . Marital status: Single    Spouse  name: N/A  . Number of children: N/A  . Years of education: N/A   Occupational History  . Not on file.   Social History Main Topics  . Smoking status: Former Smoker    Packs/day: 0.50    Years: 30.00    Types: Cigarettes    Quit date: 09/30/2007  . Smokeless tobacco: Never Used  . Alcohol use 0.0 oz/week     Comment: occasional wine  . Drug use: No  . Sexual activity: Not on file   Other Topics Concern  . Not on file   Social History Narrative  . No narrative on file   The PMH, PSH, Social History, Family History, Medications, and allergies have been reviewed in [facility], and have been updated if relevant.  OBJECTIVE: BP  124/70   Pulse 69   Temp 98 F (36.7 C) (Oral)   Wt 137 lb (62.1 kg)   SpO2 97%   BMI 24.66 kg/m   She appears well, vital signs are as noted. Ears normal.  Throat and pharynx normal.  Neck supple. No adenopathy in the neck. Nose is congested. Sinuses non tender. The chest is clear, without wheezes or rales.  ASSESSMENT:  viral upper respiratory illness  PLAN: Symptomatic therapy suggested: push fluids, rest and return office visit prn if symptoms persist or worsen. Lack of antibiotic effectiveness discussed with her. Call or return to clinic prn if these symptoms worsen or fail to improve as anticipated.

## 2016-09-15 NOTE — Progress Notes (Signed)
Pre visit review using our clinic review tool, if applicable. No additional management support is needed unless otherwise documented below in the visit note. 

## 2016-09-15 NOTE — Patient Instructions (Signed)
Great to see you. Happy Holidays!  Please call if your symptoms do not improve over the next week as expected.

## 2016-09-17 ENCOUNTER — Telehealth: Payer: Self-pay

## 2016-09-17 MED ORDER — DOXYCYCLINE HYCLATE 100 MG PO TABS: 100.0000 mg | ORAL_TABLET | Freq: Two times a day (BID) | ORAL | 0 refills | Status: DC

## 2016-09-17 NOTE — Telephone Encounter (Signed)
eRx for doxycyline sent.  I hope she feels better.  Keep [place] updated.

## 2016-09-17 NOTE — Telephone Encounter (Signed)
Pt last seen on 09/15/16; pt is not feeling any better;congested head, prod cough with green phlegm,hoarseness but no S/T; having a lot of drainage at back of throat. Low grade fever. Pt request abx to [facility].

## 2016-09-19 ENCOUNTER — Other Ambulatory Visit: Payer: Self-pay | Admitting: Internal Medicine

## 2016-09-19 MED ORDER — AMOXICILLIN 875 MG PO TABS
875.0000 mg | ORAL_TABLET | Freq: Two times a day (BID) | ORAL | 0 refills | Status: DC
Start: 2016-09-19 — End: 2017-02-03

## 2016-09-19 NOTE — Telephone Encounter (Signed)
rx for Amoxil sent to pharmacy

## 2016-09-19 NOTE — Telephone Encounter (Signed)
Pt left v/m abx doxycycline is making pt very nauseated and request different abx to [facility]. (have not added to allergy list until approved)the patient request cb.

## 2016-10-07 ENCOUNTER — Telehealth: Payer: Self-pay

## 2016-10-07 MED ORDER — METHOCARBAMOL 500 MG PO TABS: 500.0000 mg | ORAL_TABLET | Freq: Four times a day (QID) | ORAL | 0 refills | Status: DC

## 2016-10-07 NOTE — Telephone Encounter (Signed)
Pt left v/m; pt was seen 09/15/16 and [facility]16. It took several days to get abx from mail order and pt was better so pt did not take abx; now pt worse again; 09/22/16 pt hurt her hip and is taking methocarbamol and meloxicam. Pt wants to know if she can take amoxicillin while taking methocarbamol and meloxicam. Pt request cb.

## 2016-10-07 NOTE — Telephone Encounter (Signed)
I am so sorry for there error.  Yes she can take amoxicillin with those other medications.

## 2016-10-08 NOTE — Telephone Encounter (Signed)
Lm on pts vm and advised per Dr Aron.  

## 2017-01-15 ENCOUNTER — Other Ambulatory Visit: Payer: Self-pay | Admitting: Family Medicine

## 2017-01-16 ENCOUNTER — Other Ambulatory Visit: Payer: Self-pay | Admitting: Family Medicine

## 2017-02-03 ENCOUNTER — Encounter: Payer: Self-pay | Admitting: Pulmonary Disease

## 2017-02-03 ENCOUNTER — Ambulatory Visit (INDEPENDENT_AMBULATORY_CARE_PROVIDER_SITE_OTHER): Payer: BLUE CROSS/BLUE SHIELD | Admitting: Pulmonary Disease

## 2017-02-03 DIAGNOSIS — J449 Chronic obstructive pulmonary disease, unspecified: Secondary | ICD-10-CM

## 2017-02-03 MED ORDER — FLUTICASONE-UMECLIDIN-VILANT 100-62.5-25 MCG/INH IN AEPB: 1.0000 | INHALATION_SPRAY | Freq: Every day | RESPIRATORY_TRACT | 3 refills | Status: DC

## 2017-02-03 MED ORDER — AEROCHAMBER MV MISC: 0 refills | Status: DC

## 2017-02-03 MED ORDER — FLUTICASONE-UMECLIDIN-VILANT 100-62.5-25 MCG/INH IN AEPB: 1.0000 | INHALATION_SPRAY | Freq: Every day | RESPIRATORY_TRACT | 0 refills | Status: DC

## 2017-02-03 NOTE — Assessment & Plan Note (Signed)
This has been a stable interval for [name]. There she has severe disease on spirometry, she remains stable on Symbicort and Spiriva without exacerbations.  She would like to change to the new inhaler Trelegy due to its convenience of combining all of her controller medicines into one.  Plan: Change Symbicort and Spiriva to Trelegy Follow-up one year or sooner if needed

## 2017-02-03 NOTE — Progress Notes (Signed)
Subjective:    Patient ID: Monique Sanchez, female    DOB: Jan 01, 1951, 66 y.o.   MRN: [id number]  Synopsis: First saw [facility] for GOLD Grade C COPD in 2015 01/2014 Simple spirometry> Ratio 36%, FEV1  0.54L  (24% pred) Allergic to flu shot. Due to this she is reluctant to take the pneumonia vaccine.   HPI  Chief Complaint  Patient presents with  . Follow-up    pt doing well, states she is having difficulty with seasonal allergies.    Monique Sanchez says that the pollen is given her more congestion, drippy nose symptoms.  She is taking nasacort regulalry.   She has a little hoarseness related to the pollen and post nasal drip as well.  No respiratory infection in the last year, no bronchitis, no pneumonia. No flu this year.   She still takes spiriva and symbicort.    Past Medical History:  Diagnosis Date  . Arthritis   . Blockage of coronary artery of heart (HCC)    50% blockage single vessels  . Claustrophobia   . COPD (chronic obstructive pulmonary disease) ([place])   . Diverticular disease of colon 2010  . Hypercholesteremia   . Hypertension   . Osteoarthritis   . Seasonal allergies      Review of Systems  Constitutional: Negative for chills, fatigue and fever.  HENT: Positive for postnasal drip. Negative for nosebleeds and rhinorrhea.   Respiratory: Negative for cough, shortness of breath and wheezing.   Cardiovascular: Negative for chest pain, palpitations and leg swelling.       Objective:   Physical Exam  Vitals:   02/03/17 1645  BP: 128/66  Pulse: 72  SpO2: 98%  Weight: 136 lb (61.7 kg)  Height: 5' 2.5" (1.588 m)  RA  Gen: well appearing HENT: OP clear, TM's clear, neck supple PULM: CTA B, normal percussion CV: RRR, no mgr, trace edema GI: BS+, soft, nontender Derm: no cyanosis or rash Psyche: normal mood and affect      Assessment & Plan:   COPD Grade C This has been a stable interval for [name]. There she has severe disease on spirometry, she  remains stable on Symbicort and Spiriva without exacerbations.  She would like to change to the new inhaler Trelegy due to its convenience of combining all of her controller medicines into one.  Plan: Change Symbicort and Spiriva to Trelegy Follow-up one year or sooner if needed   Updated Medication List Outpatient Encounter Prescriptions as of 02/03/2017  Medication Sig  . acetaminophen (TYLENOL) 500 MG tablet Take 500 mg by mouth daily as needed.  . budesonide-formoterol (SYMBICORT) 160-4.5 MCG/ACT inhaler Inhale 2 puffs into the lungs 2 (two) times daily.  . cetirizine (ZYRTEC) 10 MG tablet Take 10 mg by mouth at bedtime.  Marland Kitchen glucose blood test strip Check blood sugar once daily and as directed with onetouch ultra blue test strips. Dx E11.9  . hydrochlorothiazide (HYDRODIURIL) 25 MG tablet TAKE 1 TABLET DAILY  . lansoprazole (PREVACID) 30 MG capsule Take 1 capsule (30 mg total) by mouth daily at 12 noon.  . methocarbamol (ROBAXIN) 500 MG tablet Take 1 tablet (500 mg total) by mouth 4 (four) times daily.  . metoprolol succinate (TOPROL-XL) 25 MG 24 hr tablet TAKE 1 TABLET DAILY  . omeprazole (PRILOSEC) 40 MG capsule Take 1 capsule (40 mg total) by mouth daily.  Glory Rosebush DELICA LANCETS 89Q MISC Check blood sugar once daily and as directed. Dx E11.9  . OVER THE  COUNTER MEDICATION Phason- otc gas relief- prn  . rosuvastatin (CRESTOR) 5 MG tablet TAKE 1 TABLET DAILY  . Spacer/Aero-Holding Chambers (AEROCHAMBER MV) inhaler Use as instructed  . tiotropium (SPIRIVA) 18 MCG inhalation capsule Place 1 capsule (18 mcg total) into inhaler and inhale daily.  . [DISCONTINUED] Spacer/Aero-Holding Chambers (AEROCHAMBER MV) inhaler Use as instructed  . Fluticasone-Umeclidin-Vilant (TRELEGY ELLIPTA) 100-62.5-25 MCG/INH AEPB Inhale 1 puff into the lungs daily.  . Fluticasone-Umeclidin-Vilant (TRELEGY ELLIPTA) 100-62.5-25 MCG/INH AEPB Inhale 1 puff into the lungs daily.  . [DISCONTINUED] amoxicillin  (AMOXIL) 875 MG tablet Take 1 tablet (875 mg total) by mouth 2 (two) times daily. (Patient not taking: Reported on 02/03/2017)  . [DISCONTINUED] doxycycline (VIBRA-TABS) 100 MG tablet Take 1 tablet (100 mg total) by mouth 2 (two) times daily. (Patient not taking: Reported on 02/03/2017)   No facility-administered encounter medications on file as of 02/03/2017.

## 2017-02-03 NOTE — Patient Instructions (Signed)
Take Trelegy 1 puff daily no matter how you feel If the pharmacist says that your insurance company doesn't cover this let me know We will see you back in one year or sooner if needed

## 2017-02-04 ENCOUNTER — Other Ambulatory Visit: Payer: Self-pay | Admitting: Family Medicine

## 2017-02-04 DIAGNOSIS — E119 Type 2 diabetes mellitus without complications: Secondary | ICD-10-CM

## 2017-02-04 DIAGNOSIS — E785 Hyperlipidemia, unspecified: Secondary | ICD-10-CM

## 2017-02-04 DIAGNOSIS — I10 Essential (primary) hypertension: Secondary | ICD-10-CM

## 2017-02-09 ENCOUNTER — Other Ambulatory Visit (INDEPENDENT_AMBULATORY_CARE_PROVIDER_SITE_OTHER): Payer: BLUE CROSS/BLUE SHIELD

## 2017-02-09 ENCOUNTER — Other Ambulatory Visit: Payer: Self-pay | Admitting: Family Medicine

## 2017-02-09 DIAGNOSIS — I10 Essential (primary) hypertension: Secondary | ICD-10-CM | POA: Diagnosis not present

## 2017-02-09 DIAGNOSIS — E785 Hyperlipidemia, unspecified: Secondary | ICD-10-CM | POA: Diagnosis not present

## 2017-02-09 DIAGNOSIS — E119 Type 2 diabetes mellitus without complications: Secondary | ICD-10-CM

## 2017-02-09 LAB — CBC WITH DIFFERENTIAL/PLATELET
BASOS PCT: 0.9 % (ref 0.0–3.0)
Basophils Absolute: 0.1 10*3/uL (ref 0.0–0.1)
EOS PCT: 2.2 % (ref 0.0–5.0)
Eosinophils Absolute: 0.1 10*3/uL (ref 0.0–0.7)
HEMATOCRIT: 43.9 % (ref 36.0–46.0)
Hemoglobin: 14.4 g/dL (ref 12.0–15.0)
LYMPHS ABS: 1.5 10*3/uL (ref 0.7–4.0)
LYMPHS PCT: 23.2 % (ref 12.0–46.0)
MCHC: 32.8 g/dL (ref 30.0–36.0)
MCV: 93.3 fl (ref 78.0–100.0)
MONOS PCT: 13.3 % — AB (ref 3.0–12.0)
Monocytes Absolute: 0.9 10*3/uL (ref 0.1–1.0)
NEUTROS ABS: 4 10*3/uL (ref 1.4–7.7)
NEUTROS PCT: 60.4 % (ref 43.0–77.0)
PLATELETS: 285 10*3/uL (ref 150.0–400.0)
RBC: 4.71 Mil/uL (ref 3.87–5.11)
RDW: 12.4 % (ref 11.5–15.5)
WBC: 6.5 10*3/uL (ref 4.0–10.5)

## 2017-02-09 LAB — COMPREHENSIVE METABOLIC PANEL
ALT: 20 U/L (ref 0–35)
AST: 23 U/L (ref 0–37)
Albumin: 4.1 g/dL (ref 3.5–5.2)
Alkaline Phosphatase: 80 U/L (ref 39–117)
BUN: 18 mg/dL (ref 6–23)
CALCIUM: 9 mg/dL (ref 8.4–10.5)
CO2: 31 meq/L (ref 19–32)
Chloride: 97 mEq/L (ref 96–112)
Creatinine, Ser: 0.76 mg/dL (ref 0.40–1.20)
GFR: 80.62 mL/min (ref >60.00)
GLUCOSE: 110 mg/dL — AB (ref 70–99)
POTASSIUM: 3.8 meq/L (ref 3.5–5.1)
Sodium: 134 mEq/L — ABNORMAL LOW (ref 135–145)
Total Bilirubin: 0.9 mg/dL (ref 0.2–1.2)
Total Protein: 6.4 g/dL (ref 6.0–8.3)

## 2017-02-09 LAB — TSH: TSH: 1.14 u[IU]/mL (ref 0.35–4.50)

## 2017-02-09 LAB — LIPID PANEL
CHOL/HDL RATIO: 3
Cholesterol: 166 mg/dL (ref 0–200)
HDL: 65.4 mg/dL (ref >39.00)
LDL Cholesterol: 83 mg/dL (ref 0–99)
NonHDL: 100.78
TRIGLYCERIDES: 89 mg/dL (ref 0.0–149.0)
VLDL: 17.8 mg/dL (ref 0.0–40.0)

## 2017-02-09 LAB — HEMOGLOBIN A1C: Hgb A1c MFr Bld: 6.6 % — ABNORMAL HIGH (ref 4.6–6.5)

## 2017-02-12 ENCOUNTER — Ambulatory Visit (INDEPENDENT_AMBULATORY_CARE_PROVIDER_SITE_OTHER): Payer: BLUE CROSS/BLUE SHIELD | Admitting: Family Medicine

## 2017-02-12 ENCOUNTER — Encounter: Payer: Self-pay | Admitting: Family Medicine

## 2017-02-12 VITALS — BP 110/62 | HR 75 | Temp 98.3°F | Ht 62.0 in | Wt 134.0 lb

## 2017-02-12 DIAGNOSIS — Z1211 Encounter for screening for malignant neoplasm of colon: Secondary | ICD-10-CM

## 2017-02-12 DIAGNOSIS — Z01419 Encounter for gynecological examination (general) (routine) without abnormal findings: Secondary | ICD-10-CM | POA: Diagnosis not present

## 2017-02-12 DIAGNOSIS — I10 Essential (primary) hypertension: Secondary | ICD-10-CM | POA: Diagnosis not present

## 2017-02-12 DIAGNOSIS — E119 Type 2 diabetes mellitus without complications: Secondary | ICD-10-CM

## 2017-02-12 DIAGNOSIS — J449 Chronic obstructive pulmonary disease, unspecified: Secondary | ICD-10-CM

## 2017-02-12 DIAGNOSIS — E785 Hyperlipidemia, unspecified: Secondary | ICD-10-CM

## 2017-02-12 NOTE — Assessment & Plan Note (Signed)
Reviewed preventive care protocols, scheduled due services, and updated immunizations Discussed nutrition, exercise, diet, and healthy lifestyle.  Refer to GI for colonoscopy. 

## 2017-02-12 NOTE — Patient Instructions (Signed)
Great to see you.  Please stop by to see [name] on your way out or we can call you with your GI appointment.

## 2017-02-12 NOTE — Assessment & Plan Note (Signed)
Deteriorated but still relatively diet controlled. She admits to not following as strict of diabetic diet and will restart one now. Follow up a1c in 3-6 months. Foot exam today. Declining prevnar 13.

## 2017-02-12 NOTE — Progress Notes (Signed)
Subjective:   Patient ID: LOA IDLER, female    DOB: [birth year], 66 y.o.   MRN: [id number]  Monique Sanchez is a pleasant 66 y.o. year old female who presents to clinic today with Annual Exam  on 02/12/2017  HPI:  Mammogram 07/20/15  Overdue for Colonoscopy- saw Dr. Tiffany Kocher last time.  DM- diagnosed in 10/2014.   At that  time, started her on Metformin 500 mg daily and referred to diabetic teaching which she did attend.  Stopped metformin 3 months.  Checking FSBS twice daily. Brings log with her today. Ranging around 120s. Denies any episodes of hypoglycemia. Neg urine micro 02/05/15  Has declined pneumococcal vaccination.  On statin.  Lab Results  Component Value Date   HGBA1C 6.6 (H) 02/09/2017   Current Outpatient Prescriptions on File Prior to Visit  Medication Sig Dispense Refill  . acetaminophen (TYLENOL) 500 MG tablet Take 500 mg by mouth daily as needed.    . budesonide-formoterol (SYMBICORT) 160-4.5 MCG/ACT inhaler Inhale 2 puffs into the lungs 2 (two) times daily. 3 Inhaler 3  . cetirizine (ZYRTEC) 10 MG tablet Take 10 mg by mouth at bedtime.    . Fluticasone-Umeclidin-Vilant (TRELEGY ELLIPTA) 100-62.5-25 MCG/INH AEPB Inhale 1 puff into the lungs daily. 1 each 0  . Fluticasone-Umeclidin-Vilant (TRELEGY ELLIPTA) 100-62.5-25 MCG/INH AEPB Inhale 1 puff into the lungs daily. 180 each 3  . glucose blood test strip Check blood sugar once daily and as directed with onetouch ultra blue test strips. Dx E11.9 100 each 0  . hydrochlorothiazide (HYDRODIURIL) 25 MG tablet TAKE 1 TABLET DAILY 90 tablet 1  . lansoprazole (PREVACID) 30 MG capsule Take 1 capsule (30 mg total) by mouth daily at 12 noon. 90 capsule 3  . methocarbamol (ROBAXIN) 500 MG tablet Take 1 tablet (500 mg total) by mouth 4 (four) times daily. 30 tablet 0  . metoprolol succinate (TOPROL-XL) 25 MG 24 hr tablet TAKE 1 TABLET DAILY 90 tablet 0  . omeprazole (PRILOSEC) 40 MG capsule TAKE 1 CAPSULE DAILY 90  capsule 1  . ONETOUCH DELICA LANCETS 07P MISC Check blood sugar once daily and as directed. Dx E11.9 100 each 0  . OVER THE COUNTER MEDICATION Phason- otc gas relief- prn    . rosuvastatin (CRESTOR) 5 MG tablet TAKE 1 TABLET DAILY 90 tablet 0  . Spacer/Aero-Holding Chambers (AEROCHAMBER MV) inhaler Use as instructed 1 each 0  . tiotropium (SPIRIVA) 18 MCG inhalation capsule Place 1 capsule (18 mcg total) into inhaler and inhale daily. 90 capsule 3   No current facility-administered medications on file prior to visit.     Allergies  Allergen Reactions  . Influenza Vaccines     Muscle cramps, shaking    Past Medical History:  Diagnosis Date  . Arthritis   . Blockage of coronary artery of heart (HCC)    50% blockage single vessels  . Claustrophobia   . COPD (chronic obstructive pulmonary disease) ([place])   . Diverticular disease of colon 2010  . Hypercholesteremia   . Hypertension   . Osteoarthritis   . Seasonal allergies     Past Surgical History:  Procedure Laterality Date  . ABLATION ON ENDOMETRIOSIS  1976  . ANTERIOR CERVICAL DECOMP/DISCECTOMY FUSION  04/20/2014   Procedure: ANTERIOR CERVICAL DECOMPRESSION/DISCECTOMY FUSION 2 LEVELS;  Surgeon: Sinclair Ship, MD;  Location: [facility];  Service: Orthopedics;;  Anterior cervical decompression fusion, cervical 4-5, cervical 5-6 with instrumentation and allograft, possible C4 corpectomy.  Marland Kitchen CARDIAC CATHETERIZATION  2010  [facility]; Dr. Josefa Half  . CATARACT EXTRACTION W/ INTRAOCULAR LENS IMPLANT Left 05/2014  . 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  . CHOLECYSTECTOMY  2011  . COLON RESECTION     for diverticulitis  . COLON SURGERY Left 2010  . FOOT SURGERY  1995    Family History  Problem Relation Age of Onset  . Heart disease Mother   . Cancer Mother        lung metastatic site  . Heart disease Father   . Cancer  Brother        lung  . Hyperlipidemia Brother   . Hypertension Brother   . Breast cancer Paternal Grandmother     Social History   Social History  . Marital status: Single    Spouse name: N/A  . Number of children: N/A  . Years of education: N/A   Occupational History  . Not on file.   Social History Main Topics  . Smoking status: Former Smoker    Packs/day: 0.50    Years: 30.00    Types: Cigarettes    Quit date: 09/30/2007  . Smokeless tobacco: Never Used  . Alcohol use 0.0 oz/week     Comment: occasional wine  . Drug use: No  . Sexual activity: Not on file   Other Topics Concern  . Not on file   Social History Narrative  . No narrative on file   The PMH, PSH, Social History, Family History, Medications, and allergies have been reviewed in [facility], and have been updated if relevant.   Review of Systems  Constitutional: Negative.   HENT: Negative.   Eyes: Negative.   Respiratory: Negative.   Cardiovascular: Negative.   Gastrointestinal: Negative.   Endocrine: Negative.   Genitourinary: Negative.   Musculoskeletal: Negative.   Allergic/Immunologic: Negative.   Neurological: Negative.   Hematological: Negative.   Psychiatric/Behavioral: Negative.   All other systems reviewed and are negative.      Objective:    BP 110/62   Pulse 75   Temp 98.3 F (36.8 C)   Ht 5\' 2"  (1.575 m)   Wt 134 lb (60.8 kg)   SpO2 99%   BMI 24.51 kg/m    Physical Exam   General:  Well-developed,well-nourished,in no acute distress; alert,appropriate and cooperative throughout examination Head:  normocephalic and atraumatic.   Eyes:  vision grossly intact, PERRL Ears:  R ear normal and L ear normal externally, TMs clear bilaterally Nose:  no external deformity.   Mouth:  good dentition.   Neck:  No deformities, masses, or tenderness noted. Breasts:  No mass, nodules, thickening, tenderness, bulging, retraction, inflamation, nipple discharge or skin changes noted.   Lungs:   Normal respiratory effort, chest expands symmetrically. Lungs are clear to auscultation, no crackles or wheezes. Heart:  Normal rate and regular rhythm. S1 and S2 normal without gallop, murmur, click, rub or other extra sounds. Abdomen:  Bowel sounds positive,abdomen soft and non-tender without masses, organomegaly or hernias noted. Msk:  No deformity or scoliosis noted of thoracic or lumbar spine.   Extremities:  No clubbing, cyanosis, edema, or deformity noted with normal full range of motion of all joints.   Neurologic:  alert & oriented X3 and gait normal.   Skin:  Intact without suspicious lesions or rashes Psych:  Cognition and judgment appear intact. Alert and cooperative with normal attention span and concentration. No apparent delusions, illusions, hallucinations  Assessment & Plan:   Screening for colon cancer - Plan: Ambulatory referral to Gastroenterology  Well woman exam  Hyperlipidemia, unspecified hyperlipidemia type  Essential hypertension  New onset type 2 diabetes mellitus ([place])  COPD Grade C No Follow-up on file.

## 2017-02-12 NOTE — Assessment & Plan Note (Signed)
At goal for a diabetic. No changes made to rxs. 

## 2017-02-12 NOTE — Assessment & Plan Note (Signed)
Normotenisve. No changes made today.

## 2017-02-24 ENCOUNTER — Telehealth: Payer: Self-pay | Admitting: Pulmonary Disease

## 2017-02-24 MED ORDER — FLUTICASONE-UMECLIDIN-VILANT 100-62.5-25 MCG/INH IN AEPB: 1.0000 | INHALATION_SPRAY | Freq: Every day | RESPIRATORY_TRACT | 3 refills | Status: DC

## 2017-02-24 NOTE — Telephone Encounter (Signed)
Spoke with pt, wanting to know the cost of her Trelegy copay.  I advised that we did not have a way of knowing the copay, but that the pharmacy could inform her of her copay prior to filling rx.  rx sent to preferred pharmacy.  Advised pt to contact our office if she has any difficulty filling rx.  Pt expressed understanding.  Nothing further needed.

## 2017-02-24 NOTE — Telephone Encounter (Signed)
lmomtcb x 1 for the pt to call back .  

## 2017-02-27 ENCOUNTER — Telehealth: Payer: Self-pay | Admitting: Pulmonary Disease

## 2017-02-27 NOTE — Telephone Encounter (Signed)
Patient needs a PA on Trelegy. Due to it being after 5, will start PA on Monday morning.

## 2017-03-03 MED ORDER — FLUTICASONE-UMECLIDIN-VILANT 100-62.5-25 MCG/INH IN AEPB: 1.0000 | INHALATION_SPRAY | Freq: Every day | RESPIRATORY_TRACT | 0 refills | Status: DC

## 2017-03-03 NOTE — Telephone Encounter (Signed)
Pt returning call I let her know that sample was here for her to pick up and also let her know that PA has been started and would take a/b 48hr to complete, she was fine w/that and said that she would pick-up sample in the morning.Hillery Hunter

## 2017-03-03 NOTE — Telephone Encounter (Signed)
PA has been started. Key in [facility] is [id number]. Should have an decision in 48hrs.

## 2017-03-03 NOTE — Telephone Encounter (Signed)
Will forward back to [facility] to await approval/denial, thanks

## 2017-03-03 NOTE — Telephone Encounter (Signed)
lmtcb X1 for pt. One sample of trelegy left up front for pt.  Cherina please advise if PA has been started.  Thanks!

## 2017-03-03 NOTE — Telephone Encounter (Signed)
Patient called to check on status of PA, advised this is still pending.  She states she has 2 days left of the sample she recd of Trelegy.  She is asking if we have any samples.  CB for patient is [phone] (cell) and [phone], option 2 (work).

## 2017-03-06 NOTE — Telephone Encounter (Signed)
AC please advise. Thanks

## 2017-03-06 NOTE — Telephone Encounter (Signed)
Checked status of PA, this is still pending.  Will continue to follow up on.

## 2017-03-09 ENCOUNTER — Other Ambulatory Visit: Payer: Self-pay | Admitting: Pulmonary Disease

## 2017-03-09 NOTE — Telephone Encounter (Signed)
PA for Trelegy has been approved from 03/03/2017-03/04/2018.  Pharmacy is aware.   Nothing further needed.

## 2017-03-16 ENCOUNTER — Telehealth: Payer: Self-pay | Admitting: Pulmonary Disease

## 2017-03-16 MED ORDER — BUDESONIDE-FORMOTEROL FUMARATE 80-4.5 MCG/ACT IN AERO: 2.0000 | INHALATION_SPRAY | Freq: Two times a day (BID) | RESPIRATORY_TRACT | 5 refills | Status: DC

## 2017-03-16 MED ORDER — BUDESONIDE-FORMOTEROL FUMARATE 80-4.5 MCG/ACT IN AERO: 2.0000 | INHALATION_SPRAY | Freq: Two times a day (BID) | RESPIRATORY_TRACT | 0 refills | Status: DC

## 2017-03-16 MED ORDER — BUDESONIDE-FORMOTEROL FUMARATE 80-4.5 MCG/ACT IN AERO: 2.0000 | INHALATION_SPRAY | Freq: Two times a day (BID) | RESPIRATORY_TRACT | 3 refills | Status: DC

## 2017-03-16 NOTE — Telephone Encounter (Signed)
rx sent to pharmacy on file. lmtcb X1 to make pt aware.

## 2017-03-16 NOTE — Telephone Encounter (Signed)
rx has been sent to preferred pharmacy and samples have been left up front for pt.  Attempted to call patient on work number, could not dial through prompts to speak to an associate.   Spoke with pt on cell phone, aware of samples and rx.  Nothing further needed.

## 2017-03-16 NOTE — Telephone Encounter (Signed)
Spoke with pt. States that she needs a refill on Symbicort. At her last OV, BQ changed her to Trelegy but this prescription is going to cost to much and would like to go back on Symbicort.  BQ - please advise. Thanks.

## 2017-03-16 NOTE — Telephone Encounter (Signed)
OK by me 28mcg/4.5 2 puff bid

## 2017-03-16 NOTE — Telephone Encounter (Signed)
Patient called states that message Caryl Pina states that rx for symbicort was going to [facility] - rx needs to go to [facility] for 90 day supply - pt states that it was also discussed that she will have samples of Symbicort waiting for her until she can get the meds delivered. She would like to be called back at work [phone] - pt works at [employer] so whoever answers just ask for her and they will get her to the phone -pr

## 2017-03-18 ENCOUNTER — Telehealth: Payer: Self-pay | Admitting: Pulmonary Disease

## 2017-03-18 MED ORDER — BUDESONIDE-FORMOTEROL FUMARATE 160-4.5 MCG/ACT IN AERO: 2.0000 | INHALATION_SPRAY | Freq: Two times a day (BID) | RESPIRATORY_TRACT | 3 refills | Status: DC

## 2017-03-18 MED ORDER — BUDESONIDE-FORMOTEROL FUMARATE 160-4.5 MCG/ACT IN AERO: 2.0000 | INHALATION_SPRAY | Freq: Two times a day (BID) | RESPIRATORY_TRACT | 0 refills | Status: DC

## 2017-03-18 NOTE — Telephone Encounter (Signed)
Per BQ---  Ok to change the pt back to symbicort 160 if this works better for her.  This has been done and I have left samples up front for the pt to pick up today.

## 2017-03-18 NOTE — Telephone Encounter (Signed)
Called and spoke with pt and she stated that she picked up the samples of the symbicort but she was given the 80/4.5 instead of the 160/4.5.  Pt stated that she is going out of town tomorrow and does not want to end up in the hospital somewhere.  Pt stated that she has a sample of the trelegy and this works great but is way too expensive.  Pt stated that she can come and pick up samples of either but will need to do this before 4 pm as she will not be able to leave work after that.  BQ please advise. Thanks   Allergies  Allergen Reactions  . Influenza Vaccines     Muscle cramps, shaking

## 2017-03-20 NOTE — Telephone Encounter (Signed)
Addendum: Samples for Symbicort 70mcg were put back on the shelf. These were never picked up d/t regimen being changed.  Samples have been logged back in and put back on the shelf. Nothing further needed.

## 2017-04-21 ENCOUNTER — Other Ambulatory Visit: Payer: Self-pay | Admitting: Family Medicine

## 2017-05-11 ENCOUNTER — Other Ambulatory Visit: Payer: Self-pay | Admitting: Family Medicine

## 2017-05-11 ENCOUNTER — Encounter: Payer: Self-pay | Admitting: Family Medicine

## 2017-05-11 LAB — HM DIABETES EYE EXAM

## 2017-05-14 ENCOUNTER — Other Ambulatory Visit: Payer: Self-pay | Admitting: Orthopedic Surgery

## 2017-05-14 DIAGNOSIS — M25511 Pain in right shoulder: Secondary | ICD-10-CM

## 2017-05-28 ENCOUNTER — Ambulatory Visit
Admission: RE | Admit: 2017-05-28 | Discharge: 2017-05-28 | Disposition: A | Discharge status: Home / Self Care | Payer: BLUE CROSS/BLUE SHIELD | Source: Ambulatory Visit | Attending: Orthopedic Surgery | Admitting: Orthopedic Surgery

## 2017-05-28 DIAGNOSIS — M25511 Pain in right shoulder: Secondary | ICD-10-CM

## 2017-06-05 DIAGNOSIS — M67911 Unspecified disorder of synovium and tendon, right shoulder: Secondary | ICD-10-CM | POA: Diagnosis not present

## 2017-06-24 DIAGNOSIS — S61012A Laceration without foreign body of left thumb without damage to nail, initial encounter: Secondary | ICD-10-CM | POA: Diagnosis not present

## 2017-07-03 DIAGNOSIS — M67911 Unspecified disorder of synovium and tendon, right shoulder: Secondary | ICD-10-CM | POA: Diagnosis not present

## 2017-07-07 ENCOUNTER — Encounter: Payer: Self-pay | Admitting: *Deleted

## 2017-07-08 ENCOUNTER — Ambulatory Visit: Payer: BLUE CROSS/BLUE SHIELD | Admitting: Anesthesiology

## 2017-07-08 ENCOUNTER — Ambulatory Visit
Admission: RE | Admit: 2017-07-08 | Discharge: 2017-07-08 | Disposition: A | Discharge status: Home / Self Care | Payer: BLUE CROSS/BLUE SHIELD | Source: Ambulatory Visit | Attending: Unknown Physician Specialty | Admitting: Unknown Physician Specialty

## 2017-07-08 ENCOUNTER — Encounter
Admission: RE | Disposition: A | Discharge status: Home / Self Care | Payer: Self-pay | Source: Ambulatory Visit | Attending: Unknown Physician Specialty

## 2017-07-08 DIAGNOSIS — M1990 Unspecified osteoarthritis, unspecified site: Secondary | ICD-10-CM | POA: Insufficient documentation

## 2017-07-08 DIAGNOSIS — F419 Anxiety disorder, unspecified: Secondary | ICD-10-CM | POA: Insufficient documentation

## 2017-07-08 DIAGNOSIS — Z79899 Other long term (current) drug therapy: Secondary | ICD-10-CM | POA: Diagnosis not present

## 2017-07-08 DIAGNOSIS — I2510 Atherosclerotic heart disease of native coronary artery without angina pectoris: Secondary | ICD-10-CM | POA: Diagnosis not present

## 2017-07-08 DIAGNOSIS — E7800 Pure hypercholesterolemia, unspecified: Secondary | ICD-10-CM | POA: Diagnosis not present

## 2017-07-08 DIAGNOSIS — Z1211 Encounter for screening for malignant neoplasm of colon: Secondary | ICD-10-CM | POA: Diagnosis not present

## 2017-07-08 DIAGNOSIS — F40240 Claustrophobia: Secondary | ICD-10-CM | POA: Diagnosis not present

## 2017-07-08 DIAGNOSIS — K648 Other hemorrhoids: Secondary | ICD-10-CM | POA: Diagnosis not present

## 2017-07-08 DIAGNOSIS — D128 Benign neoplasm of rectum: Secondary | ICD-10-CM | POA: Diagnosis not present

## 2017-07-08 DIAGNOSIS — I10 Essential (primary) hypertension: Secondary | ICD-10-CM | POA: Diagnosis not present

## 2017-07-08 DIAGNOSIS — K219 Gastro-esophageal reflux disease without esophagitis: Secondary | ICD-10-CM | POA: Diagnosis not present

## 2017-07-08 DIAGNOSIS — D120 Benign neoplasm of cecum: Secondary | ICD-10-CM | POA: Insufficient documentation

## 2017-07-08 DIAGNOSIS — J449 Chronic obstructive pulmonary disease, unspecified: Secondary | ICD-10-CM | POA: Diagnosis not present

## 2017-07-08 DIAGNOSIS — Z87891 Personal history of nicotine dependence: Secondary | ICD-10-CM | POA: Diagnosis not present

## 2017-07-08 DIAGNOSIS — Z887 Allergy status to serum and vaccine status: Secondary | ICD-10-CM | POA: Diagnosis not present

## 2017-07-08 DIAGNOSIS — K621 Rectal polyp: Secondary | ICD-10-CM | POA: Diagnosis not present

## 2017-07-08 DIAGNOSIS — R7303 Prediabetes: Secondary | ICD-10-CM | POA: Diagnosis not present

## 2017-07-08 DIAGNOSIS — D122 Benign neoplasm of ascending colon: Secondary | ICD-10-CM | POA: Insufficient documentation

## 2017-07-08 DIAGNOSIS — K5790 Diverticulosis of intestine, part unspecified, without perforation or abscess without bleeding: Secondary | ICD-10-CM | POA: Insufficient documentation

## 2017-07-08 DIAGNOSIS — Z86010 Personal history of colonic polyps: Secondary | ICD-10-CM | POA: Insufficient documentation

## 2017-07-08 DIAGNOSIS — K635 Polyp of colon: Secondary | ICD-10-CM | POA: Diagnosis not present

## 2017-07-08 HISTORY — DX: Gastro-esophageal reflux disease without esophagitis: K21.9

## 2017-07-08 HISTORY — DX: Prediabetes: R73.03

## 2017-07-08 HISTORY — DX: Unspecified asthma, uncomplicated: J45.909

## 2017-07-08 HISTORY — PX: COLONOSCOPY WITH PROPOFOL: SHX5780

## 2017-07-08 SURGERY — COLONOSCOPY WITH PROPOFOL
Anesthesia: General

## 2017-07-08 MED ORDER — PROPOFOL 500 MG/50ML IV EMUL
INTRAVENOUS | Status: AC
  Filled 2017-07-08: qty 50

## 2017-07-08 MED ORDER — LIDOCAINE 2% (20 MG/ML) 5 ML SYRINGE
INTRAMUSCULAR | Status: DC | PRN
  Administered 2017-07-08: 40 mg via INTRAVENOUS

## 2017-07-08 MED ORDER — FENTANYL CITRATE (PF) 100 MCG/2ML IJ SOLN
INTRAMUSCULAR | Status: DC | PRN
  Administered 2017-07-08 (×2): 25 ug via INTRAVENOUS
  Administered 2017-07-08: 50 ug via INTRAVENOUS

## 2017-07-08 MED ORDER — FENTANYL CITRATE (PF) 100 MCG/2ML IJ SOLN
INTRAMUSCULAR | Status: AC
  Filled 2017-07-08: qty 2

## 2017-07-08 MED ORDER — LIDOCAINE HCL (PF) 2 % IJ SOLN
INTRAMUSCULAR | Status: AC
  Filled 2017-07-08: qty 10

## 2017-07-08 MED ORDER — PHENYLEPHRINE HCL 10 MG/ML IJ SOLN
INTRAMUSCULAR | Status: DC | PRN
  Administered 2017-07-08: 100 ug via INTRAVENOUS

## 2017-07-08 MED ORDER — SODIUM CHLORIDE 0.9 % IV SOLN: INTRAVENOUS | Status: DC

## 2017-07-08 MED ORDER — PROPOFOL 10 MG/ML IV BOLUS
INTRAVENOUS | Status: AC
  Filled 2017-07-08: qty 20

## 2017-07-08 MED ORDER — SODIUM CHLORIDE 0.9 % IV SOLN
INTRAVENOUS | Status: DC
  Administered 2017-07-08: 10:00:00 via INTRAVENOUS

## 2017-07-08 MED ORDER — PROPOFOL 500 MG/50ML IV EMUL
INTRAVENOUS | Status: DC | PRN
  Administered 2017-07-08: 150 ug/kg/min via INTRAVENOUS

## 2017-07-08 MED ORDER — PROPOFOL 10 MG/ML IV BOLUS
INTRAVENOUS | Status: DC | PRN
  Administered 2017-07-08: 100 mg via INTRAVENOUS

## 2017-07-08 NOTE — Anesthesia Preprocedure Evaluation (Signed)
Anesthesia Evaluation  Patient identified by MRN, date of birth, ID band Patient awake    Reviewed: Allergy & Precautions, NPO status , Patient's Chart, lab work & pertinent test results  History of Anesthesia Complications Negative for: history of anesthetic complications  Airway Mallampati: II       Dental   Pulmonary neg sleep apnea, COPD,  COPD inhaler, former smoker,           Cardiovascular hypertension, Pt. on medications and Pt. on home beta blockers + CAD  (-) Past MI and (-) CHF (-) dysrhythmias (-) Valvular Problems/Murmurs     Neuro/Psych neg Seizures Anxiety    GI/Hepatic Neg liver ROS, GERD  Medicated and Controlled,  Endo/Other  diabetes (borderline), Type 2  Renal/GU negative Renal ROS     Musculoskeletal   Abdominal   Peds  Hematology   Anesthesia Other Findings   Reproductive/Obstetrics                             Anesthesia Physical Anesthesia Plan  ASA: II  Anesthesia Plan: General   Post-op Pain Management:    Induction: Intravenous  PONV Risk Score and Plan: Propofol infusion  Airway Management Planned: Nasal Cannula  Additional Equipment:   Intra-op Plan:   Post-operative Plan:   Informed Consent: I have reviewed the patients History and Physical, chart, labs and discussed the procedure including the risks, benefits and alternatives for the proposed anesthesia with the patient or authorized representative who has indicated his/her understanding and acceptance.     Plan Discussed with:   Anesthesia Plan Comments:         Anesthesia Quick Evaluation

## 2017-07-08 NOTE — Anesthesia Post-op Follow-up Note (Signed)
Anesthesia QCDR form completed.        

## 2017-07-08 NOTE — Transfer of Care (Signed)
Immediate Anesthesia Transfer of Care Note  Patient: Monique Sanchez  Procedure(s) Performed: COLONOSCOPY WITH PROPOFOL (N/A )  Patient Location: PACU and Endoscopy Unit  Anesthesia Type:General  Level of Consciousness: awake and patient cooperative  Airway & Oxygen Therapy: Patient Spontanous Breathing and Patient connected to nasal cannula oxygen  Post-op Assessment: Report given to RN and Post -op Vital signs reviewed and stable  Post vital signs: Reviewed and stable  Last Vitals:  Vitals:   07/08/17 0926 07/08/17 0948  BP: (!) 146/68   Pulse: (!) 113   Resp: 20   Temp:  36.8 C  SpO2: 100%     Last Pain:  Vitals:   07/08/17 0948  TempSrc: Tympanic         Complications: No apparent anesthesia complications

## 2017-07-08 NOTE — Anesthesia Postprocedure Evaluation (Signed)
Anesthesia Post Note  Patient: Monique Sanchez  Procedure(s) Performed: COLONOSCOPY WITH PROPOFOL (N/A )  Patient location during evaluation: Endoscopy Anesthesia Type: General Level of consciousness: awake and alert Pain management: pain level controlled Vital Signs Assessment: post-procedure vital signs reviewed and stable Respiratory status: spontaneous breathing and respiratory function stable Cardiovascular status: stable Anesthetic complications: no     Last Vitals:  Vitals:   07/08/17 1028 07/08/17 1029  BP: (!) 80/46 105/63  Pulse: 83   Resp: (!) 21   Temp: (!) 36.2 C   SpO2: 100%     Last Pain:  Vitals:   07/08/17 1027  TempSrc: Tympanic                 Demani Mcbrien K

## 2017-07-08 NOTE — H&P (Signed)
Primary Care Physician:  Lucille Passy, MD Primary Gastroenterologist:  Dr. Vira Agar  Pre-Procedure History & Physical: HPI:  Monique Sanchez is a 66 y.o. female is here for an colonoscopy.   Past Medical History:  Diagnosis Date  . Arthritis   . Asthma   . Blockage of coronary artery of heart (HCC)    50% blockage single vessels  . Claustrophobia   . COPD (chronic obstructive pulmonary disease) ([place])   . Diverticular disease of colon 2010  . GERD (gastroesophageal reflux disease)   . Hypercholesteremia   . Hypertension   . Osteoarthritis   . Pre-diabetes   . Seasonal allergies     Past Surgical History:  Procedure Laterality Date  . ABLATION ON ENDOMETRIOSIS  1976  . ANTERIOR CERVICAL DECOMP/DISCECTOMY FUSION  04/20/2014   Procedure: ANTERIOR CERVICAL DECOMPRESSION/DISCECTOMY FUSION 2 LEVELS;  Surgeon: Sinclair Ship, MD;  Location: [facility];  Service: Orthopedics;;  Anterior cervical decompression fusion, cervical 4-5, cervical 5-6 with instrumentation and allograft, possible C4 corpectomy.  Marland Kitchen CARDIAC CATHETERIZATION  2010   [facility]; Dr. Josefa Half  . CATARACT EXTRACTION W/ INTRAOCULAR LENS IMPLANT Left 05/2014  . 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  . CHOLECYSTECTOMY  2011  . COLON RESECTION     for diverticulitis  . COLON SURGERY Left 2010  . FOOT SURGERY  1995    Prior to Admission medications   Medication Sig Start Date End Date Taking? Authorizing Provider  acetaminophen (TYLENOL) 500 MG tablet Take 500 mg by mouth daily as needed.   Yes [provider]  budesonide-formoterol (SYMBICORT) 160-4.5 MCG/ACT inhaler Inhale 2 puffs into the lungs 2 (two) times daily. 03/18/17  Yes Juanito Doom, MD  hydrochlorothiazide (HYDRODIURIL) 25 MG tablet TAKE 1 TABLET DAILY 02/09/17  Yes Lucille Passy, MD  lansoprazole (PREVACID) 30 MG capsule Take 1 capsule  (30 mg total) by mouth daily at 12 noon. 05/02/16  Yes Lucille Passy, MD  metoprolol succinate (TOPROL-XL) 25 MG 24 hr tablet TAKE 1 TABLET DAILY 04/21/17  Yes Lucille Passy, MD  omeprazole (PRILOSEC) 40 MG capsule TAKE 1 CAPSULE DAILY 02/09/17  Yes Lucille Passy, MD  budesonide-formoterol [facility]) 160-4.5 MCG/ACT inhaler Inhale 2 puffs into the lungs 2 (two) times daily. 03/18/17 03/25/17  Juanito Doom, MD  cetirizine (ZYRTEC) 10 MG tablet Take 10 mg by mouth at bedtime.    [provider]  Fluticasone-Umeclidin-Vilant (TRELEGY ELLIPTA) 100-62.5-25 MCG/INH AEPB Inhale 1 puff into the lungs daily. Patient not taking: Reported on 07/08/2017 03/03/17   Simonne Maffucci B, MD  glucose blood test strip Check blood sugar once daily and as directed with onetouch ultra blue test strips. Dx E11.9 12/01/14   Lucille Passy, MD  methocarbamol (ROBAXIN) 500 MG tablet Take 1 tablet (500 mg total) by mouth 4 (four) times daily. Patient not taking: Reported on 07/08/2017 10/07/16   Lucille Passy, MD  [facility] DELICA LANCETS 42H MISC Check blood sugar once daily and as directed. Dx E11.9 12/01/14   Lucille Passy, MD  OVER THE COUNTER MEDICATION Phason- otc gas relief- prn    [provider]  rosuvastatin (CRESTOR) 5 MG tablet TAKE 1 TABLET DAILY 05/12/17   Lucille Passy, MD  Spacer/Aero-Holding Chambers (AEROCHAMBER MV) inhaler Use as instructed 02/03/17   Juanito Doom, MD  tiotropium (SPIRIVA) 18 MCG inhalation capsule Place 1 capsule (  18 mcg total) into inhaler and inhale daily. 01/17/16   Juanito Doom, MD    Allergies as of 04/27/2017 - Review Complete 02/03/2017  Allergen Reaction Noted  . Influenza vaccines  01/31/2014    Family History  Problem Relation Age of Onset  . Heart disease Mother   . Cancer Mother        lung metastatic site  . Heart disease Father   . Cancer Brother        lung  . Hyperlipidemia Brother   . Hypertension Brother   . Breast cancer Paternal  Grandmother     Social History   Social History  . Marital status: Single    Spouse name: N/A  . Number of children: N/A  . Years of education: N/A   Occupational History  . Not on file.   Social History Main Topics  . Smoking status: Former Smoker    Packs/day: 0.50    Years: 30.00    Types: Cigarettes    Quit date: 09/30/2007  . Smokeless tobacco: Never Used  . Alcohol use 0.0 oz/week     Comment: occasional wine,none in 24hrs  . Drug use: No  . Sexual activity: Not on file   Other Topics Concern  . Not on file   Social History Narrative  . No narrative on file    Review of Systems: See HPI, otherwise negative ROS  Physical Exam: BP (!) 146/68   Pulse (!) 113   Temp 98.3 F (36.8 C) (Tympanic)   Resp 20   Ht 5' 2.5" (1.588 m)   Wt 59.9 kg (132 lb)   SpO2 100%   BMI 23.76 kg/m  General:   Alert,  pleasant and cooperative in NAD Head:  Normocephalic and atraumatic. Neck:  Supple; no masses or thyromegaly. Lungs:  Clear throughout to auscultation.    Heart:  Regular rate and rhythm. Abdomen:  Soft, nontender and nondistended. Normal bowel sounds, without guarding, and without rebound.   Neurologic:  Alert and  oriented x4;  grossly normal neurologically.  Impression/Plan: Monique Sanchez is here for an colonoscopy to be performed for [facility] colon polyps.  Risks, benefits, limitations, and alternatives regarding  colonoscopy have been reviewed with the patient.  Questions have been answered.  All parties agreeable.   Gaylyn Cheers, MD  07/08/2017, 9:57 AM

## 2017-07-08 NOTE — Op Note (Signed)
[facility] Gastroenterology Patient Name: Monique Sanchez Procedure Date: 07/08/2017 9:52 AM MRN: [id number] Account #: [id number] Date of Birth: 07/31/1951 Admit Type: Outpatient Age: 66 Room: [facility] ENDO ROOM 3 Gender: Female Note Status: Finalized Procedure:            Colonoscopy Indications:          High risk colon cancer surveillance: Personal history                        of colonic polyps Providers:            Manya Silvas, MD Medicines:            Propofol per Anesthesia Complications:        No immediate complications. Procedure:            Pre-Anesthesia Assessment:                       - After reviewing the risks and benefits, the patient                        was deemed in satisfactory condition to undergo the                        procedure.                       After obtaining informed consent, the colonoscope was                        passed under direct vision. Throughout the procedure,                        the patient's blood pressure, pulse, and oxygen                        saturations were monitored continuously. The                        Colonoscope was introduced through the anus and                        advanced to the the cecum, identified by appendiceal                        orifice and ileocecal valve. The colonoscopy was                        performed without difficulty. The patient tolerated the                        procedure well. The quality of the bowel preparation                        was excellent. Findings:      A diminutive polyp was found in the cecum. The polyp was sessile. The       polyp was removed with a jumbo cold forceps. Resection and retrieval       were complete.      Two sessile polyps were found in the ascending colon. The polyps were       diminutive in size.  These polyps were removed with a jumbo cold forceps.       Resection and retrieval were complete.      A diminutive polyp was found  in the rectum. The polyp was sessile. The       polyp was removed with a jumbo cold forceps. Resection and retrieval       were complete.      A few small-mouthed diverticula were found in the sigmoid colon.      The exam was otherwise without abnormality.      Internal hemorrhoids were found during endoscopy. The hemorrhoids were       small and Grade I (internal hemorrhoids that do not prolapse). Impression:           - One diminutive polyp in the cecum, removed with a                        jumbo cold forceps. Resected and retrieved.                       - Two diminutive polyps in the ascending colon, removed                        with a jumbo cold forceps. Resected and retrieved.                       - One diminutive polyp in the rectum, removed with a                        jumbo cold forceps. Resected and retrieved. Recommendation:       - Await pathology results. Manya Silvas, MD 07/08/2017 10:26:03 AM This report has been signed electronically. Number of Addenda: 0 Note Initiated On: 07/08/2017 9:52 AM Scope Withdrawal Time: 0 hours 9 minutes 50 seconds  Total Procedure Duration: 0 hours 16 minutes 5 seconds       [facility]

## 2017-07-09 ENCOUNTER — Encounter: Payer: Self-pay | Admitting: Unknown Physician Specialty

## 2017-07-09 LAB — SURGICAL PATHOLOGY

## 2017-07-29 ENCOUNTER — Other Ambulatory Visit: Payer: Self-pay | Admitting: Family Medicine

## 2017-08-18 ENCOUNTER — Ambulatory Visit: Payer: BLUE CROSS/BLUE SHIELD | Admitting: Family Medicine

## 2017-08-31 ENCOUNTER — Ambulatory Visit: Payer: BLUE CROSS/BLUE SHIELD | Admitting: Family Medicine

## 2017-09-01 ENCOUNTER — Ambulatory Visit: Payer: BLUE CROSS/BLUE SHIELD | Admitting: Family Medicine

## 2017-09-01 ENCOUNTER — Encounter: Payer: Self-pay | Admitting: Family Medicine

## 2017-09-01 DIAGNOSIS — E119 Type 2 diabetes mellitus without complications: Secondary | ICD-10-CM | POA: Diagnosis not present

## 2017-09-01 LAB — MICROALBUMIN / CREATININE URINE RATIO
CREATININE, U: 47.5 mg/dL
MICROALB/CREAT RATIO: 1.5 mg/g (ref 0.0–30.0)

## 2017-09-01 LAB — HEMOGLOBIN A1C: Hgb A1c MFr Bld: 6.4 % (ref 4.6–6.5)

## 2017-09-01 MED ORDER — TIOTROPIUM BROMIDE MONOHYDRATE 18 MCG IN CAPS: 18.0000 ug | ORAL_CAPSULE | Freq: Every day | RESPIRATORY_TRACT | 3 refills | Status: DC

## 2017-09-01 NOTE — Assessment & Plan Note (Signed)
No rx added at this time. Check a1c today. The patient indicates understanding of these issues and agrees with the plan.

## 2017-09-01 NOTE — Patient Instructions (Signed)
Great to see you. Happy holidays!  We will call you with your results from today or you can view them online.

## 2017-09-01 NOTE — Progress Notes (Signed)
Subjective:   Patient ID: Monique Sanchez, female    DOB: September 02, 1951, 66 y.o.   MRN: [id number]  Monique Sanchez is a pleasant 66 y.o. year old female who presents to clinic today with Diabetes (Patient is here today to F/U with DM2.  She is currently fasting.  She states that she knows she is due for a Mammogram.  When asked about a BMD Scan she states it was 5-years-ago at [facility] and [facility].)  on 09/01/2017  HPI:  DM- diagnosed in 10/2014.   At that  time, started her on Metformin 500 mg daily and referred to diabetic teaching which she did attend.  Stopped metformin 3 months.  Checking FSBS twice daily. Brings log with her today. Ranging around 120s. Denies any episodes of hypoglycemia.  Lab Results  Component Value Date   HGBA1C 6.6 (H) 02/09/2017     Has declined pneumococcal vaccination.  On statin.  Lab Results  Component Value Date   HGBA1C 6.6 (H) 02/09/2017   Lab Results  Component Value Date   CHOL 166 02/09/2017   HDL 65.40 02/09/2017   LDLCALC 83 02/09/2017   TRIG 89.0 02/09/2017   CHOLHDL 3 02/09/2017   Lab Results  Component Value Date   CREATININE 0.76 02/09/2017    Current Outpatient Medications on File Prior to Visit  Medication Sig Dispense Refill  . acetaminophen (TYLENOL) 500 MG tablet Take 500 mg by mouth daily as needed.    . budesonide-formoterol (SYMBICORT) 160-4.5 MCG/ACT inhaler Inhale 2 puffs into the lungs 2 (two) times daily. 3 Inhaler 3  . cetirizine (ZYRTEC) 10 MG tablet Take 10 mg by mouth at bedtime.    Marland Kitchen glucose blood test strip Check blood sugar once daily and as directed with onetouch ultra blue test strips. Dx E11.9 100 each 0  . hydrochlorothiazide (HYDRODIURIL) 25 MG tablet TAKE 1 TABLET DAILY 90 tablet 0  . methocarbamol (ROBAXIN) 500 MG tablet Take 1 tablet (500 mg total) by mouth 4 (four) times daily. 30 tablet 0  . metoprolol succinate (TOPROL-XL) 25 MG 24 hr tablet TAKE 1 TABLET DAILY 90 tablet 3  .  omeprazole (PRILOSEC) 40 MG capsule TAKE 1 CAPSULE DAILY 90 capsule 0  . ONETOUCH DELICA LANCETS 19J MISC Check blood sugar once daily and as directed. Dx E11.9 100 each 0  . OVER THE COUNTER MEDICATION Phason- otc gas relief- prn    . rosuvastatin (CRESTOR) 5 MG tablet TAKE 1 TABLET DAILY 90 tablet 0  . Spacer/Aero-Holding Chambers (AEROCHAMBER MV) inhaler Use as instructed 1 each 0  . tiotropium (SPIRIVA) 18 MCG inhalation capsule Place 1 capsule (18 mcg total) into inhaler and inhale daily. 90 capsule 3   No current facility-administered medications on file prior to visit.     Allergies  Allergen Reactions  . Influenza Vaccines     Muscle cramps, shaking    Past Medical History:  Diagnosis Date  . Arthritis   . Asthma   . Blockage of coronary artery of heart (HCC)    50% blockage single vessels  . Claustrophobia   . COPD (chronic obstructive pulmonary disease) ([place])   . Diverticular disease of colon 2010  . GERD (gastroesophageal reflux disease)   . Hypercholesteremia   . Hypertension   . Osteoarthritis   . Pre-diabetes   . Seasonal allergies     Past Surgical History:  Procedure Laterality Date  . ABLATION ON ENDOMETRIOSIS  1976  . ANTERIOR CERVICAL DECOMP/DISCECTOMY FUSION  04/20/2014  Procedure: ANTERIOR CERVICAL DECOMPRESSION/DISCECTOMY FUSION 2 LEVELS;  Surgeon: Sinclair Ship, MD;  Location: [facility];  Service: Orthopedics;;  Anterior cervical decompression fusion, cervical 4-5, cervical 5-6 with instrumentation and allograft, possible C4 corpectomy.  Marland Kitchen CARDIAC CATHETERIZATION  2010   [facility]; Dr. Josefa Half  . CATARACT EXTRACTION W/ INTRAOCULAR LENS IMPLANT Left 05/2014  . 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  . CHOLECYSTECTOMY  2011  . COLON RESECTION     for diverticulitis  . COLON SURGERY Left 2010  . COLONOSCOPY WITH PROPOFOL N/A 07/08/2017    Procedure: COLONOSCOPY WITH PROPOFOL;  Surgeon: Manya Silvas, MD;  Location: [facility] ENDOSCOPY;  Service: Endoscopy;  Laterality: N/A;  . FOOT SURGERY  1995    Family History  Problem Relation Age of Onset  . Heart disease Mother   . Cancer Mother        lung metastatic site  . Heart disease Father   . Cancer Brother        lung  . Hyperlipidemia Brother   . Hypertension Brother   . Breast cancer Paternal Grandmother     Social History   Socioeconomic History  . Marital status: Single    Spouse name: Not on file  . Number of children: Not on file  . Years of education: Not on file  . Highest education level: Not on file  Social Needs  . Financial resource strain: Not on file  . Food insecurity - worry: Not on file  . Food insecurity - inability: Not on file  . Transportation needs - medical: Not on file  . Transportation needs - non-medical: Not on file  Occupational History  . Not on file  Tobacco Use  . Smoking status: Former Smoker    Packs/day: 0.50    Years: 30.00    Pack years: 15.00    Types: Cigarettes    Last attempt to quit: 09/30/2007    Years since quitting: 9.9  . Smokeless tobacco: Never Used  Substance and Sexual Activity  . Alcohol use: Yes    Alcohol/week: 0.0 oz    Comment: occasional wine,none in 24hrs  . Drug use: No  . Sexual activity: Not on file  Other Topics Concern  . Not on file  Social History Narrative  . Not on file   The PMH, PSH, Social History, Family History, Medications, and allergies have been reviewed in [facility], and have been updated if relevant.   Review of Systems  Constitutional: Negative.   HENT: Negative.   Eyes: Negative.   Respiratory: Negative.   Cardiovascular: Negative.   Gastrointestinal: Negative.   Endocrine: Negative.   Genitourinary: Negative.   Musculoskeletal: Negative.   Allergic/Immunologic: Negative.   Neurological: Negative.   Hematological: Negative.   Psychiatric/Behavioral: Negative.   All  other systems reviewed and are negative.      Objective:    There were no vitals taken for this visit.   Physical Exam  Constitutional: She is oriented to person, place, and time. She appears well-developed and well-nourished. No distress.  HENT:  Head: Normocephalic and atraumatic.  Eyes: Conjunctivae are normal.  Cardiovascular: Normal rate and regular rhythm.  Pulmonary/Chest: Effort normal and breath sounds normal.  Musculoskeletal: Normal range of motion.  Neurological: She is alert and oriented to person, place, and time.  Skin: Skin is warm and dry. She is not diaphoretic.  Psychiatric: She has a normal mood  and affect. Her behavior is normal. Judgment and thought content normal.  Nursing note and vitals reviewed.          Assessment & Plan:   Diet-controlled diabetes mellitus ([place]) - Plan: Hemoglobin A1c No Follow-up on file.

## 2017-09-23 ENCOUNTER — Other Ambulatory Visit: Payer: Self-pay | Admitting: Family Medicine

## 2017-10-18 DIAGNOSIS — J441 Chronic obstructive pulmonary disease with (acute) exacerbation: Secondary | ICD-10-CM | POA: Diagnosis not present

## 2017-10-24 DIAGNOSIS — R0982 Postnasal drip: Secondary | ICD-10-CM | POA: Diagnosis not present

## 2017-11-12 ENCOUNTER — Other Ambulatory Visit: Payer: Self-pay | Admitting: Family Medicine

## 2017-11-29 DIAGNOSIS — B9689 Other specified bacterial agents as the cause of diseases classified elsewhere: Secondary | ICD-10-CM | POA: Diagnosis not present

## 2017-11-29 DIAGNOSIS — J019 Acute sinusitis, unspecified: Secondary | ICD-10-CM | POA: Diagnosis not present

## 2018-02-01 ENCOUNTER — Telehealth: Payer: Self-pay | Admitting: Family Medicine

## 2018-02-03 ENCOUNTER — Telehealth: Payer: Self-pay | Admitting: Family Medicine

## 2018-02-03 NOTE — Telephone Encounter (Unsigned)
Copied from [place]. Topic: Quick Communication - Rx Refill/Question >> Feb 03, 2018  2:08 PM Neva Seat wrote: hydrochlorothiazide (HYDRODIURIL) 25 MG tablet omeprazole (PRILOSEC) 40 MG capsule  Please resend pt's refills asap!  Pt's insurance has changed to: [place], [place] [address] [place] [address] Phone: [phone] Fax: [phone]

## 2018-02-11 ENCOUNTER — Other Ambulatory Visit: Payer: Self-pay | Admitting: *Deleted

## 2018-02-11 MED ORDER — HYDROCHLOROTHIAZIDE 25 MG PO TABS: 25.0000 mg | ORAL_TABLET | Freq: Every day | ORAL | 0 refills | Status: DC

## 2018-02-11 MED ORDER — OMEPRAZOLE 40 MG PO CPDR: 40.0000 mg | DELAYED_RELEASE_CAPSULE | Freq: Every day | ORAL | 0 refills | Status: DC

## 2018-02-11 NOTE — Telephone Encounter (Signed)
Leda Gauze with [employer] states pt medications was sent to the incorrect pharmacy.  Meds needs to be sent this time to:  [id number] [place], [place] [phone] (Phone) [phone] (Fax)

## 2018-02-11 NOTE — Telephone Encounter (Signed)
Attempted to call patient to schedule an appointment with Dr Deborra Medina. No answer, left message for pt to call the office and schedule an appointment for June as requested.

## 2018-02-12 NOTE — Telephone Encounter (Signed)
Rx's were sent to correct pharmacy yesterday.

## 2018-03-04 ENCOUNTER — Encounter: Payer: BLUE CROSS/BLUE SHIELD | Admitting: Family Medicine

## 2018-03-08 ENCOUNTER — Encounter: Payer: BLUE CROSS/BLUE SHIELD | Admitting: Family Medicine

## 2018-03-11 ENCOUNTER — Ambulatory Visit (INDEPENDENT_AMBULATORY_CARE_PROVIDER_SITE_OTHER): Payer: BLUE CROSS/BLUE SHIELD | Admitting: Family Medicine

## 2018-03-11 ENCOUNTER — Other Ambulatory Visit: Payer: Self-pay

## 2018-03-11 ENCOUNTER — Encounter: Payer: Self-pay | Admitting: Family Medicine

## 2018-03-11 VITALS — BP 132/72 | HR 71 | Temp 98.7°F | Ht 62.25 in | Wt 137.4 lb

## 2018-03-11 DIAGNOSIS — E785 Hyperlipidemia, unspecified: Secondary | ICD-10-CM

## 2018-03-11 DIAGNOSIS — Z1231 Encounter for screening mammogram for malignant neoplasm of breast: Secondary | ICD-10-CM | POA: Diagnosis not present

## 2018-03-11 DIAGNOSIS — E119 Type 2 diabetes mellitus without complications: Secondary | ICD-10-CM

## 2018-03-11 DIAGNOSIS — Z0000 Encounter for general adult medical examination without abnormal findings: Secondary | ICD-10-CM | POA: Insufficient documentation

## 2018-03-11 DIAGNOSIS — E2839 Other primary ovarian failure: Secondary | ICD-10-CM | POA: Diagnosis not present

## 2018-03-11 DIAGNOSIS — Z1239 Encounter for other screening for malignant neoplasm of breast: Secondary | ICD-10-CM

## 2018-03-11 DIAGNOSIS — I10 Essential (primary) hypertension: Secondary | ICD-10-CM

## 2018-03-11 DIAGNOSIS — J449 Chronic obstructive pulmonary disease, unspecified: Secondary | ICD-10-CM

## 2018-03-11 LAB — COMPREHENSIVE METABOLIC PANEL
ALBUMIN: 4.3 g/dL (ref 3.5–5.2)
ALK PHOS: 71 U/L (ref 39–117)
ALT: 23 U/L (ref 0–35)
AST: 27 U/L (ref 0–37)
BILIRUBIN TOTAL: 0.7 mg/dL (ref 0.2–1.2)
BUN: 15 mg/dL (ref 6–23)
CO2: 30 mEq/L (ref 19–32)
CREATININE: 0.84 mg/dL (ref 0.40–1.20)
Calcium: 9.2 mg/dL (ref 8.4–10.5)
Chloride: 97 mEq/L (ref 96–112)
GFR: 71.6 mL/min (ref >60.00)
GLUCOSE: 118 mg/dL — AB (ref 70–99)
Potassium: 3.7 mEq/L (ref 3.5–5.1)
SODIUM: 136 meq/L (ref 135–145)
TOTAL PROTEIN: 6.9 g/dL (ref 6.0–8.3)

## 2018-03-11 LAB — MICROALBUMIN / CREATININE URINE RATIO
CREATININE, U: 30 mg/dL
Microalb Creat Ratio: 2.3 mg/g (ref 0.0–30.0)
Microalb, Ur: <0.7 mg/dL (ref 0.0–1.9)

## 2018-03-11 LAB — LIPID PANEL
Cholesterol: 177 mg/dL (ref 0–200)
HDL: 65.8 mg/dL (ref >39.00)
LDL Cholesterol: 89 mg/dL (ref 0–99)
NONHDL: 110.96
Total CHOL/HDL Ratio: 3
Triglycerides: 112 mg/dL (ref 0.0–149.0)
VLDL: 22.4 mg/dL (ref 0.0–40.0)

## 2018-03-11 LAB — HEMOGLOBIN A1C: Hgb A1c MFr Bld: 6.6 % — ABNORMAL HIGH (ref 4.6–6.5)

## 2018-03-11 LAB — TSH: TSH: 1.65 u[IU]/mL (ref 0.35–4.50)

## 2018-03-11 MED ORDER — TRIAMCINOLONE ACETONIDE 0.025 % EX CREA: 1.0000 "application " | TOPICAL_CREAM | Freq: Two times a day (BID) | CUTANEOUS | 2 refills | Status: DC

## 2018-03-11 MED ORDER — ROSUVASTATIN CALCIUM 5 MG PO TABS: 5.0000 mg | ORAL_TABLET | Freq: Every day | ORAL | 3 refills | Status: DC

## 2018-03-11 NOTE — Assessment & Plan Note (Signed)
Reviewed preventive care protocols, scheduled due services, and updated immunizations Discussed nutrition, exercise, diet, and healthy lifestyle.  Orders Placed This Encounter  Procedures  . MM Digital Screening    Standing Status:   Future    Standing Expiration Date:   05/12/2019    Order Specific Question:   Reason for Exam (SYMPTOM  OR DIAGNOSIS REQUIRED)    Answer:   screening breast cancer    Order Specific Question:   Preferred imaging location?    Answer:   [facility]  . DG Bone Density    Standing Status:   Future    Standing Expiration Date:   05/12/2019    Order Specific Question:   Reason for Exam (SYMPTOM  OR DIAGNOSIS REQUIRED)    Answer:   estrogen deficiency    Order Specific Question:   Preferred imaging location?    Answer:   [facility]  . Hemoglobin A1c  . Comprehensive metabolic panel  . Lipid panel  . TSH

## 2018-03-11 NOTE — Assessment & Plan Note (Signed)
Diet controlled- repeat a1c today, urine micro.

## 2018-03-11 NOTE — Patient Instructions (Addendum)
Great to see you. I will call you with your lab results from today and you can view them online.   Please call [facility] to schedule your mammogram and bone density.

## 2018-03-11 NOTE — Progress Notes (Signed)
Subjective:   Patient ID: Monique Sanchez, female    DOB: 1951/09/08, 67 y.o.   MRN: [id number]  Monique Sanchez is a pleasant 67 y.o. year old female who presents to clinic today with Annual Exam (Patient is here today for CPE without PAP.  She is currently fasting.  She agrees for Mammogram and BMD scan at [facility] and will call them to schedule.)  on 03/11/2018  HPI:  Patient is here today for CPE without PAP. She is currently fasting. She agrees for Mammogram and BMD scan at [facility] and will call them to schedule.  Health Maintenance  Topic Date Due  . Hepatitis C Screening  [birth year]  . DEXA SCAN  11/16/2014  . MAMMOGRAM  07/19/2017  . FOOT EXAM  02/12/2018  . HEMOGLOBIN A1C  03/02/2018  . PNA vac Low Risk Adult (1 of 2 - PCV13) 10/31/2018 (Originally 11/16/2014)  . OPHTHALMOLOGY EXAM  05/11/2018  . URINE MICROALBUMIN  09/01/2018  . TETANUS/TDAP  03/01/2024  . COLONOSCOPY  07/09/2027    DM- diagnosed in 10/2014.   At that  time, started her on Metformin 500 mg daily and referred to diabetic teaching which she did attend.  Has since controlling with diet alone.  Has declined pneumococcal vaccination.   Her daughter was recently diagnosed with Type 1 DM at the age of 40.  On statin.  HTN- well controlled on HCTZ and toprol XL. Lab Results  Component Value Date   CREATININE 0.76 02/09/2017    Lab Results  Component Value Date   HGBA1C 6.4 09/01/2017   Lab Results  Component Value Date   CHOL 166 02/09/2017   HDL 65.40 02/09/2017   LDLCALC 83 02/09/2017   TRIG 89.0 02/09/2017   CHOLHDL 3 02/09/2017    Current Outpatient Medications on File Prior to Visit  Medication Sig Dispense Refill  . acetaminophen (TYLENOL) 500 MG tablet Take 500 mg by mouth daily as needed.    . budesonide-formoterol (SYMBICORT) 160-4.5 MCG/ACT inhaler Inhale 2 puffs into the lungs 2 (two) times daily. 3 Inhaler 3  . cetirizine (ZYRTEC) 10 MG tablet Take 10 mg by mouth at  bedtime.    Marland Kitchen glucose blood test strip Check blood sugar once daily and as directed with onetouch ultra blue test strips. Dx E11.9 100 each 0  . hydrochlorothiazide (HYDRODIURIL) 25 MG tablet Take 1 tablet (25 mg total) by mouth daily. 90 tablet 0  . metoprolol succinate (TOPROL-XL) 25 MG 24 hr tablet TAKE 1 TABLET DAILY 90 tablet 3  . omeprazole (PRILOSEC) 40 MG capsule Take 1 capsule (40 mg total) by mouth daily. 90 capsule 0  . ONETOUCH DELICA LANCETS 76P MISC Check blood sugar once daily and as directed. Dx E11.9 100 each 0  . OVER THE COUNTER MEDICATION Phason- otc gas relief- prn    . Spacer/Aero-Holding Chambers (AEROCHAMBER MV) inhaler Use as instructed 1 each 0  . tiotropium (SPIRIVA) 18 MCG inhalation capsule Place 1 capsule (18 mcg total) into inhaler and inhale daily. 90 capsule 3   No current facility-administered medications on file prior to visit.     Allergies  Allergen Reactions  . Influenza Vaccines     Muscle cramps, shaking    Past Medical History:  Diagnosis Date  . Arthritis   . Asthma   . Blockage of coronary artery of heart (HCC)    50% blockage single vessels  . Claustrophobia   . COPD (chronic obstructive pulmonary disease) ([place])   .  Diverticular disease of colon 2010  . GERD (gastroesophageal reflux disease)   . Hypercholesteremia   . Hypertension   . Osteoarthritis   . Pre-diabetes   . Seasonal allergies     Past Surgical History:  Procedure Laterality Date  . ABLATION ON ENDOMETRIOSIS  1976  . ANTERIOR CERVICAL DECOMP/DISCECTOMY FUSION  04/20/2014   Procedure: ANTERIOR CERVICAL DECOMPRESSION/DISCECTOMY FUSION 2 LEVELS;  Surgeon: Sinclair Ship, MD;  Location: [facility];  Service: Orthopedics;;  Anterior cervical decompression fusion, cervical 4-5, cervical 5-6 with instrumentation and allograft, possible C4 corpectomy.  Marland Kitchen CARDIAC CATHETERIZATION  2010   [facility]; Dr. Josefa Half  . CATARACT EXTRACTION W/ INTRAOCULAR LENS IMPLANT Left  05/2014  . 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  . CHOLECYSTECTOMY  2011  . COLON RESECTION     for diverticulitis  . COLON SURGERY Left 2010  . COLONOSCOPY WITH PROPOFOL N/A 07/08/2017   Procedure: COLONOSCOPY WITH PROPOFOL;  Surgeon: Manya Silvas, MD;  Location: [facility] ENDOSCOPY;  Service: Endoscopy;  Laterality: N/A;  . FOOT SURGERY  1995    Family History  Problem Relation Age of Onset  . Heart disease Mother   . Cancer Mother        lung metastatic site  . Heart disease Father   . Cancer Brother        lung  . Hyperlipidemia Brother   . Hypertension Brother   . Breast cancer Paternal Grandmother     Social History   Socioeconomic History  . Marital status: Single    Spouse name: Not on file  . Number of children: Not on file  . Years of education: Not on file  . Highest education level: Not on file  Occupational History  . Not on file  Social Needs  . Financial resource strain: Not on file  . Food insecurity:    Worry: Not on file    Inability: Not on file  . Transportation needs:    Medical: Not on file    Non-medical: Not on file  Tobacco Use  . Smoking status: Former Smoker    Packs/day: 0.50    Years: 30.00    Pack years: 15.00    Types: Cigarettes    Last attempt to quit: 09/30/2007    Years since quitting: 10.4  . Smokeless tobacco: Never Used  Substance and Sexual Activity  . Alcohol use: Yes    Alcohol/week: 0.0 oz    Comment: occasional wine,none in 24hrs  . Drug use: No  . Sexual activity: Not on file  Lifestyle  . Physical activity:    Days per week: Not on file    Minutes per session: Not on file  . Stress: Not on file  Relationships  . Social connections:    Talks on phone: Not on file    Gets together: Not on file    Attends religious service: Not on file    Active member of club or organization: Not on file    Attends meetings of  clubs or organizations: Not on file    Relationship status: Not on file  . Intimate partner violence:    Fear of current or ex partner: Not on file    Emotionally abused: Not on file    Physically abused: Not on file    Forced sexual activity: Not on file  Other Topics Concern  . Not on file  Social  History Narrative  . Not on file   The PMH, PSH, Social History, Family History, Medications, and allergies have been reviewed in [facility], and have been updated if relevant.   Review of Systems  Constitutional: Negative.   HENT: Negative.   Eyes: Negative.   Respiratory: Negative.   Cardiovascular: Negative.   Gastrointestinal: Negative.   Endocrine: Negative.   Genitourinary: Negative.   Musculoskeletal: Negative.   Allergic/Immunologic: Negative.   Neurological: Negative.   Hematological: Negative.   Psychiatric/Behavioral: Negative.   All other systems reviewed and are negative.      Objective:    BP 132/72 (BP Location: Left Arm, Patient Position: Sitting, Cuff Size: Normal)   Pulse 71   Temp 98.7 F (37.1 C) (Oral)   Ht 5' 2.25" (1.581 m)   Wt 137 lb 6.4 oz (62.3 kg)   SpO2 97%   BMI 24.93 kg/m    Physical Exam   General:  Well-developed,well-nourished,in no acute distress; alert,appropriate and cooperative throughout examination Head:  normocephalic and atraumatic.   Eyes:  vision grossly intact, PERRL Ears:  R ear normal and L ear normal externally, TMs clear bilaterally Nose:  no external deformity.   Mouth:  good dentition.   Neck:  No deformities, masses, or tenderness noted. Breasts:  No mass, nodules, thickening, tenderness, bulging, retraction, inflamation, nipple discharge or skin changes noted.   Lungs:  Normal respiratory effort, chest expands symmetrically. Lungs are clear to auscultation, no crackles or wheezes. Heart:  Normal rate and regular rhythm. S1 and S2 normal without gallop, murmur, click, rub or other extra sounds. Abdomen:  Bowel sounds  positive,abdomen soft and non-tender without masses, organomegaly or hernias noted. Msk:  No deformity or scoliosis noted of thoracic or lumbar spine.   Extremities:  No clubbing, cyanosis, edema, or deformity noted with normal full range of motion of all joints.   Neurologic:  alert & oriented X3 and gait normal.   Skin:  Intact without suspicious lesions or rashes Psych:  Cognition and judgment appear intact. Alert and cooperative with normal attention span and concentration. No apparent delusions, illusions, hallucinations       Assessment & Plan:   Well woman exam without gynecological exam  Diet-controlled diabetes mellitus ([place]) - Plan: Hemoglobin A1c, Comprehensive metabolic panel, Lipid panel, TSH  Hyperlipidemia, unspecified hyperlipidemia type  Essential hypertension  COPD Grade C No follow-ups on file.

## 2018-03-11 NOTE — Assessment & Plan Note (Signed)
Well controlled. No changes made today. 

## 2018-03-11 NOTE — Assessment & Plan Note (Signed)
Continue current dose of crestor. Check labs today.

## 2018-03-17 ENCOUNTER — Telehealth: Payer: Self-pay | Admitting: Pulmonary Disease

## 2018-03-17 MED ORDER — BUDESONIDE-FORMOTEROL FUMARATE 160-4.5 MCG/ACT IN AERO: 2.0000 | INHALATION_SPRAY | Freq: Two times a day (BID) | RESPIRATORY_TRACT | 3 refills | Status: DC

## 2018-03-17 NOTE — Telephone Encounter (Signed)
Rx of symbicort sent to pharmacy of pt's choice.  Called pt letting her know this was done. Pt expressed understanding. Nothing further needed.

## 2018-03-24 ENCOUNTER — Ambulatory Visit: Payer: BLUE CROSS/BLUE SHIELD | Admitting: Pulmonary Disease

## 2018-03-24 ENCOUNTER — Encounter: Payer: Self-pay | Admitting: Pulmonary Disease

## 2018-03-24 VITALS — BP 128/64 | HR 76 | Ht 62.0 in | Wt 137.4 lb

## 2018-03-24 DIAGNOSIS — J449 Chronic obstructive pulmonary disease, unspecified: Secondary | ICD-10-CM

## 2018-03-24 DIAGNOSIS — J301 Allergic rhinitis due to pollen: Secondary | ICD-10-CM

## 2018-03-24 MED ORDER — AEROCHAMBER MV MISC: 0 refills | Status: DC

## 2018-03-24 NOTE — Progress Notes (Signed)
Subjective:    Patient ID: Monique Sanchez, female    DOB: [birth year], 67 y.o.   MRN: [id number]  Synopsis: First saw [facility] for GOLD Grade C COPD in 2015 01/2014 Simple spirometry> Ratio 36%, FEV1  0.54L  (24% pred) Allergic to flu shot. Due to this she is reluctant to take the pneumonia vaccine.   HPI  Chief Complaint  Patient presents with  . Follow-up    ROV, reports sinus symptoms of drainage    [name] says that she has been OK fo rthe last year.  She says that her allergies have bothered her a lot.  She loses her voice a lot and has a runny nose a lot.  She takes zyrtec and flonase.  She is pretty good about taking these allergy medicines.  She says it has been worse in the last 6 months.  She has a scratchy throat.   She had a cold in the last year and needed some antibiotics, only, no prednisone.    Past Medical History:  Diagnosis Date  . Arthritis   . Asthma   . Blockage of coronary artery of heart (HCC)    50% blockage single vessels  . Claustrophobia   . COPD (chronic obstructive pulmonary disease) ([place])   . Diverticular disease of colon 2010  . GERD (gastroesophageal reflux disease)   . Hypercholesteremia   . Hypertension   . Osteoarthritis   . Pre-diabetes   . Seasonal allergies      Review of Systems  Constitutional: Negative for chills, fatigue and fever.  HENT: Positive for postnasal drip. Negative for nosebleeds and rhinorrhea.   Respiratory: Negative for cough, shortness of breath and wheezing.   Cardiovascular: Negative for chest pain, palpitations and leg swelling.       Objective:   Physical Exam  Vitals:   03/24/18 0941  BP: 128/64  Pulse: 76  SpO2: 99%  Weight: 137 lb 6.4 oz (62.3 kg)  Height: 5\' 2"  (1.575 m)  RA  Gen: well appearing HENT: OP clear, TM's clear, neck supple PULM: CTA B, normal percussion CV: RRR, no mgr, trace edema GI: BS+, soft, nontender Derm: no cyanosis or rash Psyche: normal mood and affect        Assessment & Plan:   COPD Grade C  Seasonal allergic rhinitis due to pollen  Discussion: Despite her severe airflow obstruction this is been a stable interval for [name].  She only had one exacerbation in the last year.  We talked about the fact that if she has more than one the knots a problem and I need to see her sooner for a work-up.  She has been experiencing worsening allergic rhinitis.  We talked about different ways to deal with this today.  Plan: Allergic rhinitis: Try taking Allegra and Nasonex instead of Zyrtec and Flonase If this is not helpful then call me and I can call in a prescription for montelukast 10 mg daily.  We can also consider using nasal Astelin if needed as well  COPD: Keep taking Symbicort and Spiriva as you are doing If you have increasing chest tightness congestion or wheezing let me know and I can call in a prescription for a rescue inhaler called albuterol  We will see you back in 1 year or sooner if needed    Current Outpatient Medications:  .  acetaminophen (TYLENOL) 500 MG tablet, Take 500 mg by mouth daily as needed., Disp: , Rfl:  .  budesonide-formoterol (SYMBICORT) 160-4.5 MCG/ACT  inhaler, Inhale 2 puffs into the lungs 2 (two) times daily., Disp: 3 Inhaler, Rfl: 3 .  cetirizine (ZYRTEC) 10 MG tablet, Take 10 mg by mouth at bedtime., Disp: , Rfl:  .  glucose blood test strip, Check blood sugar once daily and as directed with onetouch ultra blue test strips. Dx E11.9, Disp: 100 each, Rfl: 0 .  hydrochlorothiazide (HYDRODIURIL) 25 MG tablet, Take 1 tablet (25 mg total) by mouth daily., Disp: 90 tablet, Rfl: 0 .  metoprolol succinate (TOPROL-XL) 25 MG 24 hr tablet, TAKE 1 TABLET DAILY, Disp: 90 tablet, Rfl: 3 .  omeprazole (PRILOSEC) 40 MG capsule, Take 1 capsule (40 mg total) by mouth daily., Disp: 90 capsule, Rfl: 0 .  ONETOUCH DELICA LANCETS 99I MISC, Check blood sugar once daily and as directed. Dx E11.9, Disp: 100 each, Rfl: 0 .  OVER THE  COUNTER MEDICATION, Phason- otc gas relief- prn, Disp: , Rfl:  .  rosuvastatin (CRESTOR) 5 MG tablet, Take 1 tablet (5 mg total) by mouth daily., Disp: 90 tablet, Rfl: 3 .  Spacer/Aero-Holding Chambers (AEROCHAMBER MV) inhaler, Use as instructed, Disp: 1 each, Rfl: 0 .  tiotropium (SPIRIVA) 18 MCG inhalation capsule, Place 1 capsule (18 mcg total) into inhaler and inhale daily., Disp: 90 capsule, Rfl: 3 .  triamcinolone (KENALOG) 0.025 % cream, Apply 1 application topically 2 (two) times daily., Disp: 15 g, Rfl: 2

## 2018-03-24 NOTE — Addendum Note (Signed)
Addended by: Maryanna Shape A on: 03/24/2018 10:28 AM   Modules accepted: Orders

## 2018-03-24 NOTE — Patient Instructions (Signed)
Allergic rhinitis: Try taking Allegra and Nasonex instead of Zyrtec and Flonase If this is not helpful then call me and I can call in a prescription for montelukast 10 mg daily.  We can also consider using nasal Astelin if needed as well  COPD: Keep taking Symbicort and Spiriva as you are doing If you have increasing chest tightness congestion or wheezing let me know and I can call in a prescription for a rescue inhaler called albuterol  We will see you back in 1 year or sooner if needed

## 2018-04-13 ENCOUNTER — Inpatient Hospital Stay: Admission: RE | Admit: 2018-04-13 | Payer: BLUE CROSS/BLUE SHIELD | Source: Ambulatory Visit

## 2018-04-16 ENCOUNTER — Telehealth: Payer: Self-pay | Admitting: Family Medicine

## 2018-04-16 NOTE — Telephone Encounter (Signed)
Copied from [place] [id number]. Topic: Quick Communication - Rx Refill/Question >> Apr 16, 2018 12:03 PM Bea Graff, NT wrote: Medication: metoprolol succinate (TOPROL-XL) 25 MG 24 hr tablet   Has the patient contacted their pharmacy? Yes.   (Agent: If no, request that the patient contact the pharmacy for the refill.) (Agent: If yes, when and what did the pharmacy advise?)  Preferred Pharmacy (with phone number or street name): [place], [place] [phone] (Phone) [phone] (Fax)      Agent: Please be advised that RX refills may take up to 3 business days. We ask that you follow-up with your pharmacy.

## 2018-04-19 MED ORDER — METOPROLOL SUCCINATE ER 25 MG PO TB24: 25.0000 mg | ORAL_TABLET | Freq: Every day | ORAL | 1 refills | Status: DC

## 2018-05-07 ENCOUNTER — Other Ambulatory Visit: Payer: Self-pay | Admitting: Family Medicine

## 2018-05-18 DIAGNOSIS — H5203 Hypermetropia, bilateral: Secondary | ICD-10-CM | POA: Diagnosis not present

## 2018-05-18 DIAGNOSIS — E119 Type 2 diabetes mellitus without complications: Secondary | ICD-10-CM | POA: Diagnosis not present

## 2018-05-18 LAB — HM DIABETES EYE EXAM

## 2018-08-27 DIAGNOSIS — J019 Acute sinusitis, unspecified: Secondary | ICD-10-CM | POA: Diagnosis not present

## 2018-09-10 DIAGNOSIS — I240 Acute coronary thrombosis not resulting in myocardial infarction: Secondary | ICD-10-CM | POA: Insufficient documentation

## 2018-09-10 NOTE — Progress Notes (Signed)
Subjective:   Patient ID: Monique Sanchez, female    DOB: 08/21/1951, 67 y.o.   MRN: [id number]  Monique Sanchez is a pleasant 67 y.o. year old female who presents to clinic today with Diabetes (Patient is here today for a 26-month-F/U of diet-controlled DM.  At 6.13.19 visit her A1C was 6.6  which had risen slightly from 12.2018 reading of 6.4.  Her fasting BS this am was 126.)  on 09/13/2018  HPI:  Here for 6 month follow up.  Chart reviewed.  Last saw Addie on 03/11/18-  DM- diagnosed in 10/2014.  At that time, started her on Metformin 500 mg daily and referred to diabetic teaching which she did attend.  Has since controlling with diet alone.  Her fasting FSBS this morning was 126.  Denies any episodes or symptoms of hypoglycemia.  Lab Results  Component Value Date   HGBA1C 5.9 09/13/2018  Has declined pneumococcal vaccination.  Her daughter was diagnosed with 1 DM at the age of 35 last year.  HLD- Takes Crestor 5 mg daily.  Lab Results  Component Value Date   CHOL 177 03/11/2018   HDL 65.80 03/11/2018   LDLCALC 89 03/11/2018   TRIG 112.0 03/11/2018   CHOLHDL 3 03/11/2018   Lab Results  Component Value Date   ALT 23 03/11/2018   AST 27 03/11/2018   ALKPHOS 71 03/11/2018   BILITOT 0.7 03/11/2018     HTN- well controlled on HCTZ and toprol XL.  Lab Results  Component Value Date   CREATININE 0.84 03/11/2018   COPD Grade C- last saw Dr. Lake Bells on 03/24/18- No changes made to her rxs- advised to continue Symbicort and Spirva but he did advise trial of Allegra and Nasonex instead of Zyrtec and Flonase.  She tried Human resources officer but it made her shaky.  Current Outpatient Medications on File Prior to Visit  Medication Sig Dispense Refill  . acetaminophen (TYLENOL) 500 MG tablet Take 500 mg by mouth daily as needed.    . budesonide-formoterol (SYMBICORT) 160-4.5 MCG/ACT inhaler Inhale 2 puffs into the lungs 2 (two) times daily. 3 Inhaler 3  . cetirizine (ZYRTEC) 10  MG tablet Take 10 mg by mouth at bedtime.    Marland Kitchen glucose blood test strip Check blood sugar once daily and as directed with onetouch ultra blue test strips. Dx E11.9 100 each 0  . hydrochlorothiazide (HYDRODIURIL) 25 MG tablet TAKE 1 TABLET BY MOUTH EVERY DAY 90 tablet 1  . metoprolol succinate (TOPROL-XL) 25 MG 24 hr tablet Take 1 tablet (25 mg total) by mouth daily. 90 tablet 1  . omeprazole (PRILOSEC) 40 MG capsule TAKE 1 CAPSULE BY MOUTH EVERY DAY 90 capsule 1  . ONETOUCH DELICA LANCETS 87F MISC Check blood sugar once daily and as directed. Dx E11.9 100 each 0  . OVER THE COUNTER MEDICATION Phason- otc gas relief- prn    . rosuvastatin (CRESTOR) 5 MG tablet Take 1 tablet (5 mg total) by mouth daily. 90 tablet 3  . Spacer/Aero-Holding Chambers (AEROCHAMBER MV) inhaler Use as instructed 1 each 0  . tiotropium (SPIRIVA) 18 MCG inhalation capsule Place 1 capsule (18 mcg total) into inhaler and inhale daily. 90 capsule 3  . triamcinolone (KENALOG) 0.025 % cream Apply 1 application topically 2 (two) times daily. 15 g 2   No current facility-administered medications on file prior to visit.     Allergies  Allergen Reactions  . Influenza Vaccines     Muscle cramps, shaking  .  Trelegy Ellipta [Fluticasone-Umeclidin-Vilant] Hives    Itching in face/neck    Past Medical History:  Diagnosis Date  . Arthritis   . Asthma   . Blockage of coronary artery of heart (HCC)    50% blockage single vessels  . Claustrophobia   . COPD (chronic obstructive pulmonary disease) ([place])   . Diverticular disease of colon 2010  . GERD (gastroesophageal reflux disease)   . Hypercholesteremia   . Hypertension   . Osteoarthritis   . Pre-diabetes   . Seasonal allergies     Past Surgical History:  Procedure Laterality Date  . ABLATION ON ENDOMETRIOSIS  1976  . ANTERIOR CERVICAL DECOMP/DISCECTOMY FUSION  04/20/2014   Procedure: ANTERIOR CERVICAL DECOMPRESSION/DISCECTOMY FUSION 2 LEVELS;  Surgeon: Sinclair Ship, MD;  Location: [facility];  Service: Orthopedics;;  Anterior cervical decompression fusion, cervical 4-5, cervical 5-6 with instrumentation and allograft, possible C4 corpectomy.  Marland Kitchen CARDIAC CATHETERIZATION  2010   [facility]; Dr. Josefa Half  . CATARACT EXTRACTION W/ INTRAOCULAR LENS IMPLANT Left 05/2014  . 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  . CHOLECYSTECTOMY  2011  . COLON RESECTION     for diverticulitis  . COLON SURGERY Left 2010  . COLONOSCOPY WITH PROPOFOL N/A 07/08/2017   Procedure: COLONOSCOPY WITH PROPOFOL;  Surgeon: Manya Silvas, MD;  Location: [facility] ENDOSCOPY;  Service: Endoscopy;  Laterality: N/A;  . FOOT SURGERY  1995    Family History  Problem Relation Age of Onset  . Heart disease Mother   . Cancer Mother        lung metastatic site  . Heart disease Father   . Cancer Brother        lung  . Hyperlipidemia Brother   . Hypertension Brother   . Breast cancer Paternal Grandmother     Social History   Socioeconomic History  . Marital status: Single    Spouse name: Not on file  . Number of children: Not on file  . Years of education: Not on file  . Highest education level: Not on file  Occupational History  . Not on file  Social Needs  . Financial resource strain: Not on file  . Food insecurity:    Worry: Not on file    Inability: Not on file  . Transportation needs:    Medical: Not on file    Non-medical: Not on file  Tobacco Use  . Smoking status: Former Smoker    Packs/day: 0.50    Years: 30.00    Pack years: 15.00    Types: Cigarettes    Last attempt to quit: 09/30/2007    Years since quitting: 10.9  . Smokeless tobacco: Never Used  Substance and Sexual Activity  . Alcohol use: Yes    Alcohol/week: 0.0 standard drinks    Comment: occasional wine,none in 24hrs  . Drug use: No  . Sexual activity: Not on file  Lifestyle  . Physical  activity:    Days per week: Not on file    Minutes per session: Not on file  . Stress: Not on file  Relationships  . Social connections:    Talks on phone: Not on file    Gets together: Not on file    Attends religious service: Not on file    Active member of club or organization: Not on file    Attends meetings of clubs or organizations: Not on file  Relationship status: Not on file  . Intimate partner violence:    Fear of current or ex partner: Not on file    Emotionally abused: Not on file    Physically abused: Not on file    Forced sexual activity: Not on file  Other Topics Concern  . Not on file  Social History Narrative  . Not on file   The PMH, PSH, Social History, Family History, Medications, and allergies have been reviewed in [facility], and have been updated if relevant.  Review of Systems  Constitutional: Negative.   HENT: Negative.   Eyes: Negative.   Respiratory: Negative.   Cardiovascular: Negative.   Gastrointestinal: Negative.   Endocrine: Negative.   Genitourinary: Negative.   Musculoskeletal: Negative.   Skin: Negative.   Allergic/Immunologic: Negative.   Neurological: Negative.   Hematological: Negative.   Psychiatric/Behavioral: Negative.   All other systems reviewed and are negative.      Objective:    BP 110/72 (BP Location: Left Arm, Patient Position: Sitting, Cuff Size: Normal)   Pulse 81   Temp 98.7 F (37.1 C) (Oral)   Ht 5\' 2"  (1.575 m)   Wt 139 lb 3.2 oz (63.1 kg)   SpO2 97%   BMI 25.46 kg/m   Wt Readings from Last 3 Encounters:  09/13/18 139 lb 3.2 oz (63.1 kg)  03/24/18 137 lb 6.4 oz (62.3 kg)  03/11/18 137 lb 6.4 oz (62.3 kg)     Physical Exam Vitals signs and nursing note reviewed.  Constitutional:      Appearance: Normal appearance.  HENT:     Head: Normocephalic.     Mouth/Throat:     Mouth: Mucous membranes are moist.  Neck:     Musculoskeletal: Normal range of motion.  Cardiovascular:     Rate and Rhythm: Normal  rate and regular rhythm.     Pulses: Normal pulses.     Heart sounds: Normal heart sounds.  Pulmonary:     Effort: Pulmonary effort is normal.     Breath sounds: Normal breath sounds.  Musculoskeletal: Normal range of motion.  Skin:    General: Skin is warm.  Neurological:     General: No focal deficit present.     Mental Status: She is alert.     Cranial Nerves: No cranial nerve deficit.     Motor: No weakness.  Psychiatric:        Mood and Affect: Mood normal.        Behavior: Behavior normal.        Thought Content: Thought content normal.        Judgment: Judgment normal.           Assessment & Plan:   Essential hypertension  Blockage of coronary artery of heart (HCC)  COPD Grade C  Diet-controlled diabetes mellitus ([place]) - Plan: POCT HgB A1C  Hyperlipidemia, unspecified hyperlipidemia type No follow-ups on file.

## 2018-09-13 ENCOUNTER — Ambulatory Visit: Payer: BLUE CROSS/BLUE SHIELD | Admitting: Family Medicine

## 2018-09-13 ENCOUNTER — Encounter: Payer: Self-pay | Admitting: Family Medicine

## 2018-09-13 VITALS — BP 110/72 | HR 81 | Temp 98.7°F | Ht 62.0 in | Wt 139.2 lb

## 2018-09-13 DIAGNOSIS — E119 Type 2 diabetes mellitus without complications: Secondary | ICD-10-CM

## 2018-09-13 DIAGNOSIS — E785 Hyperlipidemia, unspecified: Secondary | ICD-10-CM

## 2018-09-13 DIAGNOSIS — I240 Acute coronary thrombosis not resulting in myocardial infarction: Secondary | ICD-10-CM

## 2018-09-13 DIAGNOSIS — I10 Essential (primary) hypertension: Secondary | ICD-10-CM

## 2018-09-13 DIAGNOSIS — J449 Chronic obstructive pulmonary disease, unspecified: Secondary | ICD-10-CM

## 2018-09-13 LAB — POCT GLYCOSYLATED HEMOGLOBIN (HGB A1C): HEMOGLOBIN A1C: 5.9 % (ref 4.0–5.6)

## 2018-09-13 NOTE — Assessment & Plan Note (Signed)
Diet controlled- improved today. Lab Results  Component Value Date   HGBA1C 5.9 09/13/2018

## 2018-09-13 NOTE — Assessment & Plan Note (Signed)
Well controlled.  NO changes made. 

## 2018-09-13 NOTE — Patient Instructions (Addendum)
Great to see you. Happy Holidays.   

## 2018-09-13 NOTE — Assessment & Plan Note (Signed)
At goal for diabetic. No changes made to rxs today. Lab Results  Component Value Date   CHOL 177 03/11/2018   HDL 65.80 03/11/2018   LDLCALC 89 03/11/2018   TRIG 112.0 03/11/2018   CHOLHDL 3 03/11/2018

## 2018-09-13 NOTE — Assessment & Plan Note (Signed)
Followed by pulmonary 

## 2018-09-26 DIAGNOSIS — J441 Chronic obstructive pulmonary disease with (acute) exacerbation: Secondary | ICD-10-CM | POA: Diagnosis not present

## 2018-10-11 ENCOUNTER — Other Ambulatory Visit: Payer: Self-pay | Admitting: Family Medicine

## 2018-10-13 ENCOUNTER — Telehealth: Payer: Self-pay | Admitting: Family Medicine

## 2018-10-13 NOTE — Telephone Encounter (Signed)
Copied from [place] [id number]. Topic: Quick Communication - Rx Refill/Question >> Oct 13, 2018  1:51 PM Windy Kalata wrote: Medication: metoprolol succinate (TOPROL-XL) 25 MG 24 hr tablet  hydrochlorothiazide (HYDRODIURIL) 25 MG tablet    Has the patient contacted their pharmacy? Yes.   (Agent: If no, request that the patient contact the pharmacy for the refill.) (Agent: If yes, when and what did the pharmacy advise?) Call office for refill  Preferred Pharmacy (with phone number or street name): [place], [place] [phone] (Phone) [phone] (Fax)    Agent: Please be advised that RX refills may take up to 3 business days. We ask that you follow-up with your pharmacy.

## 2018-10-19 IMAGING — MR MR SHOULDER*R* W/O CM
4 of 5 series · 27 of 40 positions shown · non-contrast
Comparison: None.

CLINICAL DATA: Right shoulder pain for several months.

EXAM:
MRI OF THE RIGHT SHOULDER WITHOUT CONTRAST
TECHNIQUE: Multiplanar, multisequence MR imaging of the shoulder was performed.
No intravenous contrast was administered.

[Series 4: T2 fat-sat · oblique · 4.0mm · 0.55mm/px · 8 of 20 slices shown (1 of 3)]
[im 1/20]
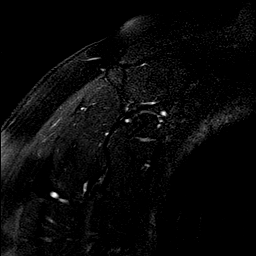
[im 3/20]
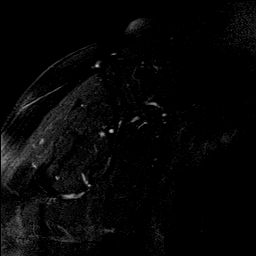
[im 6/20]
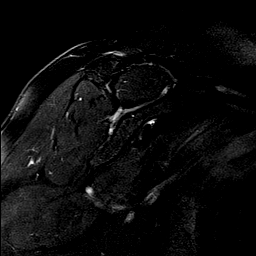
[im 9/20]
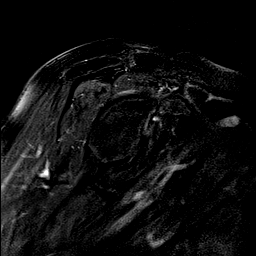
[im 11/20]
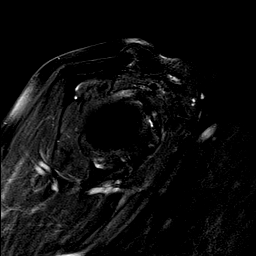
[im 14/20]
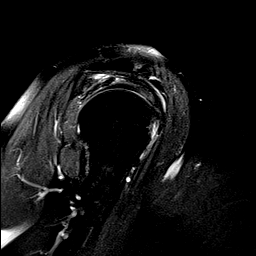
[im 17/20]
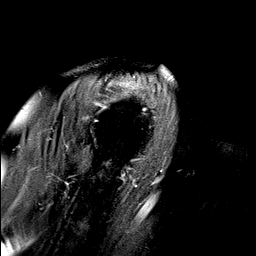
[im 20/20]
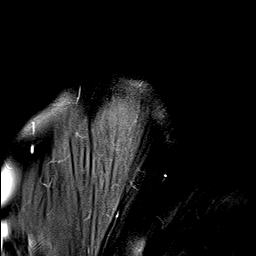

[Series 6: T2 fat-sat · axial · 4.0mm · 0.55mm/px · z∈[-7,+77]mm · 9 of 20 slices shown (2 of 3)]
[im 1/20]
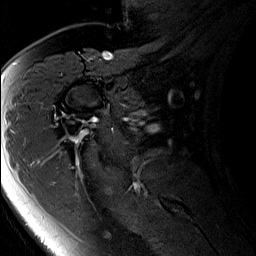
[im 3/20]
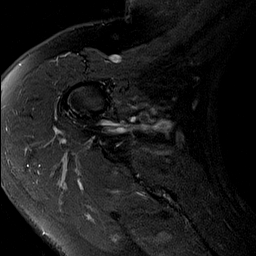
[im 5/20]
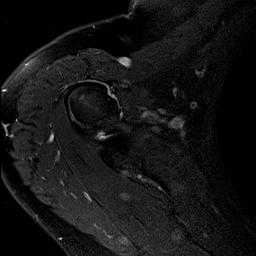
[im 8/20]
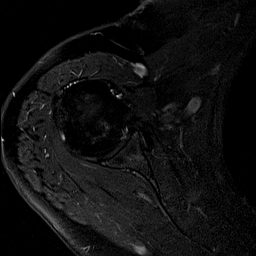
[im 10/20]
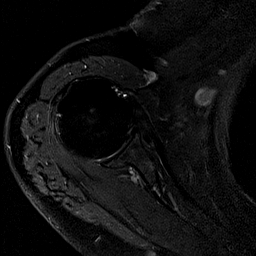
[im 12/20]
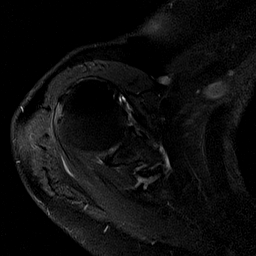
[im 15/20]
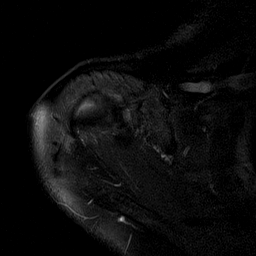
[im 17/20]
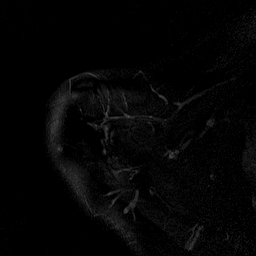
[im 20/20]
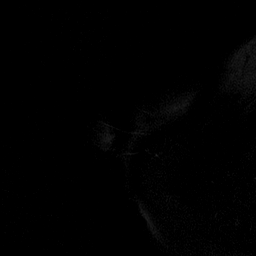

[Series 7: T2 fat-sat · oblique · 4.0mm · 0.27mm/px · 3 of 17 slices shown (3 of 3)]
[im 3/17]
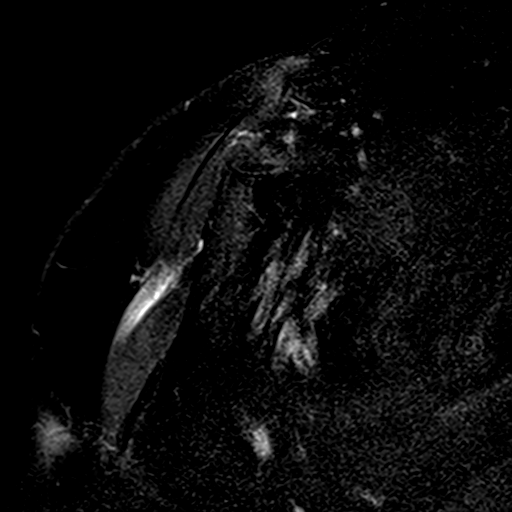
[im 9/17]
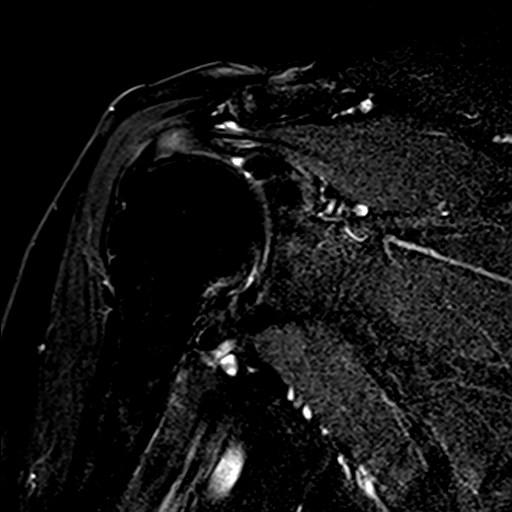
[im 14/17]
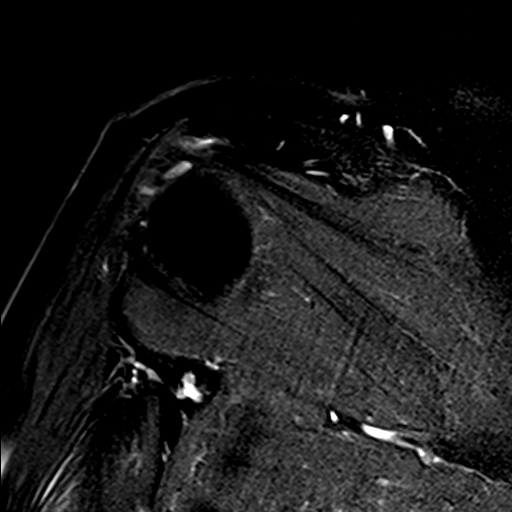

[Series 8: PD · oblique · 4.0mm · 0.22mm/px · 7 of 17 slices shown]
[im 1/17]
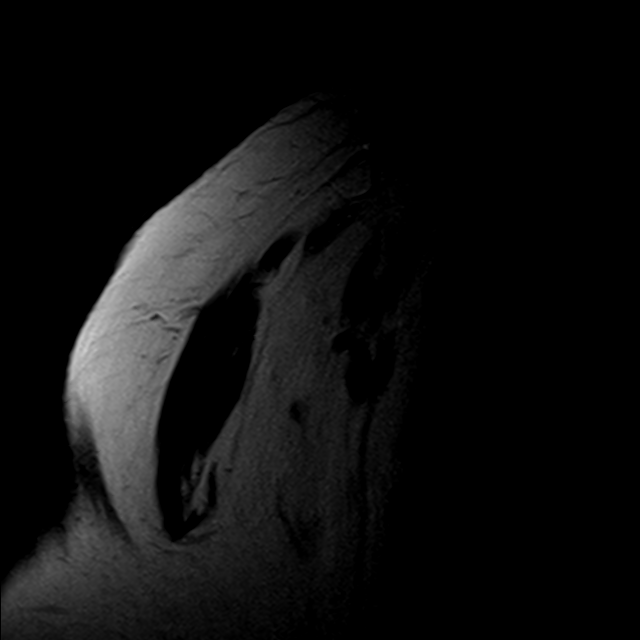
[im 3/17]
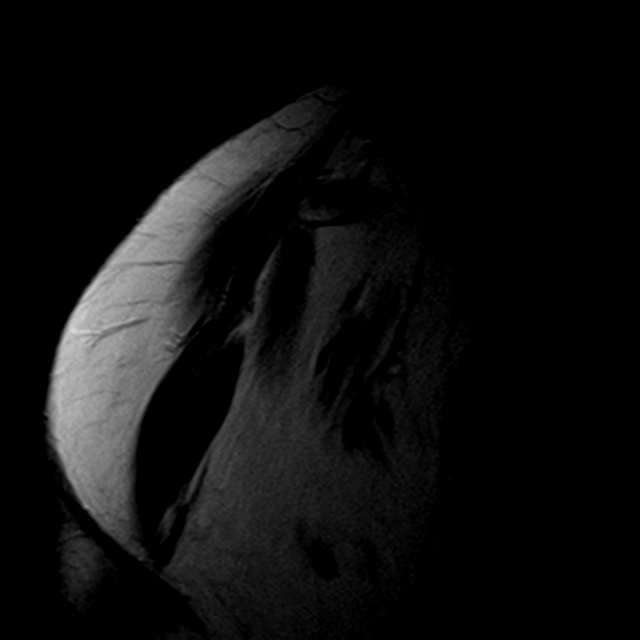
[im 6/17]
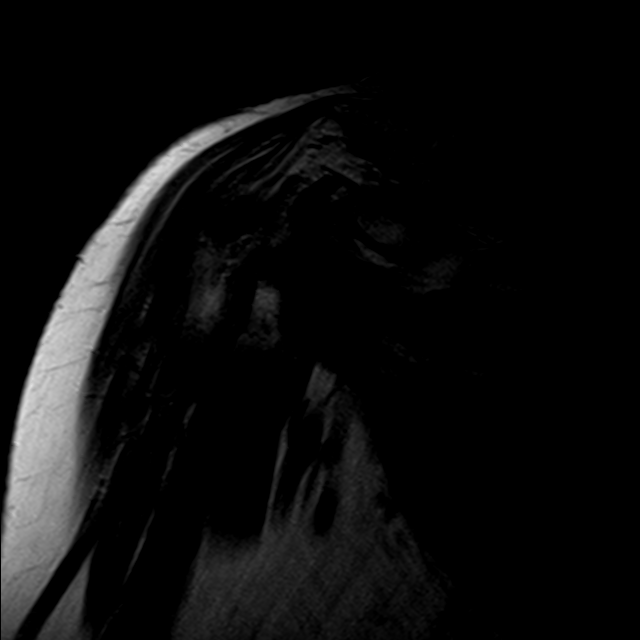
[im 9/17]
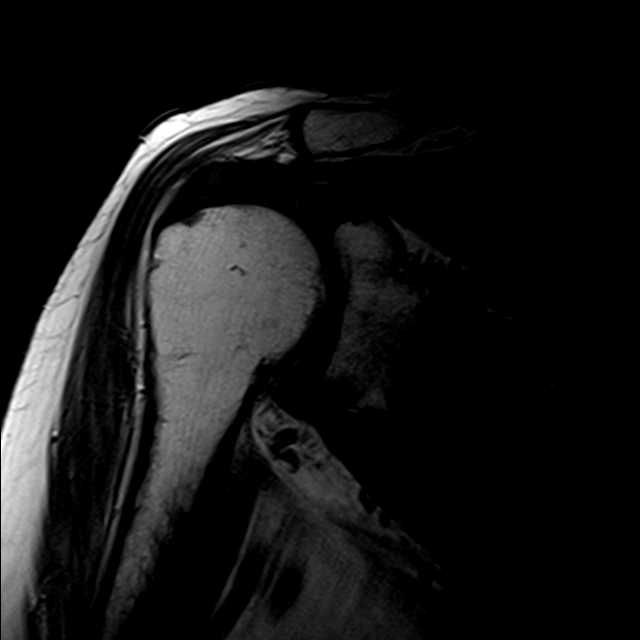
[im 11/17]
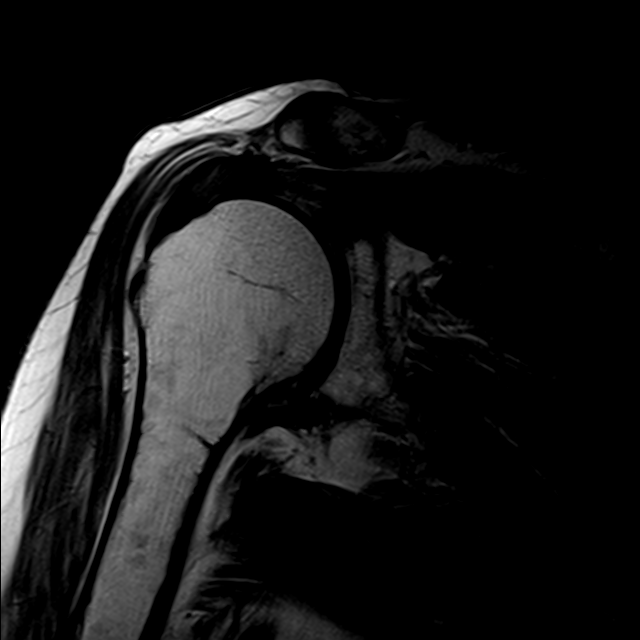
[im 14/17]
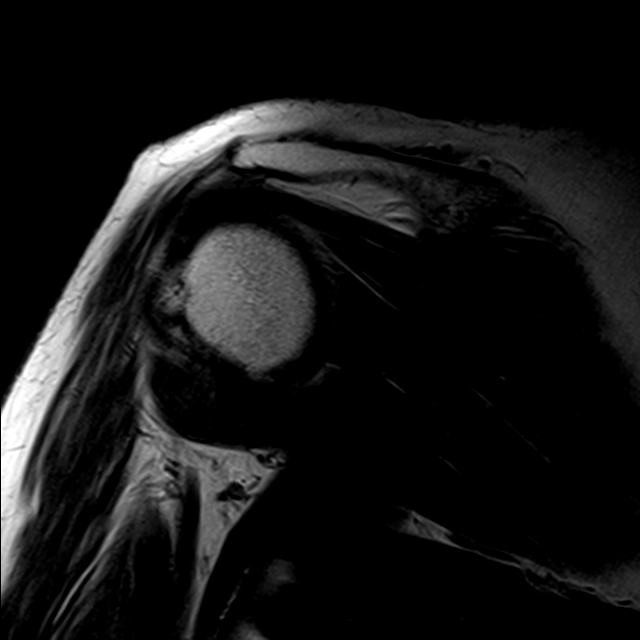
[im 17/17]
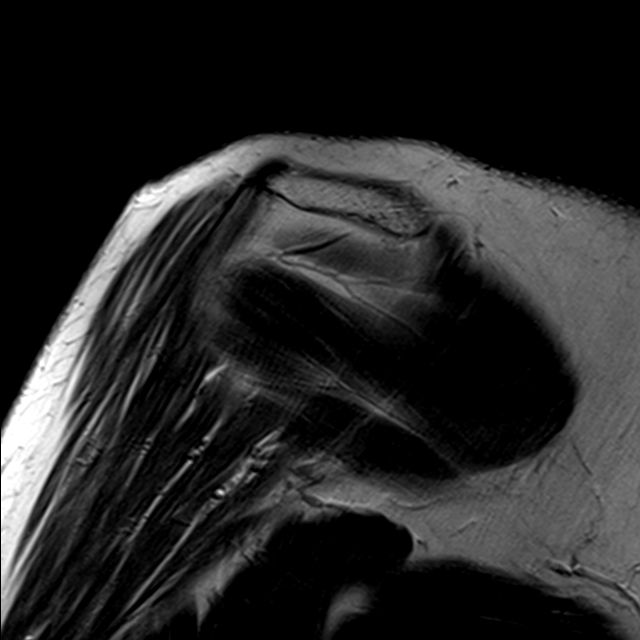

[27 of 40 positions shown; findings below may reference images not displayed]

FINDINGS: Rotator cuff: Significant rotator cuff tendinopathy/ tendinosis with
thickened edematous appearing infraspinatus and supraspinatus
tendons with intrasubstance tears. Suspect articular surface tear
involving the supraspinatus and infraspinatus junction region with
approximately 6 mm of laminar retraction of some of the articular
fibers. Moderate subscapularis tendinopathy with a partial thickness
articular surface tear with 9 mm of laminar retraction of the
articular fibers.

Muscles:  Mild fatty atrophy of all the shoulder muscles.

Biceps long head: The intra-articular portion is not well identified
on any of the imaging sequences. It may be chronically torn and
slightly retracted.

Acromioclavicular Joint: Mild to moderate degenerative changes Type
1-2 acromion. Mild lateral downsloping and mild undersurface
spurring.

Glenohumeral Joint: Mild to moderate degenerative changes with
degenerative chondrosis, joint space narrowing and early spurring.
No joint effusion or significant synovitis.

Labrum:  Labral degenerative changes without obvious tear.

Bones:  No acute bony findings.

Other: No subacromial/subdeltoid fluid collections to suggest
bursitis.
IMPRESSION: 1. Significant rotator cuff tendinopathy/tendinosis. Articular
surface tears involving the supraspinous infraspinatus junction
region and also the subscapularis tendon but no full-thickness
retracted tear.
2. The intra-articular portion of the long head biceps tendon is not
well identified. May be torn and mildly retracted.
3. Labral degenerative changes without discrete tear.
4. No significant findings for bony impingement.
5. Mild to moderate glenohumeral joint degenerative changes.

## 2018-10-20 ENCOUNTER — Telehealth: Payer: Self-pay | Admitting: Pulmonary Disease

## 2018-10-20 NOTE — Telephone Encounter (Signed)
Attempted to try to call pt again but was unable to reach her. Left message for pt to return call. Stated in the message that we should get pt to come in for an appt based on her symptoms.  When pt returns call, please schedule her an appt. Thanks!

## 2018-10-20 NOTE — Telephone Encounter (Signed)
Attempted to call patient we should offer an appointment when call is returned.

## 2018-10-21 NOTE — Telephone Encounter (Signed)
Pt is returning call CB# [phone] press 2/kob

## 2018-10-21 NOTE — Telephone Encounter (Signed)
Spoke with the pt  She c/o cough for the past several wks  She states that she has had bronchitis, seen at a walk in clinic and she is no better  I advised she needs ov and offered appt for today or tomorrow with NP  She refuses appt and states she will [facility] to [facility] since they could see her later that we could accommodate  I offered to get more details about her symptoms so I could send a msg to BQ for recs but she also refused this

## 2018-10-21 NOTE — Telephone Encounter (Signed)
ATC patient.  Left message for Patient to call back. 

## 2018-10-24 DIAGNOSIS — J019 Acute sinusitis, unspecified: Secondary | ICD-10-CM | POA: Diagnosis not present

## 2018-10-24 DIAGNOSIS — B9689 Other specified bacterial agents as the cause of diseases classified elsewhere: Secondary | ICD-10-CM | POA: Diagnosis not present

## 2018-10-24 DIAGNOSIS — J40 Bronchitis, not specified as acute or chronic: Secondary | ICD-10-CM | POA: Diagnosis not present

## 2018-10-24 DIAGNOSIS — J441 Chronic obstructive pulmonary disease with (acute) exacerbation: Secondary | ICD-10-CM | POA: Diagnosis not present

## 2018-10-28 ENCOUNTER — Other Ambulatory Visit: Payer: Self-pay | Admitting: Family Medicine

## 2018-10-28 MED ORDER — HYDROCHLOROTHIAZIDE 25 MG PO TABS: 25.0000 mg | ORAL_TABLET | Freq: Every day | ORAL | 1 refills | Status: DC

## 2018-10-28 NOTE — Telephone Encounter (Signed)
Copied from [place] [id number]. Topic: Quick Communication - Rx Refill/Question >> Oct 28, 2018  2:47 PM Windy Kalata wrote: Medication: hydrochlorothiazide (HYDRODIURIL) 25 MG tablet  Has the patient contacted their pharmacy? Yes.   (Agent: If no, request that the patient contact the pharmacy for the refill.) (Agent: If yes, when and what did the pharmacy advise?)  Preferred Pharmacy (with phone number or street name): [place], [place] [phone] (Phone) [phone] (Fax)    Agent: Please be advised that RX refills may take up to 3 business days. We ask that you follow-up with your pharmacy.

## 2018-10-28 NOTE — Telephone Encounter (Signed)
Requested Prescriptions  Pending Prescriptions Disp Refills  . hydrochlorothiazide (HYDRODIURIL) 25 MG tablet 90 tablet 1    Sig: Take 1 tablet (25 mg total) by mouth daily.     Cardiovascular: Diuretics - Thiazide Passed - 10/28/2018  2:53 PM      Passed - Ca in normal range and within 360 days    Calcium  Date Value Ref Range Status  03/11/2018 9.2 8.4 - 10.5 mg/dL Final         Passed - Cr in normal range and within 360 days    Creatinine, Ser  Date Value Ref Range Status  03/11/2018 0.84 0.40 - 1.20 mg/dL Final         Passed - K in normal range and within 360 days    Potassium  Date Value Ref Range Status  03/11/2018 3.7 3.5 - 5.1 mEq/L Final         Passed - Na in normal range and within 360 days    Sodium  Date Value Ref Range Status  03/11/2018 136 135 - 145 mEq/L Final         Passed - Last BP in normal range    BP Readings from Last 1 Encounters:  09/13/18 110/72         Passed - Valid encounter within last 6 months    Recent Outpatient Visits          1 month ago Essential hypertension   [facility], Marciano Sequin, MD   7 months ago Well woman exam without gynecological exam   [facility], Marciano Sequin, MD   1 year ago Diet-controlled diabetes mellitus [facility])   [facility] Lucille Passy, MD   4 years ago Screening for breast cancer   [place], Latina Craver, NP      Future Appointments            In 4 months Lucille Passy, MD [facility], [facility]

## 2018-11-23 ENCOUNTER — Other Ambulatory Visit: Payer: Self-pay | Admitting: Family Medicine

## 2018-11-23 MED ORDER — OMEPRAZOLE 40 MG PO CPDR: DELAYED_RELEASE_CAPSULE | ORAL | 0 refills | Status: DC

## 2018-11-23 NOTE — Telephone Encounter (Signed)
Copied from [place] [phone]. Topic: Quick Communication - Rx Refill/Question >> Nov 23, 2018 12:06 PM Rayann Heman wrote: Medication: omeprazole (PRILOSEC) 40 MG capsule [[id number]]   Has the patient contacted their pharmacy? yes Preferred Pharmacy (with phone number or street name): [place], [place] [phone] (Phone) [phone] (Fax)   Agent: Please be advised that RX refills may take up to 3 business days. We ask that you follow-up with your pharmacy.

## 2018-11-23 NOTE — Telephone Encounter (Signed)
Requested Prescriptions  Pending Prescriptions Disp Refills  . omeprazole (PRILOSEC) 40 MG capsule 90 capsule 0    Sig: TAKE 1 CAPSULE BY MOUTH EVERY DAY     Gastroenterology: Proton Pump Inhibitors Passed - 11/23/2018 12:14 PM      Passed - Valid encounter within last 12 months    Recent Outpatient Visits          2 months ago Essential hypertension   [facility], Marciano Sequin, MD   8 months ago Well woman exam without gynecological exam   [facility], Marciano Sequin, MD   1 year ago Diet-controlled diabetes mellitus [facility])   [facility] Lucille Passy, MD   4 years ago Screening for breast cancer   [place], Latina Craver, NP      Future Appointments            In 3 months Lucille Passy, MD [facility], [facility]

## 2018-12-20 ENCOUNTER — Telehealth: Payer: Self-pay | Admitting: Pulmonary Disease

## 2018-12-20 NOTE — Telephone Encounter (Signed)
To whom it may concern:  Ms. Urias has been my patient for a number of years.  She has severe COPD.  Based on the current coronavirus pandemic she is very high risk for serious complications from this condition.  I strongly recommend that she be given short-term disability from work so that she can self isolate and protect herself from the threat of this illness.  Sincerely,  Roselie Awkward MD

## 2018-12-20 NOTE — Telephone Encounter (Signed)
Call made to patient, states she has severe COPD, so she is requesting a letter stating she needs short term disability. She states currently she is stable but wanted to get BQ to write a letter for her. Made aware BQ is not in office today but we would get him the message.   BQ please advise.

## 2018-12-21 ENCOUNTER — Encounter: Payer: Self-pay | Admitting: *Deleted

## 2018-12-21 NOTE — Telephone Encounter (Signed)
Pt is calling back [phone] Option #2

## 2018-12-21 NOTE — Telephone Encounter (Signed)
Spoke with pt and advised that letter was completed by Dr Lake Bells.  Pt asked that I mail a copy to her.  Letter placed in outgoing mail.  Nothing further needed at this time.

## 2018-12-21 NOTE — Telephone Encounter (Signed)
Letter has been drafted and is under the "Letters" tab in the pt's chart.  LMTCB x1 for pt.

## 2018-12-23 ENCOUNTER — Telehealth: Payer: Self-pay | Admitting: Family Medicine

## 2018-12-23 MED ORDER — TIOTROPIUM BROMIDE MONOHYDRATE 18 MCG IN CAPS: 18.0000 ug | ORAL_CAPSULE | Freq: Every day | RESPIRATORY_TRACT | 1 refills | Status: DC

## 2018-12-23 NOTE — Telephone Encounter (Signed)
Copied from [name] [id number]. Topic: Quick Communication - Rx Refill/Question >> Dec 23, 2018  1:20 PM Mcneil, Ja-Kwan wrote: Medication: tiotropium (SPIRIVA) 18 MCG inhalation capsule  Has the patient contacted their pharmacy? yes   Preferred Pharmacy (with phone number or street name): [place], [place] [phone] (Phone) [phone] (Fax)  Agent: Please be advised that RX refills may take up to 3 business days. We ask that you follow-up with your pharmacy.

## 2018-12-23 NOTE — Telephone Encounter (Signed)
Rx sent 

## 2019-02-02 ENCOUNTER — Telehealth: Payer: Self-pay | Admitting: Pulmonary Disease

## 2019-02-02 MED ORDER — BUDESONIDE-FORMOTEROL FUMARATE 160-4.5 MCG/ACT IN AERO: 2.0000 | INHALATION_SPRAY | Freq: Two times a day (BID) | RESPIRATORY_TRACT | 1 refills | Status: DC

## 2019-02-02 NOTE — Telephone Encounter (Signed)
Rx sent to mail order as pt requested. Pt notified Nothing further needed.

## 2019-02-15 ENCOUNTER — Telehealth: Payer: Self-pay | Admitting: Family Medicine

## 2019-02-15 MED ORDER — METOPROLOL SUCCINATE ER 25 MG PO TB24: 25.0000 mg | ORAL_TABLET | Freq: Every day | ORAL | 0 refills | Status: DC

## 2019-02-15 NOTE — Telephone Encounter (Signed)
Sent in Rx for 90d/she has a CPE coming up/I am having Glenard Haring look into this patients' insurance to advise if she qualifies for an AWV/thx dmf

## 2019-02-15 NOTE — Telephone Encounter (Unsigned)
Copied from [place] [id number]. Topic: Quick Communication - Rx Refill/Question >> Feb 15, 2019 10:40 AM Yvette Rack wrote: Medication: metoprolol succinate (TOPROL-XL) 25 MG 24 hr tablet and omeprazole (PRILOSEC) 40 MG capsule  Has the patient contacted their pharmacy? yes   Preferred Pharmacy (with phone number or street name): [place], [place] [phone] (Phone) [phone] (Fax)  Agent: Please be advised that RX refills may take up to 3 business days. We ask that you follow-up with your pharmacy.

## 2019-02-17 NOTE — Telephone Encounter (Signed)
VB-Can you take a peek at this one to advise if she can have an [place]? I think I sent you a note when you were wayyyyy busy/I'll be back in the office on Tuesday/thx dmf

## 2019-02-18 ENCOUNTER — Other Ambulatory Visit: Payer: Self-pay | Admitting: Family Medicine

## 2019-02-18 MED ORDER — OMEPRAZOLE 40 MG PO CPDR: DELAYED_RELEASE_CAPSULE | ORAL | 0 refills | Status: DC

## 2019-03-15 NOTE — Progress Notes (Signed)
Subjective:   Patient ID: Monique Sanchez, female    DOB: [birth year], 68 y.o.   MRN: [id number]  Monique Sanchez is a pleasant 68 y.o. year old female who presents to clinic today with Annual Exam (Pt screened at vehicle.  She is here today for a CPE without PAP. Order for Mammogram & BMD from 6.2019 but not completed.  She is currently fasting. She declines PNV-13 vaccine. She agrees to call [place] to schedule BMD & Mammogram.)  on 03/16/2019  HPI:  Patient is here today for CPE without PAP. She is currently fasting.   Health Maintenance  Topic Date Due  . DEXA SCAN  11/16/2014  . MAMMOGRAM  07/19/2017  . URINE MICROALBUMIN  03/12/2019  . HEMOGLOBIN A1C  03/15/2019  . OPHTHALMOLOGY EXAM  05/19/2019  . FOOT EXAM  09/14/2019  . TETANUS/TDAP  03/01/2024  . COLONOSCOPY  07/09/2027  . Hepatitis C Screening  Discontinued  . PNA vac Low Risk Adult  Discontinued    DM- diagnosed in 10/2014.   At that  time, started her on Metformin 500 mg daily and referred to diabetic teaching which she did attend. She is eating well, feels "fine."  Has since controlling with diet alone.  Has declined pneumococcal vaccination.   On statin.  Lab Results  Component Value Date   HGBA1C 5.9 09/13/2018     HTN- well controlled on HCTZ and toprol XL. Lab Results  Component Value Date   CREATININE 0.84 03/11/2018    Lab Results  Component Value Date   HGBA1C 5.9 09/13/2018   Lab Results  Component Value Date   CHOL 177 03/11/2018   HDL 65.80 03/11/2018   LDLCALC 89 03/11/2018   TRIG 112.0 03/11/2018   CHOLHDL 3 03/11/2018    Current Outpatient Medications on File Prior to Visit  Medication Sig Dispense Refill  . acetaminophen (TYLENOL) 500 MG tablet Take 500 mg by mouth daily as needed.    . budesonide-formoterol (SYMBICORT) 160-4.5 MCG/ACT inhaler Inhale 2 puffs into the lungs 2 (two) times daily. 3 Inhaler 1  . cetirizine (ZYRTEC) 10 MG tablet Take 10 mg by mouth at bedtime.     Marland Kitchen glucose blood test strip Check blood sugar once daily and as directed with onetouch ultra blue test strips. Dx E11.9 100 each 0  . metoprolol succinate (TOPROL-XL) 25 MG 24 hr tablet Take 1 tablet (25 mg total) by mouth daily. 90 tablet 0  . omeprazole (PRILOSEC) 40 MG capsule TAKE 1 CAPSULE BY MOUTH EVERY DAY 90 capsule 0  . ONETOUCH DELICA LANCETS 84Y MISC Check blood sugar once daily and as directed. Dx E11.9 100 each 0  . OVER THE COUNTER MEDICATION Phason- otc gas relief- prn    . rosuvastatin (CRESTOR) 5 MG tablet Take 1 tablet (5 mg total) by mouth daily. 90 tablet 3  . Spacer/Aero-Holding Chambers (AEROCHAMBER MV) inhaler Use as instructed 1 each 0  . tiotropium (SPIRIVA) 18 MCG inhalation capsule Place 1 capsule (18 mcg total) into inhaler and inhale daily. 90 capsule 1  . triamcinolone (KENALOG) 0.025 % cream Apply 1 application topically 2 (two) times daily. 15 g 2   No current facility-administered medications on file prior to visit.     Allergies  Allergen Reactions  . Influenza Vaccines     Muscle cramps, shaking  . Trelegy Ellipta [Fluticasone-Umeclidin-Vilant] Hives    Itching in face/neck    Past Medical History:  Diagnosis Date  . Arthritis   .  Asthma   . Blockage of coronary artery of heart (HCC)    50% blockage single vessels  . Claustrophobia   . COPD (chronic obstructive pulmonary disease) ([place])   . Diverticular disease of colon 2010  . GERD (gastroesophageal reflux disease)   . Hypercholesteremia   . Hypertension   . Osteoarthritis   . Pre-diabetes   . Seasonal allergies     Past Surgical History:  Procedure Laterality Date  . ABLATION ON ENDOMETRIOSIS  1976  . ANTERIOR CERVICAL DECOMP/DISCECTOMY FUSION  04/20/2014   Procedure: ANTERIOR CERVICAL DECOMPRESSION/DISCECTOMY FUSION 2 LEVELS;  Surgeon: Sinclair Ship, MD;  Location: [facility];  Service: Orthopedics;;  Anterior cervical decompression fusion, cervical 4-5, cervical 5-6 with  instrumentation and allograft, possible C4 corpectomy.  Marland Kitchen CARDIAC CATHETERIZATION  2010   [facility]; Dr. Josefa Half  . CATARACT EXTRACTION W/ INTRAOCULAR LENS IMPLANT Left 05/2014  . 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  . CHOLECYSTECTOMY  2011  . COLON RESECTION     for diverticulitis  . COLON SURGERY Left 2010  . COLONOSCOPY WITH PROPOFOL N/A 07/08/2017   Procedure: COLONOSCOPY WITH PROPOFOL;  Surgeon: Manya Silvas, MD;  [facility] ENDOSCOPY;  Service: Endoscopy;  Laterality: N/A;  . FOOT SURGERY  1995    Family History  Problem Relation Age of Onset  . Heart disease Mother   . Cancer Mother        lung metastatic site  . Heart disease Father   . Cancer Brother        lung  . Hyperlipidemia Brother   . Hypertension Brother   . Breast cancer Paternal Grandmother     Social History   Socioeconomic History  . Marital status: Single    Spouse name: Not on file  . Number of children: Not on file  . Years of education: Not on file  . Highest education level: Not on file  Occupational History  . Not on file  Social Needs  . Financial resource strain: Not on file  . Food insecurity    Worry: Not on file    Inability: Not on file  . Transportation needs    Medical: Not on file    Non-medical: Not on file  Tobacco Use  . Smoking status: Former Smoker    Packs/day: 0.50    Years: 30.00    Pack years: 15.00    Types: Cigarettes    Quit date: 09/30/2007    Years since quitting: 11.4  . Smokeless tobacco: Never Used  Substance and Sexual Activity  . Alcohol use: Yes    Alcohol/week: 0.0 standard drinks    Comment: occasional wine,none in 24hrs  . Drug use: No  . Sexual activity: Not on file  Lifestyle  . Physical activity    Days per week: Not on file    Minutes per session: Not on file  . Stress: Not on file  Relationships  . Social Herbalist  on phone: Not on file    Gets together: Not on file    Attends religious service: Not on file    Active member of club or organization: Not on file    Attends meetings of clubs or organizations: Not on file    Relationship status: Not on file  . Intimate partner violence    Fear of current or ex partner: Not on file    Emotionally  abused: Not on file    Physically abused: Not on file    Forced sexual activity: Not on file  Other Topics Concern  . Not on file  Social History Narrative  . Not on file   The PMH, PSH, Social History, Family History, Medications, and allergies have been reviewed in [facility], and have been updated if relevant.   Review of Systems  Constitutional: Negative.   HENT: Negative.   Eyes: Negative.   Respiratory: Negative.   Cardiovascular: Negative.   Gastrointestinal: Negative.   Endocrine: Negative.   Genitourinary: Negative.   Musculoskeletal: Negative.   Allergic/Immunologic: Negative.   Neurological: Negative.   Hematological: Negative.   Psychiatric/Behavioral: Negative.   All other systems reviewed and are negative.      Objective:    BP 130/64 (BP Location: Left Arm, Patient Position: Sitting, Cuff Size: Normal)   Pulse 75   Temp 98.6 F (37 C) (Oral)   Ht 5\' 2"  (1.575 m)   Wt 141 lb 3.2 oz (64 kg)   SpO2 99%   BMI 25.83 kg/m   Wt Readings from Last 3 Encounters:  03/16/19 141 lb 3.2 oz (64 kg)  09/13/18 139 lb 3.2 oz (63.1 kg)  03/24/18 137 lb 6.4 oz (62.3 kg)     Physical Exam    General:  Well-developed,well-nourished,in no acute distress; alert,appropriate and cooperative throughout examination Head:  normocephalic and atraumatic.   Eyes:  vision grossly intact, PERRL Ears:  R ear normal and L ear normal externally, TMs clear bilaterally Nose:  no external deformity.   Mouth:  good dentition.   Neck:  No deformities, masses, or tenderness noted. Breasts:  No mass, nodules, thickening, tenderness, bulging, retraction,  inflamation, nipple discharge or skin changes noted.   Lungs:  Normal respiratory effort, chest expands symmetrically. Lungs are clear to auscultation, no crackles or wheezes. Heart:  Normal rate and regular rhythm. S1 and S2 normal without gallop, murmur, click, rub or other extra sounds. Abdomen:  Bowel sounds positive,abdomen soft and non-tender without masses, organomegaly or hernias noted. Msk:  No deformity or scoliosis noted of thoracic or lumbar spine.   Extremities:  No clubbing, cyanosis, edema, or deformity noted with normal full range of motion of all joints.   Neurologic:  alert & oriented X3 and gait normal.   Skin:  Intact without suspicious lesions or rashes Cervical Nodes:  No lymphadenopathy noted Axillary Nodes:  No palpable lymphadenopathy Psych:  Cognition and judgment appear intact. Alert and cooperative with normal attention span and concentration. No apparent delusions, illusions, hallucinations       Assessment & Plan:   Well woman exam without gynecological exam -  Essential hypertension -   Hyperlipidemia, unspecified hyperlipidemia type - Plan: Hemoglobin A1c, Comprehensive metabolic panel, Lipid panel, TSH,   Diet-controlled diabetes mellitus ([place]) - Plan: Hemoglobin A1c, TSH, Microalbumin / creatinine urine ratio,  No follow-ups on file.

## 2019-03-16 ENCOUNTER — Ambulatory Visit (INDEPENDENT_AMBULATORY_CARE_PROVIDER_SITE_OTHER): Payer: BC Managed Care – PPO | Admitting: Family Medicine

## 2019-03-16 ENCOUNTER — Encounter: Payer: Self-pay | Admitting: Family Medicine

## 2019-03-16 VITALS — BP 130/64 | HR 75 | Temp 98.6°F | Ht 62.0 in | Wt 141.2 lb

## 2019-03-16 DIAGNOSIS — E119 Type 2 diabetes mellitus without complications: Secondary | ICD-10-CM | POA: Diagnosis not present

## 2019-03-16 DIAGNOSIS — Z0000 Encounter for general adult medical examination without abnormal findings: Secondary | ICD-10-CM

## 2019-03-16 DIAGNOSIS — I10 Essential (primary) hypertension: Secondary | ICD-10-CM

## 2019-03-16 DIAGNOSIS — E785 Hyperlipidemia, unspecified: Secondary | ICD-10-CM | POA: Diagnosis not present

## 2019-03-16 LAB — COMPREHENSIVE METABOLIC PANEL
ALT: 26 U/L (ref 0–35)
AST: 28 U/L (ref 0–37)
Albumin: 4.4 g/dL (ref 3.5–5.2)
Alkaline Phosphatase: 81 U/L (ref 39–117)
BUN: 16 mg/dL (ref 6–23)
CO2: 29 mEq/L (ref 19–32)
Calcium: 9.2 mg/dL (ref 8.4–10.5)
Chloride: 100 mEq/L (ref 96–112)
Creatinine, Ser: 0.9 mg/dL (ref 0.40–1.20)
GFR: 62.02 mL/min (ref >60.00)
Glucose, Bld: 112 mg/dL — ABNORMAL HIGH (ref 70–99)
Potassium: 3.9 mEq/L (ref 3.5–5.1)
Sodium: 138 mEq/L (ref 135–145)
Total Bilirubin: 0.8 mg/dL (ref 0.2–1.2)
Total Protein: 6.8 g/dL (ref 6.0–8.3)

## 2019-03-16 LAB — MICROALBUMIN / CREATININE URINE RATIO
Creatinine,U: 42.6 mg/dL
Microalb Creat Ratio: 2.2 mg/g (ref 0.0–30.0)
Microalb, Ur: 1 mg/dL (ref 0.0–1.9)

## 2019-03-16 LAB — LIPID PANEL
Cholesterol: 169 mg/dL (ref 0–200)
HDL: 64.3 mg/dL (ref >39.00)
LDL Cholesterol: 72 mg/dL (ref 0–99)
NonHDL: 104.45
Total CHOL/HDL Ratio: 3
Triglycerides: 162 mg/dL — ABNORMAL HIGH (ref 0.0–149.0)
VLDL: 32.4 mg/dL (ref 0.0–40.0)

## 2019-03-16 LAB — TSH: TSH: 1.84 u[IU]/mL (ref 0.35–4.50)

## 2019-03-16 LAB — HEMOGLOBIN A1C: Hgb A1c MFr Bld: 6.7 % — ABNORMAL HIGH (ref 4.6–6.5)

## 2019-03-16 MED ORDER — HYDROCHLOROTHIAZIDE 25 MG PO TABS: 25.0000 mg | ORAL_TABLET | Freq: Every day | ORAL | 1 refills | Status: DC

## 2019-03-16 NOTE — Assessment & Plan Note (Signed)
Reviewed preventive care protocols, scheduled due services, and updated immunizations Discussed nutrition, exercise, diet, and healthy lifestyle.  Mammogram and BMD ordered and phone number for breast center given to pt to schedule her own mammogram.

## 2019-03-16 NOTE — Assessment & Plan Note (Signed)
Doing well on crestor 5 mg daily. Will check labs today. Orders Placed This Encounter  Procedures  . Hemoglobin A1c  . Comprehensive metabolic panel  . Lipid panel  . TSH  . Microalbumin / creatinine urine ratio

## 2019-03-16 NOTE — Patient Instructions (Addendum)
Great to see you!!! I will call you with your lab results from today and you can view them online.   Please call to schedule your mammogram and bone density.

## 2019-03-16 NOTE — Assessment & Plan Note (Signed)
Under good control.  No changes made today.

## 2019-03-16 NOTE — Assessment & Plan Note (Signed)
Has been diet controlled. Due for a1c and urine micro today.  Orders Placed This Encounter  Procedures  . Hemoglobin A1c  . Comprehensive metabolic panel  . Lipid panel  . TSH  . Microalbumin / creatinine urine ratio

## 2019-03-17 ENCOUNTER — Telehealth: Payer: Self-pay | Admitting: Family Medicine

## 2019-03-17 NOTE — Telephone Encounter (Signed)
Patient notified of results and documented in result note.

## 2019-03-17 NOTE — Telephone Encounter (Signed)
Copied from [place] [id number]. Topic: Quick Communication - Lab Results (Clinic Use ONLY) >> Mar 17, 2019  1:49 PM Prewette, Ovidio Hanger, LPN wrote: Called patient to inform them of 03/16/2019 lab results. When patient returns call, triage nurse may disclose results.   Pt called back for results

## 2019-04-23 ENCOUNTER — Other Ambulatory Visit: Payer: Self-pay | Admitting: Family Medicine

## 2019-07-08 DIAGNOSIS — H6062 Unspecified chronic otitis externa, left ear: Secondary | ICD-10-CM | POA: Diagnosis not present

## 2019-07-08 DIAGNOSIS — J301 Allergic rhinitis due to pollen: Secondary | ICD-10-CM | POA: Diagnosis not present

## 2019-07-08 DIAGNOSIS — B379 Candidiasis, unspecified: Secondary | ICD-10-CM | POA: Diagnosis not present

## 2019-07-28 ENCOUNTER — Other Ambulatory Visit: Payer: Self-pay | Admitting: Pulmonary Disease

## 2019-08-08 ENCOUNTER — Other Ambulatory Visit: Payer: Self-pay

## 2019-08-08 ENCOUNTER — Telehealth: Payer: Self-pay | Admitting: Family Medicine

## 2019-08-08 MED ORDER — BUDESONIDE-FORMOTEROL FUMARATE 160-4.5 MCG/ACT IN AERO: 2.0000 | INHALATION_SPRAY | Freq: Two times a day (BID) | RESPIRATORY_TRACT | 1 refills | Status: DC

## 2019-08-08 NOTE — Telephone Encounter (Signed)
Last fill 02/02/19  #3/1 Last fill 03/16/19

## 2019-08-08 NOTE — Telephone Encounter (Signed)
rx refill budesonide-formoterol (SYMBICORT) 160-4.5 MCG/ACT inhaler PHARMACY [id number] [address], [place] [phone] (Phone) [phone] (Fax)

## 2019-09-18 ENCOUNTER — Other Ambulatory Visit: Payer: Self-pay | Admitting: Family Medicine

## 2019-09-19 NOTE — Telephone Encounter (Signed)
Last OV 03/16/19 Last fill 03/16/19  #90/1

## 2019-10-13 ENCOUNTER — Encounter: Payer: Self-pay | Admitting: Family Medicine

## 2019-10-30 DIAGNOSIS — Z20828 Contact with and (suspected) exposure to other viral communicable diseases: Secondary | ICD-10-CM | POA: Diagnosis not present

## 2019-11-02 ENCOUNTER — Telehealth: Payer: BC Managed Care – PPO | Admitting: Family Medicine

## 2019-11-09 ENCOUNTER — Telehealth (INDEPENDENT_AMBULATORY_CARE_PROVIDER_SITE_OTHER): Payer: BC Managed Care – PPO | Admitting: Family Medicine

## 2019-11-09 DIAGNOSIS — Z1152 Encounter for screening for COVID-19: Secondary | ICD-10-CM | POA: Diagnosis not present

## 2019-11-09 DIAGNOSIS — I10 Essential (primary) hypertension: Secondary | ICD-10-CM

## 2019-11-09 DIAGNOSIS — E119 Type 2 diabetes mellitus without complications: Secondary | ICD-10-CM | POA: Diagnosis not present

## 2019-11-09 NOTE — Progress Notes (Signed)
Virtual Visit via Video   Due to the COVID-19 pandemic, this visit was completed with telemedicine (audio/video) technology to reduce patient and provider exposure as well as to preserve personal protective equipment.   I connected with Monique Sanchez by a video enabled telemedicine application and verified that I am speaking with the correct person using two identifiers. Location patient: Home Location provider: [facility], Office Persons participating in the virtual visit: Aino D Riess, Mariaeduarda Defranco, MD   I discussed the limitations of evaluation and management by telemedicine and the availability of in person appointments. The patient expressed understanding and agreed to proceed.  Care Team   Patient Care Team: Lucille Passy, MD as PCP - General (Family Medicine) Lucille Passy, MD as Consulting Physician (Family Medicine)  Subjective:   HPI:   Follow up diabetes- see problem based charting.   Review of Systems  Constitutional: Negative for fever and malaise/fatigue.  HENT: Negative for congestion and hearing loss.   Eyes: Negative for blurred vision, discharge and redness.  Respiratory: Negative for cough and shortness of breath.   Cardiovascular: Negative for chest pain, palpitations and leg swelling.  Gastrointestinal: Negative for abdominal pain and heartburn.  Genitourinary: Negative for dysuria.  Musculoskeletal: Negative for falls.  Skin: Negative for rash.  Neurological: Negative for loss of consciousness and headaches.  Endo/Heme/Allergies: Does not bruise/bleed easily.  Psychiatric/Behavioral: Negative for depression.     Patient Active Problem List   Diagnosis Date Noted  . Blockage of coronary artery of heart ([place]) 09/10/2018  . Well woman exam without gynecological exam 03/11/2018  . Diet-controlled diabetes mellitus ([place]) 11/07/2014  . HTN (hypertension) 10/16/2014  . HLD (hyperlipidemia) 10/16/2014  . COPD Grade C 01/31/2014    Social  History   Tobacco Use  . Smoking status: Former Smoker    Packs/day: 0.50    Years: 30.00    Pack years: 15.00    Types: Cigarettes    Quit date: 09/30/2007    Years since quitting: 12.1  . Smokeless tobacco: Never Used  Substance Use Topics  . Alcohol use: Yes    Alcohol/week: 0.0 standard drinks    Comment: occasional wine,none in 24hrs    Current Outpatient Medications:  .  acetaminophen (TYLENOL) 500 MG tablet, Take 500 mg by mouth daily as needed., Disp: , Rfl:  .  budesonide-formoterol (SYMBICORT) 160-4.5 MCG/ACT inhaler, Inhale 2 puffs into the lungs 2 (two) times daily., Disp: 3 Inhaler, Rfl: 1 .  cetirizine (ZYRTEC) 10 MG tablet, Take 10 mg by mouth at bedtime., Disp: , Rfl:  .  glucose blood test strip, Check blood sugar once daily and as directed with onetouch ultra blue test strips. Dx E11.9, Disp: 100 each, Rfl: 0 .  hydrochlorothiazide (HYDRODIURIL) 25 MG tablet, TAKE 1 TABLET DAILY, Disp: 90 tablet, Rfl: 1 .  metoprolol succinate (TOPROL-XL) 25 MG 24 hr tablet, TAKE 1 TABLET DAILY, Disp: 90 tablet, Rfl: 3 .  omeprazole (PRILOSEC) 40 MG capsule, TAKE 1 CAPSULE DAILY, Disp: 90 capsule, Rfl: 3 .  ONETOUCH DELICA LANCETS 99991111 MISC, Check blood sugar once daily and as directed. Dx E11.9, Disp: 100 each, Rfl: 0 .  OVER THE COUNTER MEDICATION, Phason- otc gas relief- prn, Disp: , Rfl:  .  rosuvastatin (CRESTOR) 5 MG tablet, TAKE 1 TABLET DAILY, Disp: 90 tablet, Rfl: 3 .  Spacer/Aero-Holding Chambers (AEROCHAMBER MV) inhaler, Use as instructed, Disp: 1 each, Rfl: 0 .  tiotropium (SPIRIVA) 18 MCG inhalation capsule, Place 1 capsule (  18 mcg total) into inhaler and inhale daily., Disp: 90 capsule, Rfl: 1 .  triamcinolone (KENALOG) 0.025 % cream, Apply 1 application topically 2 (two) times daily., Disp: 15 g, Rfl: 2  Allergies  Allergen Reactions  . Influenza Vaccines     Muscle cramps, shaking  . Trelegy Ellipta [Fluticasone-Umeclidin-Vilant] Hives    Itching in face/neck     Objective:   VITALS: Per patient if applicable, see vitals. GENERAL: Alert, appears well and in no acute distress. HEENT: Atraumatic, conjunctiva clear, no obvious abnormalities on inspection of external nose and ears. NECK: Normal movements of the head and neck. CARDIOPULMONARY: No increased WOB. Speaking in clear sentences. I:E ratio WNL.  MS: Moves all visible extremities without noticeable abnormality. PSYCH: Pleasant and cooperative, well-groomed. Speech normal rate and rhythm. Affect is appropriate. Insight and judgement are appropriate. Attention is focused, linear, and appropriate.  NEURO: CN grossly intact. Oriented as arrived to appointment on time with no prompting. Moves both UE equally.  SKIN: No obvious lesions, wounds, erythema, or cyanosis noted on face or hands.  Depression screen [facility] 2/9 09/13/2018 09/01/2017  Decreased Interest 0 0  Down, Depressed, Hopeless 0 0  PHQ - 2 Score 0 0     . COVID-19 Education: The signs and symptoms of COVID-19 were discussed with the patient and how to seek care for testing if needed. The importance of social distancing was discussed today. . Reviewed expectations re: course of current medical issues. . Discussed self-management of symptoms. . Outlined signs and symptoms indicating need for more acute intervention. . Patient verbalized understanding and all questions were answered. Marland Kitchen Health Maintenance issues including appropriate healthy diet, exercise, and smoking avoidance were discussed with patient. . See orders for this visit as documented in the electronic medical record.  Arnette Norris, MD  Records requested if needed. Time spent: 15 minutes, of which >50% was spent in obtaining information about her symptoms, reviewing her previous labs, evaluations, and treatments, counseling her about her condition (please see the discussed topics above), and developing a plan to further investigate it; she had a number of questions which I  addressed.   Lab Results  Component Value Date   WBC 6.5 02/09/2017   HGB 14.4 02/09/2017   HCT 43.9 02/09/2017   PLT 285.0 02/09/2017   GLUCOSE 112 (H) 03/16/2019   CHOL 169 03/16/2019   TRIG 162.0 (H) 03/16/2019   HDL 64.30 03/16/2019   LDLCALC 72 03/16/2019   ALT 26 03/16/2019   AST 28 03/16/2019   NA 138 03/16/2019   K 3.9 03/16/2019   CL 100 03/16/2019   CREATININE 0.90 03/16/2019   BUN 16 03/16/2019   CO2 29 03/16/2019   TSH 1.84 03/16/2019   INR 0.92 04/18/2014   HGBA1C 6.7 (H) 03/16/2019   MICROALBUR 1.0 03/16/2019    Lab Results  Component Value Date   TSH 1.84 03/16/2019   Lab Results  Component Value Date   WBC 6.5 02/09/2017   HGB 14.4 02/09/2017   HCT 43.9 02/09/2017   MCV 93.3 02/09/2017   PLT 285.0 02/09/2017   Lab Results  Component Value Date   NA 138 03/16/2019   K 3.9 03/16/2019   CO2 29 03/16/2019   GLUCOSE 112 (H) 03/16/2019   BUN 16 03/16/2019   CREATININE 0.90 03/16/2019   BILITOT 0.8 03/16/2019   ALKPHOS 81 03/16/2019   AST 28 03/16/2019   ALT 26 03/16/2019   PROT 6.8 03/16/2019   ALBUMIN 4.4 03/16/2019  CALCIUM 9.2 03/16/2019   ANIONGAP 12 04/18/2014   GFR 62.02 03/16/2019   Lab Results  Component Value Date   CHOL 169 03/16/2019   Lab Results  Component Value Date   HDL 64.30 03/16/2019   Lab Results  Component Value Date   LDLCALC 72 03/16/2019   Lab Results  Component Value Date   TRIG 162.0 (H) 03/16/2019   Lab Results  Component Value Date   CHOLHDL 3 03/16/2019   Lab Results  Component Value Date   HGBA1C 6.7 (H) 03/16/2019       Assessment & Plan:   Problem List Items Addressed This Visit      Active Problems   HTN (hypertension)   Relevant Orders   HgB A1c   Comprehensive metabolic panel   Diet-controlled diabetes mellitus ([place]) - Primary    Diet controlled, diet compliance: compliant most of the time, further diabetic ROS: no polyuria or polydipsia, no chest pain, dyspnea or TIA's, no  numbness, tingling or pain in extremities. Lab Results  Component Value Date   HGBA1C 6.7 (H) 03/16/2019   HGBA1C 5.9 09/13/2018   HGBA1C 6.6 (H) 03/11/2018   Lab Results  Component Value Date   MICROALBUR 1.0 03/16/2019   LDLCALC 72 03/16/2019   CREATININE 0.90 03/16/2019    Health Maintenance 1.  Patient is counseled on appropriate foot care. 2.  BP goal < 130/80. 3.  LDL goal of < 100, HDL > 40 and TG < 150. All diabetic should be on a statin unless contraindication. 4.  Eye Exam yearly and Dental Exam every 6 months. 5.  Dietary recommendations: < 100 g carbohydrates daily. 6.  Physical Activity recommendations: As tolerated aerobic and strength exercises.  7.  Appropriate vaccines reviewed.         Relevant Orders   HgB A1c   Comprehensive metabolic panel      I am having Lotus D. Melson maintain her OVER THE COUNTER MEDICATION, cetirizine, acetaminophen, OneTouch Delica Lancets [id number], glucose blood, AeroChamber MV, triamcinolone, tiotropium, rosuvastatin, metoprolol succinate, omeprazole, budesonide-formoterol, and hydrochlorothiazide.  No orders of the defined types were placed in this encounter.    Arnette Norris, MD

## 2019-11-09 NOTE — Assessment & Plan Note (Signed)
Diet controlled, diet compliance: compliant most of the time, further diabetic ROS: no polyuria or polydipsia, no chest pain, dyspnea or TIA's, no numbness, tingling or pain in extremities. Lab Results  Component Value Date   HGBA1C 6.7 (H) 03/16/2019   HGBA1C 5.9 09/13/2018   HGBA1C 6.6 (H) 03/11/2018   Lab Results  Component Value Date   MICROALBUR 1.0 03/16/2019   LDLCALC 72 03/16/2019   CREATININE 0.90 03/16/2019    Health Maintenance 1.  Patient is counseled on appropriate foot care. 2.  BP goal < 130/80. 3.  LDL goal of < 100, HDL > 40 and TG < 150. All diabetic should be on a statin unless contraindication. 4.  Eye Exam yearly and Dental Exam every 6 months. 5.  Dietary recommendations: < 100 g carbohydrates daily. 6.  Physical Activity recommendations: As tolerated aerobic and strength exercises.  7.  Appropriate vaccines reviewed.

## 2019-11-11 ENCOUNTER — Telehealth: Payer: Self-pay

## 2019-11-11 DIAGNOSIS — Z1152 Encounter for screening for COVID-19: Secondary | ICD-10-CM

## 2019-11-11 DIAGNOSIS — E119 Type 2 diabetes mellitus without complications: Secondary | ICD-10-CM

## 2019-11-11 NOTE — Addendum Note (Signed)
Addended by: Marrion Coy on: 11/11/2019 10:04 AM   Modules accepted: Orders

## 2019-11-11 NOTE — Telephone Encounter (Signed)
Ok sounds good. Thanks.

## 2019-11-11 NOTE — Telephone Encounter (Signed)
MC-Talked with pt and she said she is transferring to you/labs ordered originally by TA will go to you next week/thx dmf

## 2019-11-14 ENCOUNTER — Other Ambulatory Visit (INDEPENDENT_AMBULATORY_CARE_PROVIDER_SITE_OTHER): Payer: BC Managed Care – PPO

## 2019-11-14 ENCOUNTER — Other Ambulatory Visit: Payer: Self-pay

## 2019-11-14 DIAGNOSIS — Z1152 Encounter for screening for COVID-19: Secondary | ICD-10-CM

## 2019-11-14 DIAGNOSIS — E119 Type 2 diabetes mellitus without complications: Secondary | ICD-10-CM

## 2019-11-14 LAB — COMPREHENSIVE METABOLIC PANEL
ALT: 25 U/L (ref 0–35)
AST: 28 U/L (ref 0–37)
Albumin: 4.2 g/dL (ref 3.5–5.2)
Alkaline Phosphatase: 81 U/L (ref 39–117)
BUN: 19 mg/dL (ref 6–23)
CO2: 31 mEq/L (ref 19–32)
Calcium: 9.4 mg/dL (ref 8.4–10.5)
Chloride: 98 mEq/L (ref 96–112)
Creatinine, Ser: 0.89 mg/dL (ref 0.40–1.20)
GFR: 62.7 mL/min (ref >60.00)
Glucose, Bld: 123 mg/dL — ABNORMAL HIGH (ref 70–99)
Potassium: 3.8 mEq/L (ref 3.5–5.1)
Sodium: 138 mEq/L (ref 135–145)
Total Bilirubin: 0.8 mg/dL (ref 0.2–1.2)
Total Protein: 6.7 g/dL (ref 6.0–8.3)

## 2019-11-14 LAB — HEMOGLOBIN A1C: Hgb A1c MFr Bld: 6.8 % — ABNORMAL HIGH (ref 4.6–6.5)

## 2019-11-15 DIAGNOSIS — H524 Presbyopia: Secondary | ICD-10-CM | POA: Diagnosis not present

## 2019-11-15 DIAGNOSIS — H26492 Other secondary cataract, left eye: Secondary | ICD-10-CM | POA: Diagnosis not present

## 2019-11-15 DIAGNOSIS — E119 Type 2 diabetes mellitus without complications: Secondary | ICD-10-CM | POA: Diagnosis not present

## 2019-11-15 LAB — HM DIABETES EYE EXAM

## 2019-11-16 ENCOUNTER — Encounter: Payer: Self-pay | Admitting: Family Medicine

## 2019-11-16 LAB — SAR COV2 SEROLOGY (COVID19)AB(IGG),IA: SARS CoV2 AB IGG: NEGATIVE

## 2019-11-28 HISTORY — PX: OTHER SURGICAL HISTORY: SHX169

## 2019-12-14 DIAGNOSIS — H26492 Other secondary cataract, left eye: Secondary | ICD-10-CM | POA: Diagnosis not present

## 2019-12-26 ENCOUNTER — Ambulatory Visit: Payer: Self-pay

## 2019-12-26 NOTE — Telephone Encounter (Addendum)
Second attempt to reach patient. Left VM to return call to [phone].  Attempted to contact patient. Left VM to return call for her COVID-19 vaccine question. Patient has had allergic reaction to flu vaccine.

## 2019-12-26 NOTE — Telephone Encounter (Signed)
Pt returned call for advice on getting the covid vaccine.  She had previously gotten the flu vaccine and had GBS from it. So she is afraid of getting the covid vaccine but wants to take it. She is advised to speak with her provider and she stated that she did and recommend that she speak with [place]. Per CDC protocol regarding taking the vaccine after having a reaction to the flu vaccine. She is advised to talk to an allergist and if she gets the vaccine to only take the one time vaccine, J&J. She voiced understanding.

## 2019-12-28 ENCOUNTER — Encounter: Payer: Self-pay | Admitting: *Deleted

## 2019-12-28 ENCOUNTER — Telehealth: Payer: Self-pay | Admitting: *Deleted

## 2019-12-28 NOTE — Telephone Encounter (Signed)
Called patient and advised. Patient verbalized understanding and will weight the risk and benefit of receiving the COVID vaccine. This message has also been sent to [facility] in a MyChart message so that she can reference to. Patient verbalized understanding.

## 2019-12-28 NOTE — Telephone Encounter (Signed)
Patient called and stated that her PCP wanted to call an allergist and see if she would need COVID component testing prior to receiving the COVID vaccine. 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 She has not received the Flu vaccine since, she did state that she has received the TDAP vaccine 6 years ago and did not have any issues with that vaccine. She has had Miralax without any issues, she has had triamcinolone ointment before and did not have any issues, and she denies any facial fillers. Please advise.

## 2019-12-28 NOTE — Telephone Encounter (Signed)
Thanks for the question.  Guillan Barr syndrome is a very different type of reaction than having an IgE mediated allergic reaction.   Thus we have no way of testing or telling if she potentially could have a another Guillain-Barr type issue following the vaccine.  Thus the Covid component vaccine testing would not be helpful in this instance.  Now the flu vaccine and other vaccines that she has had before are likely the virus vector type vaccines that is similar to how the [employer] vaccine is produced.  Thus if I was going to recommend a vaccine for her one of the mRNA based vaccines either the [place] or the Moderna might be ideal since she has not had a previous vaccine of this kind before. Based off what you have told me in regards to tolerating MiraLAX and triamcinolone and not having any facial fillers I do not have a reason for her to have a reaction that is IgE allergy mediated to either of the [place] or the [place] vaccines.  She is likely to to do just fine with the [facility] but given that she has a history of delayed type reaction to a previous flu vaccine would recommend she receive something completely different in relationship to vaccine make-up if possible.

## 2020-01-09 ENCOUNTER — Telehealth: Payer: Self-pay | Admitting: General Practice

## 2020-01-09 DIAGNOSIS — J449 Chronic obstructive pulmonary disease, unspecified: Secondary | ICD-10-CM

## 2020-01-09 MED ORDER — BUDESONIDE-FORMOTEROL FUMARATE 160-4.5 MCG/ACT IN AERO: 2.0000 | INHALATION_SPRAY | Freq: Two times a day (BID) | RESPIRATORY_TRACT | 0 refills | Status: DC

## 2020-01-09 NOTE — Telephone Encounter (Signed)
Charlotte please advise, ok to send under your name? Last refill was 08/08/2019.

## 2020-01-09 NOTE — Telephone Encounter (Signed)
Pt notified that RX was sent and needs to establish a TOC.

## 2020-01-09 NOTE — Telephone Encounter (Signed)
sent 

## 2020-01-09 NOTE — Telephone Encounter (Signed)
Patient is calling and requesting a refill for budesonide- formoterol sent to [facility]. Patient is going to call back to schedule a TOC appointment. CB is [phone]

## 2020-01-10 ENCOUNTER — Telehealth: Payer: Self-pay | Admitting: Pulmonary Disease

## 2020-01-10 DIAGNOSIS — Z20828 Contact with and (suspected) exposure to other viral communicable diseases: Secondary | ICD-10-CM | POA: Diagnosis not present

## 2020-01-10 DIAGNOSIS — U071 COVID-19: Secondary | ICD-10-CM | POA: Diagnosis not present

## 2020-01-10 NOTE — Telephone Encounter (Signed)
Spoke with pt, she states she got tested for Covid today at [facility] and it was positive. She states she has an upset stomach and a co-worker at work had tested positive for Covid so they advised her to get tested. She is inquiring about the antibody treatment for Covid. She thinks she was exposed on 12/30/2019. She just got tested out of precaution and never really had symptoms but an upset stomach and a runny nose. She has COPD, diabetes, high blood pressure, and high cholesterol. Would she qualify for the antibodies? Please advise.

## 2020-01-10 NOTE — Telephone Encounter (Signed)
Spoke with patient, she is aware that we will call back with more information as soon as Kenney Houseman reviews her information.

## 2020-01-10 NOTE — Telephone Encounter (Signed)
Patient tested positive for Covid today. Former patient of [facility]. Was reading information she was sent and read to look into antibody treatment. Patient had mild symptoms. Patient's PCP just quit, does not have PCP to go to for antibody treatment. Please advise. [phone]

## 2020-01-10 NOTE — Telephone Encounter (Signed)
She is probably outside the window if she was exposed on the second. Also not typically given if they are asymptomatic. I will forward to [facility]. Advise respiratory clinic if she develops symptoms

## 2020-01-11 ENCOUNTER — Other Ambulatory Visit (HOSPITAL_COMMUNITY): Payer: Self-pay

## 2020-01-11 ENCOUNTER — Other Ambulatory Visit: Payer: Self-pay | Admitting: Nurse Practitioner

## 2020-01-11 ENCOUNTER — Ambulatory Visit (HOSPITAL_COMMUNITY)
Admission: RE | Admit: 2020-01-11 | Discharge: 2020-01-11 | Disposition: A | Discharge status: Home / Self Care | Payer: BC Managed Care – PPO | Source: Ambulatory Visit | Attending: Pulmonary Disease | Admitting: Pulmonary Disease

## 2020-01-11 DIAGNOSIS — U071 COVID-19: Secondary | ICD-10-CM | POA: Diagnosis not present

## 2020-01-11 MED ORDER — SODIUM CHLORIDE 0.9 % IV SOLN
Freq: Once | INTRAVENOUS | Status: DC
  Filled 2020-01-11: qty 20

## 2020-01-11 NOTE — Progress Notes (Signed)
  I connected by phone with Monique Sanchez on 01/11/2020 at 8:17 AM to discuss the potential use of an new treatment for mild to moderate COVID-19 viral infection in non-hospitalized patients.  This patient is a 68 y.o. female that meets the FDA criteria for Emergency Use Authorization of bamlanivimab/etesevimab or casirivimab/imdevimab.  Has a (+) direct SARS-CoV-2 viral test result  Has mild or moderate COVID-19   Is ? 70 years of age and weighs ? 40 kg  Is NOT hospitalized due to COVID-19  Is NOT requiring oxygen therapy or requiring an increase in baseline oxygen flow rate due to COVID-19  Is within 10 days of symptom onset  Has at least one of the high risk factor(s) for progression to severe COVID-19 and/or hospitalization as defined in EUA.  Specific high risk criteria : >/= 70 yo   I have spoken and communicated the following to the patient or parent/caregiver:  1. FDA has authorized the emergency use of bamlanivimab/etesevimab and casirivimab\imdevimab for the treatment of mild to moderate COVID-19 in adults and pediatric patients with positive results of direct SARS-CoV-2 viral testing who are 22 years of age and older weighing at least 40 kg, and who are at high risk for progressing to severe COVID-19 and/or hospitalization.  2. The significant known and potential risks and benefits of bamlanivimab/etesevimab and casirivimab\imdevimab, and the extent to which such potential risks and benefits are unknown.  3. Information on available alternative treatments and the risks and benefits of those alternatives, including clinical trials.  4. Patients treated with bamlanivimab/etesevimab and casirivimab\imdevimab should continue to self-isolate and use infection control measures (e.g., wear mask, isolate, social distance, avoid sharing personal items, clean and disinfect "high touch" surfaces, and frequent handwashing) according to CDC guidelines.   5. The patient or  parent/caregiver has the option to accept or refuse bamlanivimab/etesevimab or casirivimab\imdevimab .  After reviewing this information with the patient, The patient agreed to proceed with receiving the bamlanimivab infusion and will be provided a copy of the Fact sheet prior to receiving the infusion.[name] 01/11/2020 8:17 AM

## 2020-01-11 NOTE — Discharge Instructions (Signed)
10 Things You Can Do to Manage Your COVID-19 Symptoms at Home If you have possible or confirmed COVID-19: 1. Stay home from work and school. And stay away from other public places. If you must go out, avoid using any kind of public transportation, ridesharing, or taxis. 2. Monitor your symptoms carefully. If your symptoms get worse, call your healthcare provider immediately. 3. Get rest and stay hydrated. 4. If you have a medical appointment, call the healthcare provider ahead of time and tell them that you have or may have COVID-19. 5. For medical emergencies, call 911 and notify the dispatch personnel that you have or may have COVID-19. 6. Cover your cough and sneezes with a tissue or use the inside of your elbow. 7. Wash your hands often with soap and water for at least 20 seconds or clean your hands with an alcohol-based hand sanitizer that contains at least 60% alcohol. 8. As much as possible, stay in a specific room and away from other people in your home. Also, you should use a separate bathroom, if available. If you need to be around other people in or outside of the home, wear a mask. 9. Avoid sharing personal items with other people in your household, like dishes, towels, and bedding. 10. Clean all surfaces that are touched often, like counters, tabletops, and doorknobs. Use household cleaning sprays or wipes according to the label instructions. cdc.gov/coronavirus 03/30/2019 This information is not intended to replace advice given to you by your health care provider. Make sure you discuss any questions you have with your health care provider. Document Revised: 09/01/2019 Document Reviewed: 09/01/2019 Elsevier Patient Education  2020 Elsevier Inc.  

## 2020-01-11 NOTE — Progress Notes (Signed)
  Diagnosis: COVID-19  Physician: Dr. Wright Procedure: Covid Infusion Clinic Med: bamlanivimab\etesevimab infusion - Provided patient with bamlanimivab\etesevimab fact sheet for patients, parents and caregivers prior to infusion.  Complications: No immediate complications noted.  Discharge: Discharged home   Adria Costley J 01/11/2020  

## 2020-01-11 NOTE — Telephone Encounter (Signed)
Hey. I got her scheduled for MAB. She states that she is having mild symptoms that started 01/06/20.

## 2020-01-18 ENCOUNTER — Other Ambulatory Visit: Payer: Self-pay

## 2020-01-18 ENCOUNTER — Telehealth: Payer: Self-pay | Admitting: General Practice

## 2020-01-18 DIAGNOSIS — J449 Chronic obstructive pulmonary disease, unspecified: Secondary | ICD-10-CM

## 2020-01-18 MED ORDER — BUDESONIDE-FORMOTEROL FUMARATE 160-4.5 MCG/ACT IN AERO: 2.0000 | INHALATION_SPRAY | Freq: Two times a day (BID) | RESPIRATORY_TRACT | 0 refills | Status: DC

## 2020-01-18 NOTE — Telephone Encounter (Signed)
Pt called and informed me that she needa 90 Day supply of the Symbicort in order for her insurance to pay for it.  I informed pt that we would get that sent into her pharmacy on file.

## 2020-01-18 NOTE — Telephone Encounter (Signed)
Rx sent in for 90 days.

## 2020-01-18 NOTE — Telephone Encounter (Signed)
Patient is calling and requesting a refill for symbicort 90 day supply sent to [facility] in [place]. Patient has a TOC appointment with Dr. Loletha Grayer on 5/6. CB is [phone]

## 2020-02-02 ENCOUNTER — Telehealth (INDEPENDENT_AMBULATORY_CARE_PROVIDER_SITE_OTHER): Payer: BC Managed Care – PPO | Admitting: Family Medicine

## 2020-02-02 ENCOUNTER — Encounter: Payer: Self-pay | Admitting: Family Medicine

## 2020-02-02 VITALS — Temp 97.8°F | Ht 62.0 in

## 2020-02-02 DIAGNOSIS — I10 Essential (primary) hypertension: Secondary | ICD-10-CM

## 2020-02-02 DIAGNOSIS — Z8616 Personal history of COVID-19: Secondary | ICD-10-CM | POA: Diagnosis not present

## 2020-02-02 DIAGNOSIS — J449 Chronic obstructive pulmonary disease, unspecified: Secondary | ICD-10-CM

## 2020-02-02 MED ORDER — TIOTROPIUM BROMIDE MONOHYDRATE 18 MCG IN CAPS: 18.0000 ug | ORAL_CAPSULE | Freq: Every day | RESPIRATORY_TRACT | 3 refills | Status: DC

## 2020-02-02 MED ORDER — HYDROCHLOROTHIAZIDE 25 MG PO TABS: 25.0000 mg | ORAL_TABLET | Freq: Every day | ORAL | 3 refills | Status: DC

## 2020-02-02 MED ORDER — OMEPRAZOLE 40 MG PO CPDR: DELAYED_RELEASE_CAPSULE | ORAL | 3 refills | Status: DC

## 2020-02-02 NOTE — Progress Notes (Signed)
Virtual Visit via Video Note  I connected with Monique Sanchez on 02/02/20 at  8:00 AM EDT by a video enabled telemedicine application and verified that I am speaking with the correct person using two identifiers. Location patient: home Location provider: work Persons participating in the virtual visit: patient, provider  I discussed the limitations of evaluation and management by telemedicine and the availability of in person appointments. The patient expressed understanding and agreed to proceed.  Chief Complaint  Patient presents with  . Establish Care    Pt here for [facility] visit     HPI: Monique Sanchez is a 69 y.o. female seen for a TOC appt, previous PCP Dr. Deborra Medina, and f/u for positive COVID test. Symptoms began 01/06/20,tested positive on 01/10/20, and she received outpatient monoclonal antibody infusion. She states symptoms have resolved. 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   She needs refills of her HCTZ 25mg  daily, omeprazole 40mg  daily, and spiriva inhaler. She state she does not need refills of her other meds at this time.    Past Medical History:  Diagnosis Date  . Arthritis   . Asthma   . Blockage of coronary artery of heart (HCC)    50% blockage single vessels  . Claustrophobia   . COPD (chronic obstructive pulmonary disease) ([place])   . Diverticular disease of colon 2010  . GERD (gastroesophageal reflux disease)   . Hypercholesteremia   . Hypertension   . Osteoarthritis   . Pre-diabetes   . Seasonal allergies     Past Surgical History:  Procedure Laterality Date  . ABLATION ON ENDOMETRIOSIS  1976  . ANTERIOR CERVICAL DECOMP/DISCECTOMY FUSION  04/20/2014   Procedure: ANTERIOR CERVICAL DECOMPRESSION/DISCECTOMY FUSION 2 LEVELS;  Surgeon: Sinclair Ship, MD;  Location: [place];  Service: Orthopedics;;  Anterior cervical decompression fusion, cervical 4-5, cervical 5-6 with instrumentation and allograft,  possible C4 corpectomy.  Marland Kitchen CARDIAC CATHETERIZATION  2010   [facility]; Dr. Josefa Half  . CATARACT EXTRACTION W/ INTRAOCULAR LENS IMPLANT Left 05/2014  . 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  . CHOLECYSTECTOMY  2011  . COLON RESECTION     for diverticulitis  . COLON SURGERY Left 2010  . COLONOSCOPY WITH PROPOFOL N/A 07/08/2017   Procedure: COLONOSCOPY WITH PROPOFOL;  Surgeon: Manya Silvas, MD;  Location: [facility] ENDOSCOPY;  Service: Endoscopy;  Laterality: N/A;  . FOOT SURGERY  1995  . laser capsulotomy  11/2019    Family History  Problem Relation Age of Onset  . Heart disease Mother   . Cancer Mother        lung metastatic site  . Heart disease Father   . Cancer Brother        lung  . Hyperlipidemia Brother   . Hypertension Brother   . Breast cancer Paternal Grandmother     Social History   Tobacco Use  . Smoking status: Former Smoker    Packs/day: 0.50    Years: 30.00    Pack years: 15.00    Types: Cigarettes    Quit date: 09/30/2007    Years since quitting: 12.3  . Smokeless tobacco: Never Used  Substance Use Topics  . Alcohol use: Yes    Alcohol/week: 0.0 standard drinks    Comment: occasional wine,none in 24hrs  . Drug use: No     Current Outpatient Medications:  .  acetaminophen (TYLENOL) 500 MG tablet, Take 500 mg by mouth daily as needed., Disp: , Rfl:  .  azelastine (ASTELIN) 0.1 % nasal spray, Place 2 sprays into both nostrils 2 (two) times daily., Disp: , Rfl:  .  budesonide-formoterol (SYMBICORT) 160-4.5 MCG/ACT inhaler, Inhale 2 puffs into the lungs 2 (two) times daily. Need to establish with new pcp, Disp: 3 Inhaler, Rfl: 0 .  cetirizine (ZYRTEC) 10 MG tablet, Take 10 mg by mouth at bedtime., Disp: , Rfl:  .  glucose blood test strip, Check blood sugar once daily and as directed with onetouch ultra blue test strips. Dx E11.9, Disp: 100 each, Rfl: 0 .   hydrochlorothiazide (HYDRODIURIL) 25 MG tablet, Take 1 tablet (25 mg total) by mouth daily., Disp: 90 tablet, Rfl: 3 .  metoprolol succinate (TOPROL-XL) 25 MG 24 hr tablet, TAKE 1 TABLET DAILY, Disp: 90 tablet, Rfl: 3 .  omeprazole (PRILOSEC) 40 MG capsule, TAKE 1 CAPSULE DAILY, Disp: 90 capsule, Rfl: 3 .  ONETOUCH DELICA LANCETS [id number] MISC, Check blood sugar once daily and as directed. Dx E11.9, Disp: 100 each, Rfl: 0 .  OVER THE COUNTER MEDICATION, Phason- otc gas relief- prn, Disp: , Rfl:  .  rosuvastatin (CRESTOR) 5 MG tablet, TAKE 1 TABLET DAILY, Disp: 90 tablet, Rfl: 3 .  Spacer/Aero-Holding Chambers (AEROCHAMBER MV) inhaler, Use as instructed, Disp: 1 each, Rfl: 0 .  tiotropium (SPIRIVA) 18 MCG inhalation capsule, Place 1 capsule (18 mcg total) into inhaler and inhale daily., Disp: 90 capsule, Rfl: 3 .  triamcinolone (KENALOG) 0.025 % cream, Apply 1 application topically 2 (two) times daily., Disp: 15 g, Rfl: 2  Allergies  Allergen Reactions  . Influenza Vaccines     Muscle cramps, shaking  . Trelegy Ellipta [Fluticasone-Umeclidin-Vilant] Hives    Itching in face/neck      ROS: See pertinent positives and negatives per HPI.   EXAM:  VITALS per patient if applicable: Temp 0000000 F (36.6 C) (Oral)   Ht 5\' 2"  (1.575 m)   BMI 25.83 kg/m   BP Readings from Last 3 Encounters:  01/11/20 (!) 144/54  03/16/19 130/64  09/13/18 110/72     GENERAL: alert, oriented, appears well and in no acute distress  HEENT: atraumatic, conjunctiva clear, no obvious abnormalities on inspection of external nose and ears  NECK: normal movements of the head and neck  LUNGS: on inspection no signs of respiratory distress, breathing rate appears normal, no obvious gross SOB, gasping or wheezing, no conversational dyspnea  CV: no obvious cyanosis  PSYCH/NEURO: pleasant and cooperative, no obvious depression or anxiety, speech and thought processing grossly intact   ASSESSMENT AND PLAN: 1.  Personal history of covid-19 - symptoms started 4/9, positive test on 4/13, received antibody infusion - symptoms resolved  2. Essential hypertension Refill: - hydrochlorothiazide (HYDRODIURIL) 25 MG tablet; Take 1 tablet (25 mg total) by mouth daily.  Dispense: 90 tablet; Refill: 3  3. COPD Grade C - stable Refill: - tiotropium (SPIRIVA) 18 MCG inhalation capsule; Place 1 capsule (18 mcg total) into inhaler and inhale daily.  Dispense: 90 capsule; Refill: 3 - cont all daily respir meds  F/u in 2 mo for labs and DM, HTN f/u     I discussed the assessment and treatment plan with the patient. The patient was provided an opportunity to ask questions and all were answered. The patient agreed with the plan and demonstrated an understanding of the instructions.   The patient was advised to call back or seek  an in-person evaluation if the symptoms worsen or if the condition fails to improve as anticipated.   Letta Median, DO

## 2020-02-07 ENCOUNTER — Other Ambulatory Visit: Payer: Self-pay

## 2020-02-07 DIAGNOSIS — I10 Essential (primary) hypertension: Secondary | ICD-10-CM

## 2020-02-07 MED ORDER — HYDROCHLOROTHIAZIDE 25 MG PO TABS: 25.0000 mg | ORAL_TABLET | Freq: Every day | ORAL | 3 refills | Status: DC

## 2020-02-07 NOTE — Telephone Encounter (Signed)
Last vv 02/02/20 Last fill  02/02/20  #90/3

## 2020-05-25 ENCOUNTER — Telehealth: Payer: Self-pay | Admitting: Family Medicine

## 2020-05-25 DIAGNOSIS — E119 Type 2 diabetes mellitus without complications: Secondary | ICD-10-CM

## 2020-05-25 DIAGNOSIS — E782 Mixed hyperlipidemia: Secondary | ICD-10-CM

## 2020-05-25 MED ORDER — ROSUVASTATIN CALCIUM 5 MG PO TABS: 5.0000 mg | ORAL_TABLET | Freq: Every day | ORAL | 3 refills | Status: DC

## 2020-05-25 NOTE — Telephone Encounter (Signed)
Dr C please advise.  Pt is asking for refills on:  Rosuvastatin 90 tabs    3-refills Last fill-04/25/2019 Last vv-02/02/2020

## 2020-05-25 NOTE — Telephone Encounter (Signed)
Patient is calling and requesting an A1C check and also a refill for rosuvastatin sent to [facility], please advise. CB is [phone]

## 2020-06-07 ENCOUNTER — Telehealth: Payer: Self-pay

## 2020-06-07 DIAGNOSIS — I10 Essential (primary) hypertension: Secondary | ICD-10-CM

## 2020-06-07 MED ORDER — METOPROLOL SUCCINATE ER 25 MG PO TB24: 25.0000 mg | ORAL_TABLET | Freq: Every day | ORAL | 3 refills | Status: DC

## 2020-06-07 NOTE — Telephone Encounter (Signed)
Rx refilled.

## 2020-06-07 NOTE — Telephone Encounter (Signed)
Patient notified [place] phone. No questions.

## 2020-06-07 NOTE — Telephone Encounter (Signed)
Received a refill request for :   Metoprolol Suc Tab 25 mg ER  LOV 02/02/20 LR 04/25/19 (dr Deborra Medina) Otho Darner #90, with 3 rf's  No future OV's scheduled   please advise.  thanks

## 2020-08-30 ENCOUNTER — Ambulatory Visit: Payer: BC Managed Care – PPO | Admitting: Family Medicine

## 2020-08-30 ENCOUNTER — Encounter: Payer: Self-pay | Admitting: Family Medicine

## 2020-08-30 ENCOUNTER — Other Ambulatory Visit: Payer: Self-pay

## 2020-08-30 VITALS — BP 138/70 | HR 53 | Temp 96.9°F | Ht 62.0 in | Wt 134.8 lb

## 2020-08-30 DIAGNOSIS — I10 Essential (primary) hypertension: Secondary | ICD-10-CM

## 2020-08-30 DIAGNOSIS — E785 Hyperlipidemia, unspecified: Secondary | ICD-10-CM

## 2020-08-30 DIAGNOSIS — J449 Chronic obstructive pulmonary disease, unspecified: Secondary | ICD-10-CM

## 2020-08-30 DIAGNOSIS — E119 Type 2 diabetes mellitus without complications: Secondary | ICD-10-CM

## 2020-08-30 DIAGNOSIS — E782 Mixed hyperlipidemia: Secondary | ICD-10-CM

## 2020-08-30 LAB — CBC
HCT: 43.5 % (ref 36.0–46.0)
Hemoglobin: 14.4 g/dL (ref 12.0–15.0)
MCHC: 33.2 g/dL (ref 30.0–36.0)
MCV: 93.6 fl (ref 78.0–100.0)
Platelets: 253 10*3/uL (ref 150.0–400.0)
RBC: 4.65 Mil/uL (ref 3.87–5.11)
RDW: 12.3 % (ref 11.5–15.5)
WBC: 7.2 10*3/uL (ref 4.0–10.5)

## 2020-08-30 LAB — COMPREHENSIVE METABOLIC PANEL
ALT: 26 U/L (ref 0–35)
AST: 26 U/L (ref 0–37)
Albumin: 4.4 g/dL (ref 3.5–5.2)
Alkaline Phosphatase: 73 U/L (ref 39–117)
BUN: 22 mg/dL (ref 6–23)
CO2: 31 mEq/L (ref 19–32)
Calcium: 9.2 mg/dL (ref 8.4–10.5)
Chloride: 100 mEq/L (ref 96–112)
Creatinine, Ser: 0.87 mg/dL (ref 0.40–1.20)
GFR: 67.33 mL/min (ref >60.00)
Glucose, Bld: 122 mg/dL — ABNORMAL HIGH (ref 70–99)
Potassium: 4.1 mEq/L (ref 3.5–5.1)
Sodium: 139 mEq/L (ref 135–145)
Total Bilirubin: 0.9 mg/dL (ref 0.2–1.2)
Total Protein: 6.6 g/dL (ref 6.0–8.3)

## 2020-08-30 LAB — LIPID PANEL
Cholesterol: 194 mg/dL (ref 0–200)
HDL: 60.5 mg/dL (ref >39.00)
LDL Cholesterol: 108 mg/dL — ABNORMAL HIGH (ref 0–99)
NonHDL: 133.67
Total CHOL/HDL Ratio: 3
Triglycerides: 128 mg/dL (ref 0.0–149.0)
VLDL: 25.6 mg/dL (ref 0.0–40.0)

## 2020-08-30 LAB — POCT GLYCOSYLATED HEMOGLOBIN (HGB A1C): Hemoglobin A1C: 6.2 % — AB (ref 4.0–5.6)

## 2020-08-30 LAB — MICROALBUMIN / CREATININE URINE RATIO
Creatinine,U: 84.8 mg/dL
Microalb Creat Ratio: 2.3 mg/g (ref 0.0–30.0)
Microalb, Ur: 2 mg/dL — ABNORMAL HIGH (ref 0.0–1.9)

## 2020-08-30 MED ORDER — ROSUVASTATIN CALCIUM 10 MG PO TABS: 10.0000 mg | ORAL_TABLET | Freq: Every day | ORAL | 3 refills | Status: DC

## 2020-08-30 NOTE — Progress Notes (Signed)
Subjective:     Monique Sanchez is a 69 y.o. female presenting for Establish Care (no concerns )     HPI   #Diabetes Currently taking no medications - diet controlled   Last HgbA1c:  Lab Results  Component Value Date   HGBA1C 6.2 (A) 08/30/2020    Diabetes Health Maintenance Due:    Diabetes Health Maintenance Due  Topic Date Due  . OPHTHALMOLOGY EXAM  11/14/2020  . HEMOGLOBIN A1C  02/28/2021  . FOOT EXAM  08/30/2021  . URINE MICROALBUMIN  08/30/2021       Review of Systems   Social History   Tobacco Use  Smoking Status Former Smoker  . Packs/day: 0.50  . Years: 30.00  . Pack years: 15.00  . Types: Cigarettes  . Quit date: 09/30/2007  . Years since quitting: 12.9  Smokeless Tobacco Never Used        Objective:    BP Readings from Last 3 Encounters:  08/30/20 138/70  01/11/20 (!) 144/54  03/16/19 130/64   Wt Readings from Last 3 Encounters:  08/30/20 134 lb 12 oz (61.1 kg)  03/16/19 141 lb 3.2 oz (64 kg)  09/13/18 139 lb 3.2 oz (63.1 kg)    BP 138/70   Pulse (!) 53   Temp (!) 96.9 F (36.1 C) (Temporal)   Ht 5\' 2"  (1.575 m)   Wt 134 lb 12 oz (61.1 kg)   SpO2 92%   BMI 24.65 kg/m    Physical Exam Constitutional:      General: She is not in acute distress.    Appearance: She is well-developed. She is not diaphoretic.  HENT:     Right Ear: External ear normal.     Left Ear: External ear normal.     Nose: Nose normal.  Eyes:     Conjunctiva/sclera: Conjunctivae normal.  Cardiovascular:     Rate and Rhythm: Normal rate and regular rhythm.     Heart sounds: No murmur heard.   Pulmonary:     Effort: Pulmonary effort is normal. No respiratory distress.     Breath sounds: Normal breath sounds. No wheezing.  Musculoskeletal:     Cervical back: Neck supple.  Skin:    General: Skin is warm and dry.     Capillary Refill: Capillary refill takes less than 2 seconds.  Neurological:     Mental Status: She is alert. Mental status is at  baseline.  Psychiatric:        Mood and Affect: Mood normal.        Behavior: Behavior normal.     Diabetic Foot Exam - Simple   Simple Foot Form Diabetic Foot exam was performed with the following findings: Yes 08/30/2020 10:21 AM  Visual Inspection No deformities, no ulcerations, no other skin breakdown bilaterally: Yes Sensation Testing Intact to touch and monofilament testing bilaterally: Yes Pulse Check Posterior Tibialis and Dorsalis pulse intact bilaterally: Yes Comments         Assessment & Plan:   Problem List Items Addressed This Visit      Cardiovascular and Mediastinum   HTN (hypertension)    Well controlled. Cont metoprolol 25 mg, HCTZ 25 mg      Relevant Medications   rosuvastatin (CRESTOR) 10 MG tablet   Other Relevant Orders   Comprehensive metabolic panel (Completed)   CBC (Completed)     Respiratory   COPD Grade C    Has a pulmonologist but has been stable. Using symbicort and spiriva -  continue      Relevant Orders   CBC (Completed)     Endocrine   Diet-controlled diabetes mellitus ([place]) - Primary    Continues to be well controlled. Cont dietary control. Normal foot exam today      Relevant Medications   rosuvastatin (CRESTOR) 10 MG tablet   Other Relevant Orders   POCT glycosylated hemoglobin (Hb A1C) (Completed)   Microalbumin / creatinine urine ratio (Completed)     Other   HLD (hyperlipidemia)    LDL >100. Will advise increased Crestor to 10 mg      Relevant Medications   rosuvastatin (CRESTOR) 10 MG tablet   Other Relevant Orders   Lipid panel (Completed)       Return in about 1 year (around 08/30/2021) for annual physical.  Lesleigh Noe, MD  This visit occurred during the SARS-CoV-2 public health emergency.  Safety protocols were in place, including screening questions prior to the visit, additional usage of staff PPE, and extensive cleaning of exam room while observing appropriate contact time as indicated for  disinfecting solutions.

## 2020-08-30 NOTE — Assessment & Plan Note (Addendum)
Has a pulmonologist but has been stable. Using symbicort and spiriva - continue

## 2020-08-30 NOTE — Assessment & Plan Note (Signed)
Well controlled. Cont metoprolol 25 mg, HCTZ 25 mg

## 2020-08-30 NOTE — Patient Instructions (Signed)
Please call the location of your choice from the menu below to schedule your Mammogram and/or Bone Density appointment.    [place]   1. [facility]                      Phone:  [phone] [address], [place] [address]                                                             Services: Traditional and 3D Mammogram, Bone Density   2. [facility]                 Phone: [phone] [address], [place] [address]    Service: Bone Density ONLY   *this site does NOT perform mammograms  3. [place]                        Phone:  [phone] [address] [place], [place] [address]                                            Services:  3D Mammogram and Bone Density    [place]  1. [place] [facility]   Phone:  [phone]   [place], [place] [address]                                            Services: 3D Mammogram and Bone Density  2. [place] [facility])  Phone:  [phone]   [address] [place], [place] [address]  Services:  3D Mammogram and Bone Density  

## 2020-08-30 NOTE — Assessment & Plan Note (Signed)
Continues to be well controlled. Cont dietary control. Normal foot exam today

## 2020-08-30 NOTE — Assessment & Plan Note (Addendum)
LDL >100. Will advise increased Crestor to 10 mg

## 2020-11-13 ENCOUNTER — Other Ambulatory Visit: Payer: Self-pay | Admitting: Family Medicine

## 2020-11-13 ENCOUNTER — Encounter: Payer: Self-pay | Admitting: Family Medicine

## 2020-11-13 DIAGNOSIS — Z1231 Encounter for screening mammogram for malignant neoplasm of breast: Secondary | ICD-10-CM

## 2020-11-13 DIAGNOSIS — E2839 Other primary ovarian failure: Secondary | ICD-10-CM

## 2020-12-06 ENCOUNTER — Other Ambulatory Visit: Payer: Self-pay

## 2020-12-06 ENCOUNTER — Ambulatory Visit
Admission: RE | Admit: 2020-12-06 | Discharge: 2020-12-06 | Disposition: A | Discharge status: Home / Self Care | Payer: Medicare Other | Source: Ambulatory Visit | Attending: Family Medicine | Admitting: Family Medicine

## 2020-12-06 DIAGNOSIS — Z1231 Encounter for screening mammogram for malignant neoplasm of breast: Secondary | ICD-10-CM | POA: Diagnosis not present

## 2020-12-06 DIAGNOSIS — M810 Age-related osteoporosis without current pathological fracture: Secondary | ICD-10-CM | POA: Diagnosis not present

## 2020-12-06 DIAGNOSIS — E2839 Other primary ovarian failure: Secondary | ICD-10-CM | POA: Insufficient documentation

## 2020-12-14 ENCOUNTER — Encounter: Payer: Self-pay | Admitting: Family Medicine

## 2020-12-14 DIAGNOSIS — J449 Chronic obstructive pulmonary disease, unspecified: Secondary | ICD-10-CM

## 2020-12-14 DIAGNOSIS — I10 Essential (primary) hypertension: Secondary | ICD-10-CM

## 2020-12-14 MED ORDER — HYDROCHLOROTHIAZIDE 25 MG PO TABS: 25.0000 mg | ORAL_TABLET | Freq: Every day | ORAL | 2 refills | Status: DC

## 2020-12-14 MED ORDER — BUDESONIDE-FORMOTEROL FUMARATE 160-4.5 MCG/ACT IN AERO: 2.0000 | INHALATION_SPRAY | Freq: Two times a day (BID) | RESPIRATORY_TRACT | 2 refills | Status: DC

## 2020-12-14 MED ORDER — METOPROLOL SUCCINATE ER 25 MG PO TB24: 25.0000 mg | ORAL_TABLET | Freq: Every day | ORAL | 2 refills | Status: DC

## 2021-01-31 ENCOUNTER — Encounter: Payer: Self-pay | Admitting: Family Medicine

## 2021-01-31 ENCOUNTER — Other Ambulatory Visit: Payer: Self-pay

## 2021-01-31 MED ORDER — OMEPRAZOLE 40 MG PO CPDR: DELAYED_RELEASE_CAPSULE | ORAL | 3 refills | Status: DC

## 2021-03-10 DIAGNOSIS — R059 Cough, unspecified: Secondary | ICD-10-CM | POA: Diagnosis not present

## 2021-03-10 DIAGNOSIS — J441 Chronic obstructive pulmonary disease with (acute) exacerbation: Secondary | ICD-10-CM | POA: Diagnosis not present

## 2021-05-01 ENCOUNTER — Encounter: Payer: Self-pay | Admitting: Family Medicine

## 2021-05-01 ENCOUNTER — Other Ambulatory Visit: Payer: Self-pay

## 2021-05-01 ENCOUNTER — Ambulatory Visit (INDEPENDENT_AMBULATORY_CARE_PROVIDER_SITE_OTHER): Payer: Medicare Other | Admitting: Family Medicine

## 2021-05-01 VITALS — BP 118/64 | HR 71 | Temp 97.9°F | Ht 62.0 in | Wt 135.0 lb

## 2021-05-01 DIAGNOSIS — E785 Hyperlipidemia, unspecified: Secondary | ICD-10-CM

## 2021-05-01 DIAGNOSIS — I240 Acute coronary thrombosis not resulting in myocardial infarction: Secondary | ICD-10-CM | POA: Diagnosis not present

## 2021-05-01 DIAGNOSIS — E119 Type 2 diabetes mellitus without complications: Secondary | ICD-10-CM | POA: Diagnosis not present

## 2021-05-01 DIAGNOSIS — I10 Essential (primary) hypertension: Secondary | ICD-10-CM | POA: Diagnosis not present

## 2021-05-01 DIAGNOSIS — D126 Benign neoplasm of colon, unspecified: Secondary | ICD-10-CM | POA: Diagnosis not present

## 2021-05-01 LAB — LIPID PANEL
Cholesterol: 159 mg/dL (ref 0–200)
HDL: 55.1 mg/dL (ref >39.00)
LDL Cholesterol: 74 mg/dL (ref 0–99)
NonHDL: 103.82
Total CHOL/HDL Ratio: 3
Triglycerides: 148 mg/dL (ref 0.0–149.0)
VLDL: 29.6 mg/dL (ref 0.0–40.0)

## 2021-05-01 LAB — MICROALBUMIN / CREATININE URINE RATIO
Creatinine,U: 46.8 mg/dL
Microalb Creat Ratio: 1.5 mg/g (ref 0.0–30.0)
Microalb, Ur: <0.7 mg/dL (ref 0.0–1.9)

## 2021-05-01 LAB — HEMOGLOBIN A1C: Hgb A1c MFr Bld: 6.8 % — ABNORMAL HIGH (ref 4.6–6.5)

## 2021-05-01 NOTE — Patient Instructions (Addendum)
#  Cholesterol medication  - Consider trying this in the morning with food - or at dinner time - will check labs today  #labs today  #Referral I have placed a referral to a specialist for you. You should receive a phone call from the specialty office. Make sure your voicemail is not full and that if you are able to answer your phone to unknown or new numbers.   It may take up to 2 weeks to hear about the referral. If you do not hear anything in 2 weeks, please call our office and ask to speak with the referral coordinator.

## 2021-05-01 NOTE — Assessment & Plan Note (Signed)
Some side effects with higher dose of crestor. Advised trying with food. Check lipids goal LDL <70. Discussed benefit of goal and if symptoms persisting will consider risk/benefit of continued higher dose. Cont crestor 10 mg

## 2021-05-01 NOTE — Assessment & Plan Note (Signed)
Overdue for repeat colonoscopy. Referral provided

## 2021-05-01 NOTE — Progress Notes (Signed)
Subjective:     Monique Sanchez is a 70 y.o. female presenting for Follow-up (6 month )     HPI  #HLD - crestor is upsetting her stomach at the higher dose - has tried other medications for cholesterol and had side effects - typical takes at night   #HTN - taking medication w/o issue - taking hctz 25 mg and metoprolol succinate 25 mg - had a heart cath with one artery that was 50% blocked  - no cp, sob, ha, vision changes  #microalbuminuria - takes tylenol for pain - drinking more water   Review of Systems   Social History   Tobacco Use  Smoking Status Former   Packs/day: 0.50   Years: 30.00   Pack years: 15.00   Types: Cigarettes   Quit date: 09/30/2007   Years since quitting: 13.5  Smokeless Tobacco Never        Objective:    BP Readings from Last 3 Encounters:  05/01/21 118/64  08/30/20 138/70  01/11/20 (!) 144/54   Wt Readings from Last 3 Encounters:  05/01/21 135 lb (61.2 kg)  08/30/20 134 lb 12 oz (61.1 kg)  03/16/19 141 lb 3.2 oz (64 kg)    BP 118/64   Pulse 71   Temp 97.9 F (36.6 C) (Temporal)   Ht '5\' 2"'$  (1.575 m)   Wt 135 lb (61.2 kg)   SpO2 98%   BMI 24.69 kg/m    Physical Exam Constitutional:      General: She is not in acute distress.    Appearance: She is well-developed. She is not diaphoretic.  HENT:     Right Ear: External ear normal.     Left Ear: External ear normal.  Eyes:     Conjunctiva/sclera: Conjunctivae normal.  Cardiovascular:     Rate and Rhythm: Normal rate and regular rhythm.  Pulmonary:     Effort: Pulmonary effort is normal. No respiratory distress.     Breath sounds: Normal breath sounds. No wheezing.  Musculoskeletal:     Cervical back: Neck supple.  Skin:    General: Skin is warm and dry.     Capillary Refill: Capillary refill takes less than 2 seconds.  Neurological:     Mental Status: She is alert. Mental status is at baseline.  Psychiatric:        Mood and Affect: Mood normal.         Behavior: Behavior normal.          Assessment & Plan:   Problem List Items Addressed This Visit       Cardiovascular and Mediastinum   HTN (hypertension)    BP at goal. Cont hctz 25 mg and metoprolol 25 mg. Discussed if protein still present in urine would start lisinopril. 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 Denies cp, sob or change in symptoms. Discussed cardiology referral to consider additional studies if indicated to assess progression. Cont creastor 10 mg.        Relevant Orders   Ambulatory referral to Cardiology   Lipid panel     Digestive   Tubular adenoma of colon    Overdue for repeat colonoscopy. Referral provided       Relevant Orders   Ambulatory  referral to Gastroenterology     Endocrine   Diet-controlled diabetes mellitus ([place])    Encouraged healthy diet/exercise. Repeat urine today to check for protein.        Relevant Orders   Microalbumin / creatinine urine ratio   Hemoglobin A1c     Other   HLD (hyperlipidemia)    Some side effects with higher dose of crestor. Advised trying with food. Check lipids goal LDL <70. Discussed benefit of goal and if symptoms persisting will consider risk/benefit of continued higher dose. Cont crestor 10 mg       Relevant Orders   Lipid panel     Return in about 1 year (around 05/01/2022).  Lesleigh Noe, MD  This visit occurred during the SARS-CoV-2 public health emergency.  Safety protocols were in place, including screening questions prior to the visit, additional usage of staff PPE, and extensive cleaning of exam room while observing appropriate contact time as indicated for disinfecting solutions.

## 2021-05-01 NOTE — Assessment & Plan Note (Signed)
Pt reports 10 years since evaluation. Denies cp, sob or change in symptoms. Discussed cardiology referral to consider additional studies if indicated to assess progression. Cont creastor 10 mg.

## 2021-05-01 NOTE — Assessment & Plan Note (Signed)
Encouraged healthy diet/exercise. Repeat urine today to check for protein.

## 2021-05-01 NOTE — Assessment & Plan Note (Signed)
BP at goal. Cont hctz 25 mg and metoprolol 25 mg. Discussed if protein still present in urine would start lisinopril. As no HF and no hx of MI may consider stopping metoprolol

## 2021-05-02 ENCOUNTER — Encounter: Payer: Self-pay | Admitting: Family Medicine

## 2021-06-10 DIAGNOSIS — H5203 Hypermetropia, bilateral: Secondary | ICD-10-CM | POA: Diagnosis not present

## 2021-06-10 LAB — HM DIABETES EYE EXAM

## 2021-06-26 ENCOUNTER — Encounter: Payer: Self-pay | Admitting: Family Medicine

## 2021-06-26 DIAGNOSIS — J449 Chronic obstructive pulmonary disease, unspecified: Secondary | ICD-10-CM

## 2021-06-27 MED ORDER — TIOTROPIUM BROMIDE MONOHYDRATE 18 MCG IN CAPS: 18.0000 ug | ORAL_CAPSULE | Freq: Every day | RESPIRATORY_TRACT | 3 refills | Status: DC

## 2021-06-27 NOTE — Telephone Encounter (Signed)
Last OV: 05/01/21  Last refill 02/02/20 #90 with 3 by other provider

## 2021-07-03 ENCOUNTER — Other Ambulatory Visit: Payer: Self-pay

## 2021-07-03 ENCOUNTER — Encounter: Payer: Self-pay | Admitting: Emergency Medicine

## 2021-07-03 ENCOUNTER — Ambulatory Visit: Payer: Medicare Other | Admitting: Emergency Medicine

## 2021-07-03 DIAGNOSIS — J449 Chronic obstructive pulmonary disease, unspecified: Secondary | ICD-10-CM | POA: Diagnosis not present

## 2021-07-03 DIAGNOSIS — J301 Allergic rhinitis due to pollen: Secondary | ICD-10-CM

## 2021-07-03 MED ORDER — BREZTRI AEROSPHERE 160-9-4.8 MCG/ACT IN AERO: 2.0000 | INHALATION_SPRAY | Freq: Two times a day (BID) | RESPIRATORY_TRACT | 0 refills | Status: DC

## 2021-07-03 NOTE — Assessment & Plan Note (Signed)
Overall doing fairly well with good exertional tolerance.  She did have a flare in the summer 2022 after allergic exposure.  Now back to baseline.  She is on Symbicort and Spiriva, interested in possibly trying Breztri.  We will do a trial and see if she benefits.  If so then we can continue it going forward.  Temporarily hold your Spiriva and Symbicort. We will do a trial of Breztri 2 puffs twice a day.  You can use this medication with a spacer.  Rinse and gargle after using.  If you prefer this medication then please call and let us know so we can send a prescription to your pharmacy. Keep albuterol available to use 2 puffs if needed for shortness of breath, chest tightness, wheezing.. Follow with Dr. Lamonte Sakai in 12 months or sooner if you have any problems.

## 2021-07-03 NOTE — Progress Notes (Signed)
Subjective:    Patient ID: Monique Sanchez, female    DOB: 22-Feb-1951, 70 y.o.   MRN: [id number]  HPI 70 year old former smoker (15-20 pack years) with a history of CAD, hypertension, hyperlipidemia, prediabetes, seasonal allergies.  She is been followed in our office by Dr. Lake Bells for COPD/asthma.  She is here to reestablish care. She reports that she has baseline exertional SOB with vacuuming, housework. She can walk at slower pace, is able to shop. No real cough. She does have a lot nasal congestion and clear drainage esp in the am. She has astelin but uses rarely. On zyrtec. She is on symbicort and spiriva, has tried trelegy before but she had a rash from it. Infrequent flares. She did have a flare in June after allergies flared and dog exposure. She uses albuterol very rarely.  She has not had the COVID vaccines, had GBS with the flu shot and has been cautioned against it.   Spirometry 01/31/2014 reviewed by me, shows very severe obstruction with an FEV1 of 0.5 L (24% predicted).  Review of Systems As per HPI  Past Medical History:  Diagnosis Date   Arthritis    Asthma    Blockage of coronary artery of heart (HCC)    50% blockage single vessels   Claustrophobia    COPD (chronic obstructive pulmonary disease) (HCC)    Diverticular disease of colon 2010   GERD (gastroesophageal reflux disease)    Hypercholesteremia    Hypertension    Osteoarthritis    Pre-diabetes    Seasonal allergies      Family History  Problem Relation Age of Onset   Heart disease Mother    Cancer Mother        mets to lung, primary unclear   Heart disease Father    Lung cancer Brother        lung   Hyperlipidemia Brother    Hypertension Brother    Breast cancer Paternal Grandmother      Social History   Socioeconomic History   Marital status: Single    Spouse name: Not on file   Number of children: 1   Years of education: community college   Highest education level: Not on file   Occupational History   Not on file  Tobacco Use   Smoking status: Former    Packs/day: 0.50    Years: 30.00    Pack years: 15.00    Types: Cigarettes    Quit date: 09/30/2007    Years since quitting: 13.7   Smokeless tobacco: Never  Vaping Use   Vaping Use: Never used  Substance and Sexual Activity   Alcohol use: Yes    Alcohol/week: 0.0 standard drinks    Comment: 1 glass of wine daily   Drug use: No   Sexual activity: Not Currently  Other Topics Concern   Not on file  Social History Narrative   08/30/20   From: the area   Living: alone   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      Family: daughter - Nira Conn - lives in [place]      Enjoys: difficult with her work hours, dinner with friends, play cards, shop      Exercise: not currently   Diet: tries to follow diabetic diet, limits sweets and fried foods      Safety   Seat belts: Yes    Guns: No   Safe in relationships: Yes    Social Determinants of  Health   Financial Resource Strain: Not on file  Food Insecurity: Not on file  Transportation Needs: Not on file  Physical Activity: Not on file  Stress: Not on file  Social Connections: Not on file  Intimate Partner Violence: Not on file    She is an optician, 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 Rarely some chemicals.  Has lived in [place]  No pets.   Allergies  Allergen Reactions   Influenza Vaccines     Muscle cramps, shaking   Trelegy Ellipta [Fluticasone-Umeclidin-Vilant] Hives    Itching in face/neck     Outpatient Medications Prior to Visit  Medication Sig Dispense Refill   acetaminophen (TYLENOL) 500 MG tablet Take 500 mg by mouth daily as needed.     budesonide-formoterol (SYMBICORT) 160-4.5 MCG/ACT inhaler Inhale 2 puffs into the lungs 2 (two) times daily. Need to establish with new pcp 3 each 2   cetirizine (ZYRTEC) 10 MG tablet Take 10 mg by mouth at bedtime.     glucose blood test strip Check blood sugar once daily and as directed with  onetouch ultra blue test strips. Dx E11.9 100 each 0   hydrochlorothiazide (HYDRODIURIL) 25 MG tablet Take 1 tablet (25 mg total) by mouth daily. 90 tablet 2   metoprolol succinate (TOPROL-XL) 25 MG 24 hr tablet Take 1 tablet (25 mg total) by mouth daily. 90 tablet 2   omeprazole (PRILOSEC) 40 MG capsule TAKE 1 CAPSULE DAILY 90 capsule 3   ONETOUCH DELICA LANCETS 61Y MISC Check blood sugar once daily and as directed. Dx E11.9 100 each 0   OVER THE COUNTER MEDICATION Phason- otc gas relief- prn     rosuvastatin (CRESTOR) 10 MG tablet Take 1 tablet (10 mg total) by mouth daily. 90 tablet 3   Spacer/Aero-Holding Chambers (AEROCHAMBER MV) inhaler Use as instructed 1 each 0   tiotropium (SPIRIVA) 18 MCG inhalation capsule Place 1 capsule (18 mcg total) into inhaler and inhale daily. 90 capsule 3   triamcinolone (KENALOG) 0.025 % cream Apply 1 application topically 2 (two) times daily. 15 g 2   azelastine (ASTELIN) 0.1 % nasal spray Place 2 sprays into both nostrils 2 (two) times daily.     No facility-administered medications prior to visit.         Objective:   Physical Exam Vitals:   07/03/21 1411  BP: (!) 142/78  Pulse: 100  Temp: (!) 97.5 F (36.4 C)  TempSrc: Oral  SpO2: 94%  Weight: 136 lb 12.8 oz (62.1 kg)  Height: 5' 2.5" (1.588 m)   Gen: Pleasant, well-nourished, in no distress,  normal affect  ENT: No lesions,  mouth clear,  oropharynx clear, no postnasal drip  Neck: No JVD, no stridor  Lungs: No use of accessory muscles, distant, no crackles or wheezing on normal respiration, no wheeze on forced expiration  Cardiovascular: RRR, heart sounds normal, no murmur or gallops, no peripheral edema  Musculoskeletal: No deformities, no cyanosis or clubbing  Neuro: alert, awake, non focal  Skin: Cool, no lesions or rash     Assessment & Plan:  COPD Grade C Overall doing fairly well with good exertional tolerance.  She did have a flare in the summer 2022 after allergic  exposure.  Now back to baseline.  She is on Symbicort and Spiriva, interested in possibly trying Breztri.  We will do a trial and see if she benefits.  If so then we can continue it going forward.  Temporarily hold your Spiriva and Symbicort. We will do a  trial of Breztri 2 puffs twice a day.  You can use this medication with a spacer.  Rinse and gargle after using.  If you prefer this medication then please call and let us know so we can send a prescription to your pharmacy. Keep albuterol available to use 2 puffs if needed for shortness of breath, chest tightness, wheezing.. Follow with Dr. Lamonte Sakai in 12 months or sooner if you have any problems.   Allergic rhinitis Continue Zyrtec as you have been taking it. Continue your Astelin nasal spray, 2 sprays each nostril when you need it for congestion   Baltazar Apo, MD, PhD 07/03/2021, 2:56 PM  Pulmonary and Critical Care [phone] or if no answer before 7:00PM call [phone] For any issues after 7:00PM please call eLink [phone]

## 2021-07-03 NOTE — Patient Instructions (Addendum)
Temporarily hold your Spiriva and Symbicort. We will do a trial of Breztri 2 puffs twice a day.  You can use this medication with a spacer.  Rinse and gargle after using.  If you prefer this medication then please call and let us know so we can send a prescription to your pharmacy. Keep albuterol available to use 2 puffs if needed for shortness of breath, chest tightness, wheezing. Continue Zyrtec as you have been taking it. Continue your Astelin nasal spray, 2 sprays each nostril when you need it for congestion. Follow with Dr. Lamonte Sakai in 12 months or sooner if you have any problems.

## 2021-07-03 NOTE — Addendum Note (Signed)
Addended by: Gavin Potters R on: 07/03/2021 03:05 PM   Modules accepted: Orders

## 2021-07-03 NOTE — Assessment & Plan Note (Signed)
Continue Zyrtec as you have been taking it. Continue your Astelin nasal spray, 2 sprays each nostril when you need it for congestion

## 2021-07-08 ENCOUNTER — Institutional Professional Consult (permissible substitution): Payer: Medicare Other | Admitting: Pulmonary Disease

## 2021-07-15 ENCOUNTER — Ambulatory Visit: Payer: Medicare Other | Admitting: Interventional Cardiology

## 2021-07-16 ENCOUNTER — Encounter: Payer: Self-pay | Admitting: Family Medicine

## 2021-07-21 ENCOUNTER — Encounter: Payer: Self-pay | Admitting: Family Medicine

## 2021-07-22 ENCOUNTER — Other Ambulatory Visit: Payer: Self-pay

## 2021-07-22 ENCOUNTER — Telehealth: Payer: Self-pay | Admitting: Family Medicine

## 2021-07-22 ENCOUNTER — Ambulatory Visit (INDEPENDENT_AMBULATORY_CARE_PROVIDER_SITE_OTHER): Payer: Medicare Other | Admitting: Family Medicine

## 2021-07-22 ENCOUNTER — Encounter: Payer: Self-pay | Admitting: Family Medicine

## 2021-07-22 VITALS — BP 156/70 | HR 89 | Temp 96.4°F | Ht 62.5 in | Wt 137.4 lb

## 2021-07-22 DIAGNOSIS — N764 Abscess of vulva: Secondary | ICD-10-CM

## 2021-07-22 MED ORDER — SULFAMETHOXAZOLE-TRIMETHOPRIM 800-160 MG PO TABS: 1.0000 | ORAL_TABLET | Freq: Two times a day (BID) | ORAL | 0 refills | Status: AC

## 2021-07-22 NOTE — Telephone Encounter (Signed)
See note from today

## 2021-07-22 NOTE — Telephone Encounter (Signed)
Appt scheduled 07/23/21 at 2:10, arrive by 2:00 Dr Roland Rack at [place] [facility] Address: [address], [place], [place] [address] Phone: [phone]  Called patient to give appt information and she said the cyst/abscess has now ruptured and is draining really bad, bleeding really heavy (filled a pad)  She is having very little pain, just stinging and burning.   Bleeding is steady now, not gushing.   Pt had a pad on for about 2-2.5 hours before it filled up with blood. She is on her second pad now. Seems to be slowing some.   Recommendations per Dr Einar Pheasant,   "she states that if she is bleeding that heavily and it hasn't slowed down then go to ER. Or she could monitor it for an hour or 2 and if bleeding slows down then still go to GYN appt tomorrow"  Advised patient to use only warm washcloth and antibacterial soap (per CMA) - nothing scented, no Cottonelle Wipes.  Advised patient that if bleeding does not slow down or becomes heavier, severe pain to go to ED.  She is going to watch it and will call the office if anything changes - I told her to keep [place] in the loop if theres a change in symptoms - call our office before 5 or after 5 she can inform the after hours nurse.  Will send to Dr Einar Pheasant as Juluis Rainier

## 2021-07-22 NOTE — Patient Instructions (Signed)
Skin infection - start the antibiotic as soon as you get it - take twice daily - Someone will call you about the referral to OB/GYN - if you develop fevers, chills, fast heart rate, nausea, vomiting, worsening symptoms go to the ER.

## 2021-07-22 NOTE — Telephone Encounter (Signed)
Per appt notes pt already has appt with Dr Einar Pheasant 07/22/21 at 12 noon. Sending note to Dr Einar Pheasant and [facility] CMA.

## 2021-07-22 NOTE — Telephone Encounter (Signed)
Reassuring that bleeding has improved. Agree with close monitoring and ER if starting again

## 2021-07-22 NOTE — Telephone Encounter (Signed)
[facility] - Client Nonclinical Telephone Record  [facility] Client [facility] - Client Client Site [place] Physician Waunita Schooner- MD Contact Type Call Who Is Calling Patient / Member / Family / Caregiver Caller Name Landrie Beale Caller Phone Number [phone] Patient Name Monique Sanchez Patient DOB Jan 19, 1951 Call Type Message Only Information Provided Reason for Call Request to Schedule Office Appointment Initial Comment Caller states she needs an appointment. Patient request to speak to RN No Additional Comment Declined triage. Disp. Time Disposition Final User 07/22/2021 8:12:14 AM General Information Provided Yes Linda Hedges, Leilani Call Closed By: Tula Nakayama Buccino Transaction Date/Time: 07/22/2021 8:09:52 AM (ET

## 2021-07-22 NOTE — Progress Notes (Signed)
   Subjective:     Monique Sanchez is a 70 y.o. female presenting for Cyst (External groin area x 3 days )     HPI  #Boils - infrequent - 3 days ago - had a small one last week - it ruptured and resolved - the one today appeared on Friday - start suprapubic and travels down - currently able to urinate - can feel the pressure in the area   Review of Systems   Social History   Tobacco Use  Smoking Status Former   Packs/day: 0.50   Years: 30.00   Pack years: 15.00   Types: Cigarettes   Quit date: 09/30/2007   Years since quitting: 13.8  Smokeless Tobacco Never        Objective:    BP Readings from Last 3 Encounters:  07/22/21 (!) 156/70  07/03/21 (!) 142/78  05/01/21 118/64   Wt Readings from Last 3 Encounters:  07/22/21 137 lb 6 oz (62.3 kg)  07/03/21 136 lb 12.8 oz (62.1 kg)  05/01/21 135 lb (61.2 kg)    BP (!) 156/70   Pulse 89   Temp (!) 96.4 F (35.8 C) (Temporal)   Ht 5' 2.5" (1.588 m)   Wt 137 lb 6 oz (62.3 kg)   SpO2 97%   BMI 24.73 kg/m    Physical Exam Exam conducted with a chaperone present.  Constitutional:      General: She is not in acute distress.    Appearance: She is well-developed. She is not diaphoretic.  HENT:     Right Ear: External ear normal.     Left Ear: External ear normal.  Eyes:     Conjunctiva/sclera: Conjunctivae normal.  Cardiovascular:     Rate and Rhythm: Normal rate.  Pulmonary:     Effort: Pulmonary effort is normal.  Genitourinary:      Comments: Mons with extensive erythema and ttp no discrete fluctuance  Musculoskeletal:     Cervical back: Neck supple.     Comments: Difficulty laying flat 2/2 to back pain  Skin:    General: Skin is warm and dry.     Capillary Refill: Capillary refill takes less than 2 seconds.  Neurological:     Mental Status: She is alert. Mental status is at baseline.  Psychiatric:        Mood and Affect: Mood normal.        Behavior: Behavior normal.           Assessment & Plan:   Problem List Items Addressed This Visit   None Visit Diagnoses     Abscess of right genital labia    -  Primary   Relevant Medications   sulfamethoxazole-trimethoprim (BACTRIM DS) 800-160 MG tablet   Other Relevant Orders   Ambulatory referral to Obstetrics / Gynecology      Due to location and size of the lesion recommend urgent OB/GYN follow-up. Concern for abscess with cellulitis. Will start abx. Strict ER precautions discussed with patient due to rapid progression and location of the lesion if worsening symptoms.    Return if symptoms worsen or fail to improve.  Lesleigh Noe, MD  This visit occurred during the SARS-CoV-2 public health emergency.  Safety protocols were in place, including screening questions prior to the visit, additional usage of staff PPE, and extensive cleaning of exam room while observing appropriate contact time as indicated for disinfecting solutions.

## 2021-07-23 ENCOUNTER — Ambulatory Visit: Payer: Medicare Other | Admitting: Obstetrics and Gynecology

## 2021-07-23 ENCOUNTER — Encounter: Payer: Self-pay | Admitting: Obstetrics and Gynecology

## 2021-07-23 VITALS — BP 150/65 | HR 105 | Wt 135.0 lb

## 2021-07-23 DIAGNOSIS — N764 Abscess of vulva: Secondary | ICD-10-CM | POA: Diagnosis not present

## 2021-07-23 NOTE — Progress Notes (Signed)
70 yo here for the evaluation of a labial abscess. Patient reports onset of vulva soreness on Friday 10/21. The pain worsened and she was seen by her PCP yesterday and diagnosed with a right labial abscess. She was prescribed antibiotics which she started taking. The abscess started draining spontaneously yesterday after leaving her office which offered her immediate relief. Patient is without any other complaints. She denies any fever.  Past Medical History:  Diagnosis Date   Arthritis    Asthma    Blockage of coronary artery of heart (HCC)    50% blockage single vessels   Claustrophobia    COPD (chronic obstructive pulmonary disease) ([place])    Diverticular disease of colon 2010   GERD (gastroesophageal reflux disease)    Hypercholesteremia    Hypertension    Osteoarthritis    Pre-diabetes    Seasonal allergies    Past Surgical History:  Procedure Laterality Date   ABLATION ON ENDOMETRIOSIS  1976   ANTERIOR CERVICAL DECOMP/DISCECTOMY FUSION  04/20/2014   Procedure: ANTERIOR CERVICAL DECOMPRESSION/DISCECTOMY FUSION 2 LEVELS;  Surgeon: Sinclair Ship, MD;  Location: [facility];  Service: Orthopedics;;  Anterior cervical decompression fusion, cervical 4-5, cervical 5-6 with instrumentation and allograft, possible C4 corpectomy.   CARDIAC CATHETERIZATION  2010   [facility]; Dr. 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   CHOLECYSTECTOMY  2011   COLON RESECTION     for diverticulitis   COLON SURGERY Left 2010   COLONOSCOPY WITH PROPOFOL N/A 07/08/2017   Procedure: COLONOSCOPY WITH PROPOFOL;  Surgeon: Manya Silvas, MD;  Location: [facility] ENDOSCOPY;  Service: Endoscopy;  Laterality: N/A;   FOOT SURGERY  1995   laser capsulotomy  11/2019   Family History  Problem Relation Age of Onset   Heart disease Mother     Cancer Mother        mets to lung, primary unclear   Heart disease Father    Lung cancer Brother        lung   Hyperlipidemia Brother    Hypertension Brother    Breast cancer Paternal Grandmother    Social History   Tobacco Use   Smoking status: Former    Packs/day: 0.50    Years: 30.00    Pack years: 15.00    Types: Cigarettes    Quit date: 09/30/2007    Years since quitting: 13.8   Smokeless tobacco: Never  Vaping Use   Vaping Use: Never used  Substance Use Topics   Alcohol use: Yes    Alcohol/week: 0.0 standard drinks    Comment: 1 glass of wine daily   Drug use: No   ROS See pertinent in HPI. All other systems reviewed and non contributory Blood pressure (!) 150/65, pulse (!) 105, weight 135 lb (61.2 kg). GENERAL: Well-developed, well-nourished female in no acute distress.  ABDOMEN: Soft, nontender, nondistended. No organomegaly. PELVIC: Normal external female genitalia with a 2 cm right labia minora abscess with an opening at the top near clitoral hood. Area is non tender to touch and purulent discharge can be expressed. Chaperone present during the pelvic exam EXTREMITIES: No cyanosis, clubbing, or edema, 2+ distal pulses.  A/P 70 yo with draining right labial abscess - Continue antibiotics - Advised to perform sitz baths and apply warm compresses to the area at least 15 minutes hourly - Patient  admits to poorly controlled type 2 DM without medication. Informed patient that poorly controlled DM, may be a cause for abscesses - RTC prn

## 2021-07-23 NOTE — Progress Notes (Signed)
Referred by PCP for cyst on right labia, started draining yesterday after she left office

## 2021-07-25 ENCOUNTER — Telehealth: Payer: Self-pay | Admitting: Emergency Medicine

## 2021-07-25 MED ORDER — PREDNISONE 10 MG PO TABS: ORAL_TABLET | ORAL | 0 refills | Status: DC

## 2021-07-25 NOTE — Telephone Encounter (Signed)
Spoke to patient.  C/o sob with exertion and occ at rest, nasal congestion, productive cough with yellow sputum, wheezing and temp of 99 last night. Sx started 07/24/2021. She does not wear supplemental oxygen. Spo2 maintaining around 97% She is taking spiriva once daily, symbicort BID and ventolin once daily. She wes given a sample of breztri but she has not started it yet. Negative home test one week ago due to body aches after vacation.  Not vaccinated.  Currently on Bactrim for abscess on leg.   Dr. Lamonte Sakai, please advise. Thanks

## 2021-07-25 NOTE — Telephone Encounter (Signed)
Spoke with the pt and notified of response per RB  She verbalized understanding  Rx for pred was sent to pharm  She is aware to call if not improving

## 2021-07-25 NOTE — Telephone Encounter (Signed)
The Bactrim should also be helpful with suspected bronchitis, agree with this.  Have her take pred > Take 40mg  daily for 3 days, then 30mg  daily for 3 days, then 20mg  daily for 3 days, then 10mg  daily for 3 days, then stop.  She needs to call us and be seen if not improving

## 2021-08-05 DIAGNOSIS — Z86010 Personal history of colonic polyps: Secondary | ICD-10-CM | POA: Diagnosis not present

## 2021-08-28 ENCOUNTER — Encounter: Payer: Self-pay | Admitting: Family Medicine

## 2021-08-28 DIAGNOSIS — E782 Mixed hyperlipidemia: Secondary | ICD-10-CM

## 2021-08-29 MED ORDER — ROSUVASTATIN CALCIUM 10 MG PO TABS: 10.0000 mg | ORAL_TABLET | Freq: Every day | ORAL | 0 refills | Status: DC

## 2021-09-06 ENCOUNTER — Ambulatory Visit (HOSPITAL_COMMUNITY)
Admission: EM | Admit: 2021-09-06 | Discharge: 2021-09-06 | Disposition: A | Discharge status: Home / Self Care | Payer: Medicare Other | Attending: Urgent Care | Admitting: Urgent Care

## 2021-09-06 ENCOUNTER — Ambulatory Visit (INDEPENDENT_AMBULATORY_CARE_PROVIDER_SITE_OTHER): Payer: Medicare Other

## 2021-09-06 ENCOUNTER — Encounter (HOSPITAL_COMMUNITY): Payer: Self-pay | Admitting: *Deleted

## 2021-09-06 ENCOUNTER — Other Ambulatory Visit: Payer: Self-pay

## 2021-09-06 DIAGNOSIS — I519 Heart disease, unspecified: Secondary | ICD-10-CM

## 2021-09-06 DIAGNOSIS — R059 Cough, unspecified: Secondary | ICD-10-CM | POA: Diagnosis not present

## 2021-09-06 DIAGNOSIS — U071 COVID-19: Secondary | ICD-10-CM | POA: Insufficient documentation

## 2021-09-06 DIAGNOSIS — J449 Chronic obstructive pulmonary disease, unspecified: Secondary | ICD-10-CM | POA: Insufficient documentation

## 2021-09-06 DIAGNOSIS — R509 Fever, unspecified: Secondary | ICD-10-CM | POA: Diagnosis not present

## 2021-09-06 DIAGNOSIS — R0902 Hypoxemia: Secondary | ICD-10-CM | POA: Diagnosis not present

## 2021-09-06 MED ORDER — MOLNUPIRAVIR EUA 200MG CAPSULE: 4.0000 | ORAL_CAPSULE | Freq: Two times a day (BID) | ORAL | 0 refills | Status: AC

## 2021-09-06 NOTE — ED Triage Notes (Signed)
Pt reports not feeling well and did a home COVID test that was positive . Pt reports to fell fatigued. Pt has had a fever ,HA, upset stomach.

## 2021-09-06 NOTE — ED Provider Notes (Signed)
[place]   MRN: [id number] DOB: [birth year]  Subjective:   Monique Sanchez is a 70 y.o. female presenting for 2-day history of acute onset coughing, fatigue, slight fever and upset stomach.  Patient did a COVID test at home and was positive.  She has a history of COPD.  Does not use oxygen at home.  She also has a history of asthma.  No current facility-administered medications for this encounter.  Current Outpatient Medications:    acetaminophen (TYLENOL) 500 MG tablet, Take 500 mg by mouth daily as needed., Disp: , Rfl:    azelastine (ASTELIN) 0.1 % nasal spray, Place 2 sprays into both nostrils 2 (two) times daily., Disp: , Rfl:    Budeson-Glycopyrrol-Formoterol (BREZTRI AEROSPHERE) 160-9-4.8 MCG/ACT AERO, Inhale 2 puffs into the lungs in the morning and at bedtime., Disp: 5.9 g, Rfl: 0   budesonide-formoterol (SYMBICORT) 160-4.5 MCG/ACT inhaler, Inhale 2 puffs into the lungs 2 (two) times daily. Need to establish with new pcp, Disp: 3 each, Rfl: 2   cetirizine (ZYRTEC) 10 MG tablet, Take 10 mg by mouth at bedtime., Disp: , Rfl:    glucose blood test strip, Check blood sugar once daily and as directed with onetouch ultra blue test strips. Dx E11.9, Disp: 100 each, Rfl: 0   hydrochlorothiazide (HYDRODIURIL) 25 MG tablet, Take 1 tablet (25 mg total) by mouth daily., Disp: 90 tablet, Rfl: 2   metoprolol succinate (TOPROL-XL) 25 MG 24 hr tablet, Take 1 tablet (25 mg total) by mouth daily., Disp: 90 tablet, Rfl: 2   omeprazole (PRILOSEC) 40 MG capsule, TAKE 1 CAPSULE DAILY, Disp: 90 capsule, Rfl: 3   ONETOUCH DELICA LANCETS 29J MISC, Check blood sugar once daily and as directed. Dx E11.9, Disp: 100 each, Rfl: 0   OVER THE COUNTER MEDICATION, Phason- otc gas relief- prn, Disp: , Rfl:    predniSONE (DELTASONE) 10 MG tablet, 4 x 3 days, 3 x 3 days, 2 x 3 days, 1 x 3 days then stop, Disp: 30 tablet, Rfl: 0   rosuvastatin (CRESTOR) 10 MG tablet, Take 1 tablet (10 mg total) by  mouth daily. For cholesterol., Disp: 90 tablet, Rfl: 0   Spacer/Aero-Holding Chambers (AEROCHAMBER MV) inhaler, Use as instructed, Disp: 1 each, Rfl: 0   tiotropium (SPIRIVA) 18 MCG inhalation capsule, Place 1 capsule (18 mcg total) into inhaler and inhale daily., Disp: 90 capsule, Rfl: 3   triamcinolone (KENALOG) 0.025 % cream, Apply 1 application topically 2 (two) times daily., Disp: 15 g, Rfl: 2   Allergies  Allergen Reactions   Influenza Vaccines     Muscle cramps, shaking   Trelegy Ellipta [Fluticasone-Umeclidin-Vilant] Hives    Itching in face/neck    Past Medical History:  Diagnosis Date   Arthritis    Asthma    Blockage of coronary artery of heart (HCC)    50% blockage single vessels   Claustrophobia    COPD (chronic obstructive pulmonary disease) ([place])    Diverticular disease of colon 2010   GERD (gastroesophageal reflux disease)    Hypercholesteremia    Hypertension    Osteoarthritis    Pre-diabetes    Seasonal allergies      Past Surgical History:  Procedure Laterality Date   ABLATION ON ENDOMETRIOSIS  1976   ANTERIOR CERVICAL DECOMP/DISCECTOMY FUSION  04/20/2014   Procedure: ANTERIOR CERVICAL DECOMPRESSION/DISCECTOMY FUSION 2 LEVELS;  Surgeon: Sinclair Ship, MD;  Location: [facility];  Service: Orthopedics;;  Anterior cervical decompression fusion, cervical 4-5, cervical  5-6 with instrumentation and allograft, possible C4 corpectomy.   CARDIAC CATHETERIZATION  2010   [facility]; Dr. 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   CHOLECYSTECTOMY  2011   COLON RESECTION     for diverticulitis   COLON SURGERY Left 2010   COLONOSCOPY WITH PROPOFOL N/A 07/08/2017   Procedure: COLONOSCOPY WITH PROPOFOL;  Surgeon: Manya Silvas, MD;  Location: [facility] ENDOSCOPY;  Service: Endoscopy;  Laterality:  N/A;   FOOT SURGERY  1995   laser capsulotomy  11/2019    Family History  Problem Relation Age of Onset   Heart disease Mother    Cancer Mother        mets to lung, primary unclear   Heart disease Father    Lung cancer Brother        lung   Hyperlipidemia Brother    Hypertension Brother    Breast cancer Paternal Grandmother     Social History   Tobacco Use   Smoking status: Former    Packs/day: 0.50    Years: 30.00    Pack years: 15.00    Types: Cigarettes    Quit date: 09/30/2007    Years since quitting: 13.9   Smokeless tobacco: Never  Vaping Use   Vaping Use: Never used  Substance Use Topics   Alcohol use: Yes    Alcohol/week: 0.0 standard drinks    Comment: 1 glass of wine daily   Drug use: No    ROS   Objective:   Vitals: BP (!) 172/77   Pulse (!) 104   Temp 99.2 F (37.3 C)   Resp 20   SpO2 92%   Physical Exam Constitutional:      General: She is not in acute distress.    Appearance: Normal appearance. She is well-developed. She is not ill-appearing, toxic-appearing or diaphoretic.  HENT:     Head: Normocephalic and atraumatic.     Right Ear: Tympanic membrane, ear canal and external ear normal. No drainage or tenderness. No middle ear effusion. There is no impacted cerumen. Tympanic membrane is not erythematous.     Left Ear: Tympanic membrane, ear canal and external ear normal. No drainage or tenderness.  No middle ear effusion. There is no impacted cerumen. Tympanic membrane is not erythematous.     Nose: Nose normal. No congestion or rhinorrhea.     Mouth/Throat:     Mouth: Mucous membranes are moist. No oral lesions.     Pharynx: No pharyngeal swelling, oropharyngeal exudate, posterior oropharyngeal erythema or uvula swelling.     Tonsils: No tonsillar exudate or tonsillar abscesses.  Eyes:     General: No scleral icterus.       Right eye: No discharge.        Left eye: No discharge.     Extraocular Movements: Extraocular movements intact.      Right eye: Normal extraocular motion.     Left eye: Normal extraocular motion.     Conjunctiva/sclera: Conjunctivae normal.     Pupils: Pupils are equal, round, and reactive to light.  Cardiovascular:     Rate and Rhythm: Normal rate and regular rhythm.     Pulses: Normal pulses.     Heart sounds: Normal heart sounds. No murmur heard.   No friction rub. No gallop.  Pulmonary:     Effort: Pulmonary  effort is normal. No respiratory distress.     Breath sounds: Normal breath sounds. No stridor. No wheezing, rhonchi or rales.  Musculoskeletal:     Cervical back: Normal range of motion and neck supple.  Lymphadenopathy:     Cervical: No cervical adenopathy.  Skin:    General: Skin is warm and dry.     Findings: No rash.  Neurological:     General: No focal deficit present.     Mental Status: She is alert and oriented to person, place, and time.  Psychiatric:        Mood and Affect: Mood normal.        Behavior: Behavior normal.        Thought Content: Thought content normal.        Judgment: Judgment normal.   DG Chest 2 View  Result Date: 09/06/2021 CLINICAL DATA:  Cough, fever, hypoxia EXAM: CHEST - 2 VIEW COMPARISON:  12/27/2013 FINDINGS: Lungs are clear.  No pleural effusion or pneumothorax. The heart is normal in size.  Thoracic aortic atherosclerosis. Cervical spine fixation hardware, incompletely visualized. Degenerative changes of the visualized thoracolumbar spine. IMPRESSION: Normal chest radiographs. Electronically Signed   By: Julian Hy M.D.   On: 09/06/2021 20:10      Assessment and Plan :   PDMP not reviewed this encounter.  1. Clinical diagnosis of COVID-19   2. Chronic obstructive pulmonary disease, unspecified COPD type ([place])   3. Heart disease    Recommended starting COVID antiviral's with molnupiravir.  Use supportive care otherwise. Counseled patient on potential for adverse effects with medications prescribed/recommended today, ER and  return-to-clinic precautions discussed, patient verbalized understanding.    Jaynee Eagles, [place] 09/07/21 [phone]

## 2021-09-07 LAB — SARS CORONAVIRUS 2 (TAT 6-24 HRS): SARS Coronavirus 2: POSITIVE — AB

## 2021-09-08 ENCOUNTER — Encounter: Payer: Self-pay | Admitting: Emergency Medicine

## 2021-09-11 ENCOUNTER — Encounter: Payer: Self-pay | Admitting: Emergency Medicine

## 2021-09-11 ENCOUNTER — Telehealth (INDEPENDENT_AMBULATORY_CARE_PROVIDER_SITE_OTHER): Payer: Medicare Other | Admitting: Emergency Medicine

## 2021-09-11 ENCOUNTER — Telehealth: Payer: Self-pay | Admitting: Emergency Medicine

## 2021-09-11 ENCOUNTER — Other Ambulatory Visit: Payer: Self-pay

## 2021-09-11 DIAGNOSIS — U071 COVID-19: Secondary | ICD-10-CM | POA: Diagnosis not present

## 2021-09-11 DIAGNOSIS — J449 Chronic obstructive pulmonary disease, unspecified: Secondary | ICD-10-CM | POA: Diagnosis not present

## 2021-09-11 MED ORDER — BREZTRI AEROSPHERE 160-9-4.8 MCG/ACT IN AERO: 2.0000 | INHALATION_SPRAY | Freq: Two times a day (BID) | RESPIRATORY_TRACT | 11 refills | Status: DC

## 2021-09-11 NOTE — Progress Notes (Signed)
Virtual Visit via Video Note  I connected with Monique Sanchez on 09/11/21 at  4:30 PM EST by a video enabled telemedicine application and verified that I am speaking with the correct person using two identifiers.  Location: Patient: Home Provider: Office   I discussed the limitations of evaluation and management by telemedicine and the availability of in person appointments. The patient expressed understanding and agreed to proceed.  History of Present Illness: 70 year old woman with a history of COPD/asthma.  Past medical history also significant for CAD, hypertension, hyperlipidemia, prediabetes and allergies.  I last saw her in October 2022.  At that time we did a trial of changing Symbicort and Spiriva over to [place] to see if she would get benefit.  She had a flare requiring prednisone taper in late October. Unfortunately diagnosed with COVID-19 on 12/9 and started on mulnupuravir for 5 days.  Chest x-ray was reassuring.   Observations/Objective: She reports feeling a bit better. She is using tylenol cold and flu.  Still some cough, fatigue. She is having intermittent clear mucous.   Her breathing is doing well - she feels the the [place] is helpful. Needs a script for this. She is using albuterol rarely. Not since she has been sick.  Having some nasal burning and sinus drainage. Using some astelin.   Assessment and Plan: COVID-19 virus infection She is actually doing fairly well, starting to feel better.  Just completed her antiviral.  Has not needed her albuterol.  I reviewed with her the potential symptoms that would worsen if she began to flare due to this infection including signs of COPD exacerbation, acute bronchitis.  She will let us know if she has any evidence of these.  Discussed possible cough suppression.  For now she is going to use Tylenol Cold and flu.  She will call if she needs something else. She was not vaccinated.  We will need to talk about possible vaccination  going forward after she clears this acute infection  COPD Grade C Overall stable even with the new diagnosis of COVID-19.  She benefited from the [place].  I will call in a prescription for her today.  Continue albuterol as needed.   Follow Up Instructions: 6 months   I discussed the assessment and treatment plan with the patient. The patient was provided an opportunity to ask questions and all were answered. The patient agreed with the plan and demonstrated an understanding of the instructions.   The patient was advised to call back or seek an in-person evaluation if the symptoms worsen or if the condition fails to improve as anticipated.  I provided 15 minutes of non-face-to-face time during this encounter.   Collene Gobble, MD

## 2021-09-11 NOTE — Assessment & Plan Note (Addendum)
She is actually doing fairly well, starting to feel better.  Just completed her antiviral.  Has not needed her albuterol.  I reviewed with her the potential symptoms that would worsen if she began to flare due to this infection including signs of COPD exacerbation, acute bronchitis.  She will let us know if she has any evidence of these.  Discussed possible cough suppression.  For now she is going to use Tylenol Cold and flu.  She will call if she needs something else. She was not vaccinated.  We will need to talk about possible vaccination going forward after she clears this acute infection

## 2021-09-11 NOTE — Telephone Encounter (Signed)
Spoke with the pt  She states her covid symptoms seem to be improving- cough, aches  Her sinus congestion is not improving though, and she has some sinus pressure and pain  She is taking otc cold and flu med without relief  She has still had some low grade fever off and on  Her cough is occ prod with clear sputum  She is asking for recs to help with sinus issue  Video visit with RB at 4:30 today    Allergies  Allergen Reactions   Influenza Vaccines     Muscle cramps, shaking   Trelegy Ellipta [Fluticasone-Umeclidin-Vilant] Hives    Itching in face/neck

## 2021-09-11 NOTE — Assessment & Plan Note (Signed)
Overall stable even with the new diagnosis of COVID-19.  She benefited from the [place].  I will call in a prescription for her today.  Continue albuterol as needed.

## 2021-09-12 ENCOUNTER — Encounter: Payer: Self-pay | Admitting: Emergency Medicine

## 2021-09-12 NOTE — Telephone Encounter (Signed)
Please advise on 5 or 10 days to quarantine.

## 2021-09-13 ENCOUNTER — Telehealth: Payer: Self-pay | Admitting: Emergency Medicine

## 2021-09-13 MED ORDER — DOXYCYCLINE HYCLATE 100 MG PO TABS: 100.0000 mg | ORAL_TABLET | Freq: Two times a day (BID) | ORAL | 0 refills | Status: DC

## 2021-09-13 NOTE — Telephone Encounter (Signed)
Have her switch from plain Tylenol to Tylenol Cold and flu for symptoms if she can tolerate this medication.  I will help with her congestion. Have her try starting nasal saline rinses Take doxycycline 100 mg twice daily for 7 days. Call if she is not improving

## 2021-09-13 NOTE — Addendum Note (Signed)
Addended by: Valerie Salts on: 09/13/2021 04:59 PM   Modules accepted: Orders

## 2021-09-13 NOTE — Telephone Encounter (Signed)
Called and spoke with patient. She stated that she took the Tylenol Cold and Flu and didn't like the way it made her feel. She would like to have the doxy instead. RX has been sent to her pharmacy. Nothing further needed at time of call.

## 2021-09-13 NOTE — Telephone Encounter (Signed)
Primary Pulmonologist: Byrum Last office visit and with whom: 09/11/2021 Byrum (video) What do we see them for (pulmonary problems): COPD, cough, Covid 19 Last OV assessment/plan:     Assessment & Plan Note by Collene Gobble, MD at 09/11/2021 5:03 PM           Author: Collene Gobble, MD Author Type: Physician Filed: 09/11/2021  5:03 PM   Note Status: Written Cosign: Cosign Not Required Encounter Date: 09/11/2021   Problem: COPD Grade C   Editor: Collene Gobble, MD (Physician)                         Overall stable even with the new diagnosis of COVID-19.  She benefited from the [place].  I will call in a prescription for her today.  Continue albuterol as needed.            Was appointment offered to patient (explain)?  no, positive for covid, had video visit on 12/14     Reason for call: Diagnosed with Covid-19, finished antiviral.  complains of stuffy nose, cough and headache.  Facial pain in nasal area.  Yellow/green mucous from nose for 3 days.  Denies any fever.  Congestion in head.    Not taking any decongestants.  Tylenol is not helping with the facial pain.  Patient states she feels like she has a sinus infection. Dr. Lamonte Sakai, please advise.  Thank you.   (examples of things to ask: : When did symptoms start? Fever? Cough? Productive? Color to sputum? More sputum than usual? Wheezing? Have you needed increased oxygen? Are you taking your respiratory medications? What over the counter measures have you tried?)        Allergies  Allergen Reactions   Influenza Vaccines        Muscle cramps, shaking   Trelegy Ellipta [Fluticasone-Umeclidin-Vilant] Hives      Itching in face/neck          Immunization History  Administered Date(s) Administered   Tdap 03/01/2014

## 2021-09-13 NOTE — Telephone Encounter (Signed)
Primary Pulmonologist: Byrum Last office visit and with whom: 09/11/2021 Byrum (video) What do we see them for (pulmonary problems): COPD, cough, Covid 19 Last OV assessment/plan:    Assessment & Plan Note by Collene Gobble, MD at 09/11/2021 5:03 PM  Author: Collene Gobble, MD Author Type: Physician Filed: 09/11/2021  5:03 PM  Note Status: Written Cosign: Cosign Not Required Encounter Date: 09/11/2021  Problem: COPD Grade C  Editor: Collene Gobble, MD (Physician)             Overall stable even with the new diagnosis of COVID-19.  She benefited from the [place].  I will call in a prescription for her today.  Continue albuterol as needed.        Was appointment offered to patient (explain)?  By   Reason for call: Diagnosed with Covid-19, finished antiviral.  stuffy nose, cough and headache.  Facial pain, nasal area.  Yellow/green mucous from nose for 3 days.  Denies any fever.  Congestion in head.    Not taking any decongestants.  Tylenol is not helping with the facial pain.  (examples of things to ask: : When did symptoms start? Fever? Cough? Productive? Color to sputum? More sputum than usual? Wheezing? Have you needed increased oxygen? Are you taking your respiratory medications? What over the counter measures have you tried?)  Allergies  Allergen Reactions   Influenza Vaccines     Muscle cramps, shaking   Trelegy Ellipta [Fluticasone-Umeclidin-Vilant] Hives    Itching in face/neck    Immunization History  Administered Date(s) Administered   Tdap 03/01/2014

## 2021-09-19 ENCOUNTER — Encounter: Payer: Self-pay | Admitting: Family Medicine

## 2021-09-20 ENCOUNTER — Other Ambulatory Visit: Payer: Self-pay

## 2021-09-20 DIAGNOSIS — I10 Essential (primary) hypertension: Secondary | ICD-10-CM

## 2021-09-20 MED ORDER — HYDROCHLOROTHIAZIDE 25 MG PO TABS: 25.0000 mg | ORAL_TABLET | Freq: Every day | ORAL | 3 refills | Status: DC

## 2021-09-20 MED ORDER — METOPROLOL SUCCINATE ER 25 MG PO TB24: 25.0000 mg | ORAL_TABLET | Freq: Every day | ORAL | 3 refills | Status: DC

## 2021-09-30 ENCOUNTER — Encounter: Payer: Self-pay | Admitting: Emergency Medicine

## 2021-10-01 MED ORDER — BREZTRI AEROSPHERE 160-9-4.8 MCG/ACT IN AERO: 2.0000 | INHALATION_SPRAY | Freq: Two times a day (BID) | RESPIRATORY_TRACT | 11 refills | Status: DC

## 2021-10-23 ENCOUNTER — Encounter: Payer: Self-pay | Admitting: Emergency Medicine

## 2021-10-23 MED ORDER — BREZTRI AEROSPHERE 160-9-4.8 MCG/ACT IN AERO: 2.0000 | INHALATION_SPRAY | Freq: Two times a day (BID) | RESPIRATORY_TRACT | 3 refills | Status: DC

## 2021-11-01 ENCOUNTER — Encounter: Payer: Self-pay | Admitting: Gastroenterology

## 2021-11-04 ENCOUNTER — Encounter: Payer: Self-pay | Admitting: Gastroenterology

## 2021-11-04 ENCOUNTER — Other Ambulatory Visit: Payer: Self-pay

## 2021-11-04 ENCOUNTER — Encounter
Admission: RE | Disposition: A | Discharge status: Home / Self Care | Payer: Self-pay | Source: Home / Self Care | Attending: Gastroenterology

## 2021-11-04 ENCOUNTER — Ambulatory Visit
Admission: RE | Admit: 2021-11-04 | Discharge: 2021-11-04 | Disposition: A | Discharge status: Home / Self Care | Payer: Medicare Other | Attending: Gastroenterology | Admitting: Gastroenterology

## 2021-11-04 ENCOUNTER — Ambulatory Visit: Payer: Medicare Other | Admitting: Anesthesiology

## 2021-11-04 DIAGNOSIS — K219 Gastro-esophageal reflux disease without esophagitis: Secondary | ICD-10-CM | POA: Diagnosis not present

## 2021-11-04 DIAGNOSIS — Z87891 Personal history of nicotine dependence: Secondary | ICD-10-CM | POA: Diagnosis not present

## 2021-11-04 DIAGNOSIS — Z86010 Personal history of colonic polyps: Secondary | ICD-10-CM | POA: Diagnosis not present

## 2021-11-04 DIAGNOSIS — Z1211 Encounter for screening for malignant neoplasm of colon: Secondary | ICD-10-CM | POA: Insufficient documentation

## 2021-11-04 DIAGNOSIS — I10 Essential (primary) hypertension: Secondary | ICD-10-CM | POA: Insufficient documentation

## 2021-11-04 DIAGNOSIS — K635 Polyp of colon: Secondary | ICD-10-CM | POA: Diagnosis not present

## 2021-11-04 DIAGNOSIS — K5730 Diverticulosis of large intestine without perforation or abscess without bleeding: Secondary | ICD-10-CM | POA: Insufficient documentation

## 2021-11-04 DIAGNOSIS — K640 First degree hemorrhoids: Secondary | ICD-10-CM | POA: Insufficient documentation

## 2021-11-04 DIAGNOSIS — D123 Benign neoplasm of transverse colon: Secondary | ICD-10-CM | POA: Insufficient documentation

## 2021-11-04 DIAGNOSIS — J449 Chronic obstructive pulmonary disease, unspecified: Secondary | ICD-10-CM | POA: Diagnosis not present

## 2021-11-04 DIAGNOSIS — K644 Residual hemorrhoidal skin tags: Secondary | ICD-10-CM | POA: Insufficient documentation

## 2021-11-04 DIAGNOSIS — D124 Benign neoplasm of descending colon: Secondary | ICD-10-CM | POA: Diagnosis not present

## 2021-11-04 HISTORY — DX: Type 2 diabetes mellitus without complications: E11.9

## 2021-11-04 HISTORY — PX: COLONOSCOPY WITH PROPOFOL: SHX5780

## 2021-11-04 SURGERY — COLONOSCOPY WITH PROPOFOL
Anesthesia: General

## 2021-11-04 MED ORDER — PROPOFOL 10 MG/ML IV BOLUS
INTRAVENOUS | Status: DC | PRN
  Administered 2021-11-04: 80 mg via INTRAVENOUS

## 2021-11-04 MED ORDER — SODIUM CHLORIDE 0.9 % IV SOLN
INTRAVENOUS | Status: DC
  Administered 2021-11-04: 1000 mL via INTRAVENOUS

## 2021-11-04 MED ORDER — PROPOFOL 500 MG/50ML IV EMUL
INTRAVENOUS | Status: DC | PRN
  Administered 2021-11-04: 100 ug/kg/min via INTRAVENOUS

## 2021-11-04 MED ORDER — LIDOCAINE HCL (PF) 1 % IJ SOLN
INTRAMUSCULAR | Status: AC
  Filled 2021-11-04: qty 2

## 2021-11-04 NOTE — Anesthesia Preprocedure Evaluation (Signed)
Anesthesia Evaluation  Patient identified by MRN, date of birth, ID band Patient awake    Reviewed: Allergy & Precautions, NPO status , Patient's Chart, lab work & pertinent test results  History of Anesthesia Complications Negative for: history of anesthetic complications  Airway Mallampati: II       Dental   Pulmonary neg shortness of breath, neg sleep apnea, COPD,  COPD inhaler, neg recent URI, former smoker,           Cardiovascular hypertension, Pt. on medications and Pt. on home beta blockers (-) angina+ CAD  (-) Past MI and (-) CHF (-) dysrhythmias (-) Valvular Problems/Murmurs     Neuro/Psych neg Seizures Anxiety    GI/Hepatic Neg liver ROS, GERD  Medicated and Controlled,  Endo/Other  diabetes, Type 2  Renal/GU negative Renal ROS     Musculoskeletal   Abdominal   Peds  Hematology   Anesthesia Other Findings Past Medical History: No date: Arthritis No date: Asthma No date: Blockage of coronary artery of heart (HCC)     Comment:  50% blockage single vessels No date: Claustrophobia No date: COPD (chronic obstructive pulmonary disease) (HCC) No date: Diabetes mellitus without complication ([place]) 8469: Diverticular disease of colon No date: GERD (gastroesophageal reflux disease) No date: Hypercholesteremia No date: Hypertension No date: Osteoarthritis No date: Pre-diabetes No date: Seasonal allergies   Reproductive/Obstetrics                             Anesthesia Physical  Anesthesia Plan  ASA: 3  Anesthesia Plan: General   Post-op Pain Management:    Induction: Intravenous  PONV Risk Score and Plan: Propofol infusion and TIVA  Airway Management Planned: Nasal Cannula and Natural Airway  Additional Equipment:   Intra-op Plan:   Post-operative Plan:   Informed Consent: I have reviewed the patients History and Physical, chart, labs and discussed the procedure  including the risks, benefits and alternatives for the proposed anesthesia with the patient or authorized representative who has indicated his/her understanding and acceptance.       Plan Discussed with:   Anesthesia Plan Comments:         Anesthesia Quick Evaluation

## 2021-11-04 NOTE — Transfer of Care (Signed)
Immediate Anesthesia Transfer of Care Note  Patient: Monique Sanchez  Procedure(s) Performed: COLONOSCOPY WITH PROPOFOL  Patient Location: PACU and Endoscopy Unit  Anesthesia Type:General  Level of Consciousness: drowsy  Airway & Oxygen Therapy: Patient Spontanous Breathing  Post-op Assessment: Report given to RN and Post -op Vital signs reviewed and stable  Post vital signs: Reviewed and stable  Last Vitals:  Vitals Value Taken Time  BP 118/61 11/04/21 1333  Temp 36.1 C 11/04/21 1332  Pulse 93 11/04/21 1335  Resp 17 11/04/21 1335  SpO2 98 % 11/04/21 1335  Vitals shown include unvalidated device data.  Last Pain:  Vitals:   11/04/21 1332  TempSrc: Temporal  PainSc: 0-No pain         Complications: No notable events documented.

## 2021-11-04 NOTE — Interval H&P Note (Signed)
History and Physical Interval Note: Preprocedure H&P from 11/04/21  was reviewed and there was no interval change after seeing and examining the patient.  Written consent was obtained from the patient after discussion of risks, benefits, and alternatives. Patient has consented to proceed with Colonoscopy with possible intervention   11/04/2021 12:33 PM  Monique Sanchez  has presented today for surgery, with the diagnosis of PH Colonic Polyps.  The various methods of treatment have been discussed with the patient and family. After consideration of risks, benefits and other options for treatment, the patient has consented to  Procedure(s): COLONOSCOPY WITH PROPOFOL (N/A) as a surgical intervention.  The patient's history has been reviewed, patient examined, no change in status, stable for surgery.  I have reviewed the patient's chart and labs.  Questions were answered to the patient's satisfaction.     Annamaria Helling

## 2021-11-04 NOTE — H&P (Signed)
Pre-Procedure H&P   Patient ID: Monique Sanchez is a 71 y.o. female.  Gastroenterology Provider: Annamaria Helling, DO  Referring Provider: Laurine Blazer, PA PCP: Lesleigh Noe, MD  Date: 11/04/2021  HPI Monique Sanchez is a 71 y.o. female who presents today for Colonoscopy for surveillance- phx colon polyps. Pt's bm have been normal. No blood/melena, diarrhea, constipation. No fhx crc or colon polyps Last colonsocopy 06/2017 with 3 TA, sigmoid diverticulosis and internal hemorrhoids No other acute gi complaints.  Past Medical History:  Diagnosis Date   Arthritis    Asthma    Blockage of coronary artery of heart (HCC)    50% blockage single vessels   Claustrophobia    COPD (chronic obstructive pulmonary disease) (HCC)    Diabetes mellitus without complication ([place])    Diverticular disease of colon 2010   GERD (gastroesophageal reflux disease)    Hypercholesteremia    Hypertension    Osteoarthritis    Pre-diabetes    Seasonal allergies     Past Surgical History:  Procedure Laterality Date   ABLATION ON ENDOMETRIOSIS  1976   ANTERIOR CERVICAL DECOMP/DISCECTOMY FUSION  04/20/2014   Procedure: ANTERIOR CERVICAL DECOMPRESSION/DISCECTOMY FUSION 2 LEVELS;  Surgeon: Sinclair Ship, MD;  Location: [facility];  Service: Orthopedics;;  Anterior cervical decompression fusion, cervical 4-5, cervical 5-6 with instrumentation and allograft, possible C4 corpectomy.   CARDIAC CATHETERIZATION  2010   [facility]; Dr. 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   CHOLECYSTECTOMY  2011   COLON RESECTION     for diverticulitis   COLON SURGERY Left 2010   COLONOSCOPY WITH PROPOFOL N/A 07/08/2017   Procedure: COLONOSCOPY WITH PROPOFOL;  Surgeon: Manya Silvas, MD;  Location: [facility] ENDOSCOPY;  Service:  Endoscopy;  Laterality: N/A;   FOOT SURGERY  1995   laser capsulotomy  11/2019    Family History No h/o GI disease or malignancy  Review of Systems  Constitutional:  Negative for activity change, appetite change, chills, diaphoresis, fatigue, fever and unexpected weight change.  HENT:  Negative for trouble swallowing and voice change.   Respiratory:  Positive for cough. Negative for shortness of breath and wheezing.   Cardiovascular:  Negative for chest pain, palpitations and leg swelling.  Gastrointestinal:  Negative for abdominal distention, abdominal pain, anal bleeding, blood in stool, constipation, diarrhea, nausea, rectal pain and vomiting.  Musculoskeletal:  Negative for arthralgias and myalgias.  Skin:  Negative for color change and pallor.  Neurological:  Negative for dizziness, syncope and weakness.  Psychiatric/Behavioral:  Negative for confusion.   All other systems reviewed and are negative.   Medications No current facility-administered medications on file prior to encounter.   Current Outpatient Medications on File Prior to Encounter  Medication Sig Dispense Refill   acetaminophen (TYLENOL) 500 MG tablet Take 500 mg by mouth daily as needed.     albuterol (VENTOLIN HFA) 108 (90 Base) MCG/ACT inhaler Inhale into the lungs every 4 (four) hours as needed for wheezing or shortness of breath.     azelastine (ASTELIN) 0.1 % nasal spray Place 2 sprays into both nostrils 2 (two) times daily.     cetirizine (ZYRTEC) 10 MG tablet Take 10 mg by mouth at bedtime.     glucose blood test strip Check blood sugar once daily and as directed with onetouch  ultra blue test strips. Dx E11.9 100 each 0   omeprazole (PRILOSEC) 40 MG capsule TAKE 1 CAPSULE DAILY 90 capsule 3   ONETOUCH DELICA LANCETS 16R MISC Check blood sugar once daily and as directed. Dx E11.9 100 each 0   OVER THE COUNTER MEDICATION Phason- otc gas relief- prn     predniSONE (DELTASONE) 10 MG tablet 4 x 3 days, 3 x 3  days, 2 x 3 days, 1 x 3 days then stop 30 tablet 0   Spacer/Aero-Holding Chambers (AEROCHAMBER MV) inhaler Use as instructed 1 each 0   tiotropium (SPIRIVA) 18 MCG inhalation capsule Place 1 capsule (18 mcg total) into inhaler and inhale daily. 90 capsule 3   triamcinolone (KENALOG) 0.025 % cream Apply 1 application topically 2 (two) times daily. 15 g 2   budesonide-formoterol (SYMBICORT) 160-4.5 MCG/ACT inhaler Inhale 2 puffs into the lungs 2 (two) times daily. Need to establish with new pcp 3 each 2    Pertinent medications related to GI and procedure were reviewed by me with the patient prior to the procedure   Current Facility-Administered Medications:    0.9 %  sodium chloride infusion, , Intravenous, Continuous, Annamaria Helling, DO, Last Rate: 20 mL/hr at 11/04/21 1205, 1,000 mL at 11/04/21 1205   lidocaine (PF) (XYLOCAINE) 1 % injection, , , ,   sodium chloride 1,000 mL (11/04/21 1205)       Allergies  Allergen Reactions   Influenza Vaccines     Muscle cramps, shaking   Trelegy Ellipta [Fluticasone-Umeclidin-Vilant] Hives    Itching in face/neck   Allergies were reviewed by me prior to the procedure  Objective    Vitals:   11/04/21 1138  BP: (!) 147/82  Pulse: (!) 117  Resp: 18  Temp: (!) 97.5 F (36.4 C)  TempSrc: Temporal  SpO2: 98%  Weight: 60.3 kg  Height: 5' 2.5" (1.588 m)     Physical Exam Vitals and nursing note reviewed.  Constitutional:      General: She is not in acute distress.    Appearance: Normal appearance. She is not ill-appearing, toxic-appearing or diaphoretic.  HENT:     Head: Normocephalic and atraumatic.     Nose: Nose normal.     Mouth/Throat:     Mouth: Mucous membranes are moist.     Pharynx: Oropharynx is clear.  Eyes:     General: No scleral icterus.    Extraocular Movements: Extraocular movements intact.  Cardiovascular:     Rate and Rhythm: Regular rhythm. Tachycardia present.     Heart sounds: Normal heart sounds. No  murmur heard.   No friction rub. No gallop.  Pulmonary:     Effort: Pulmonary effort is normal. No respiratory distress.     Breath sounds: Normal breath sounds. No wheezing, rhonchi or rales.  Abdominal:     General: Bowel sounds are normal. There is no distension.     Palpations: Abdomen is soft.     Tenderness: There is no abdominal tenderness. There is no guarding or rebound.  Musculoskeletal:     Cervical back: Neck supple.     Right lower leg: No edema.     Left lower leg: No edema.  Skin:    General: Skin is warm and dry.     Coloration: Skin is not jaundiced or pale.  Neurological:     General: No focal deficit present.     Mental Status: She is alert and oriented to person, place, and time. Mental status is  at baseline.  Psychiatric:        Mood and Affect: Mood normal.        Behavior: Behavior normal.        Thought Content: Thought content normal.        Judgment: Judgment normal.     Assessment:  Ms. LEAFY MOTSINGER is a 71 y.o. female  who presents today for Colonoscopy for surveillance- phx colon polyps.  Plan:  Colonoscopy with possible intervention today  Colonoscopy with possible biopsy, control of bleeding, polypectomy, and interventions as necessary has been discussed with the patient/patient representative. Informed consent was obtained from the patient/patient representative after explaining the indication, nature, and risks of the procedure including but not limited to death, bleeding, perforation, missed neoplasm/lesions, cardiorespiratory compromise, and reaction to medications. Opportunity for questions was given and appropriate answers were provided. Patient/patient representative has verbalized understanding is amenable to undergoing the procedure.   Annamaria Helling, DO  [facility] Gastroenterology  Portions of the record may have been created with voice recognition software. Occasional wrong-word or 'sound-a-like' substitutions may have  occurred due to the inherent limitations of voice recognition software.  Read the chart carefully and recognize, using context, where substitutions may have occurred.

## 2021-11-04 NOTE — Op Note (Signed)
[facility] Gastroenterology Patient Name: Monique Sanchez Procedure Date: 11/04/2021 12:48 PM MRN: [id number] Account #: [id number] Date of Birth: 20-Mar-1951 Admit Type: Outpatient Age: 71 Room: [facility] ENDO ROOM 2 Gender: Female Note Status: Finalized Instrument Name: Peds Colonoscope [id number] Procedure:             Colonoscopy Indications:           High risk colon cancer surveillance: Personal history                         of colonic polyps Providers:             Rueben Bash, DO Referring MD:          Jobe Marker. Einar Pheasant (Referring MD) Medicines:             Monitored Anesthesia Care Complications:         No immediate complications. Estimated blood loss:                         Minimal. Procedure:             Pre-Anesthesia Assessment:                        - Prior to the procedure, a History and Physical was                         performed, and patient medications and allergies were                         reviewed. The patient is competent. The risks and                         benefits of the procedure and the sedation options and                         risks were discussed with the patient. All questions                         were answered and informed consent was obtained.                         Patient identification and proposed procedure were                         verified by the physician, the nurse, the anesthetist                         and the technician in the endoscopy suite. Mental                         Status Examination: alert and oriented. Airway                         Examination: normal oropharyngeal airway and neck                         mobility. Respiratory Examination: clear to  auscultation. CV Examination: RRR, no murmurs, no S3                         or S4. Prophylactic Antibiotics: The patient does not                         require prophylactic antibiotics. Prior                          Anticoagulants: The patient has taken no previous                         anticoagulant or antiplatelet agents. ASA Grade                         Assessment: III - A patient with severe systemic                         disease. After reviewing the risks and benefits, the                         patient was deemed in satisfactory condition to                         undergo the procedure. The anesthesia plan was to use                         monitored anesthesia care (MAC). Immediately prior to                         administration of medications, the patient was                         re-assessed for adequacy to receive sedatives. The                         heart rate, respiratory rate, oxygen saturations,                         blood pressure, adequacy of pulmonary ventilation, and                         response to care were monitored throughout the                         procedure. The physical status of the patient was                         re-assessed after the procedure.                        After obtaining informed consent, the colonoscope was                         passed under direct vision. Throughout the procedure,                         the patient's blood pressure, pulse, and oxygen  saturations were monitored continuously. The                         Colonoscope was introduced through the anus and                         advanced to the the terminal ileum, with                         identification of the appendiceal orifice and IC                         valve. The colonoscopy was performed without                         difficulty. The patient tolerated the procedure well.                         The quality of the bowel preparation was evaluated                         using the BBPS [facility] Bowel Preparation Scale) with                         scores of: Right Colon = 2 (minor amount of residual                         staining, small  fragments of stool and/or opaque                         liquid, but mucosa seen well), Transverse Colon = 3                         (entire mucosa seen well with no residual staining,                         small fragments of stool or opaque liquid) and Left                         Colon = 3 (entire mucosa seen well with no residual                         staining, small fragments of stool or opaque liquid).                         The total BBPS score equals 8. The quality of the                         bowel preparation was excellent. The terminal ileum,                         ileocecal valve, appendiceal orifice, and rectum were                         photographed. Findings:      Skin tags were found on perianal exam.      The digital rectal exam was normal. Pertinent negatives  include normal       sphincter tone.      The terminal ileum appeared normal. Estimated blood loss: none.      A 1 to 2 mm polyp was found in the transverse colon. The polyp was       sessile. The polyp was removed with a cold biopsy forceps. Resection and       retrieval were complete. Estimated blood loss was minimal.      A 3 to 4 mm polyp was found in the descending colon. The polyp was       sessile. The polyp was removed with a cold snare. Resection and       retrieval were complete. Estimated blood loss was minimal.      Multiple small-mouthed diverticula were found in the recto-sigmoid       colon. Estimated blood loss: none.      Non-bleeding internal hemorrhoids were found during retroflexion. The       hemorrhoids were Grade I (internal hemorrhoids that do not prolapse).       Previous surgical anastamosis site noted at 15cm from the anus      The exam was otherwise without abnormality on direct and retroflexion       views. Impression:            - Perianal skin tags found on perianal exam.                        - The examined portion of the ileum was normal.                        - One 1 to  2 mm polyp in the transverse colon, removed                         with a cold biopsy forceps. Resected and retrieved.                        - One 3 to 4 mm polyp in the descending colon, removed                         with a cold snare. Resected and retrieved.                        - Diverticulosis in the recto-sigmoid colon.                        - Non-bleeding internal hemorrhoids.                        - The examination was otherwise normal on direct and                         retroflexion views. Recommendation:        - Discharge patient to home.                        - Resume previous diet.                        - Continue present medications.                        -  Await pathology results.                        - Repeat colonoscopy for surveillance based on                         pathology results.                        - Return to referring physician as previously                         scheduled.                        - The findings and recommendations were discussed with                         the patient. Procedure Code(s):     --- Professional ---                        [phone], Colonoscopy, flexible; with removal of                         tumor(s), polyp(s), or other lesion(s) by snare                         technique                        45380, 59, Colonoscopy, flexible; with biopsy, single                         or multiple Diagnosis Code(s):     --- Professional ---                        Z86.010, Personal history of colonic polyps                        K64.0, First degree hemorrhoids                        K63.5, Polyp of colon                        K64.4, Residual hemorrhoidal skin tags                        K57.30, Diverticulosis of large intestine without                         perforation or abscess without bleeding CPT copyright 2019 American Medical Association. All rights reserved. The codes documented in this report are preliminary and  upon coder review may  be revised to meet current compliance requirements. Attending Participation:      I personally performed the entire procedure. Volney American, DO Annamaria Helling DO, DO 11/04/2021 1:35:53 PM This report has been signed electronically. Number of Addenda: 0 Note Initiated On: 11/04/2021 12:48 PM Scope Withdrawal Time: 0 hours 17 minutes 25 seconds  Total Procedure Duration: 0 hours 21 minutes 36 seconds  Estimated Blood Loss:  Estimated blood loss was minimal.  [facility]

## 2021-11-04 NOTE — Progress Notes (Signed)
Addendum to H&P:  Pt surgical history: Reports cholecystectomy, c section, laparoscopy with endometriosis removal and partial colon resection 2/2 diverticulosis.  Annamaria Helling, DO [facility] Gastroenterology

## 2021-11-05 ENCOUNTER — Encounter: Payer: Self-pay | Admitting: Gastroenterology

## 2021-11-05 LAB — SURGICAL PATHOLOGY

## 2021-11-05 NOTE — Anesthesia Postprocedure Evaluation (Signed)
Anesthesia Post Note  Patient: Monique Sanchez  Procedure(s) Performed: COLONOSCOPY WITH PROPOFOL  Patient location during evaluation: Endoscopy Anesthesia Type: General Level of consciousness: awake and alert Pain management: pain level controlled Vital Signs Assessment: post-procedure vital signs reviewed and stable Respiratory status: spontaneous breathing, nonlabored ventilation, respiratory function stable and patient connected to nasal cannula oxygen Cardiovascular status: blood pressure returned to baseline and stable Postop Assessment: no apparent nausea or vomiting Anesthetic complications: no   No notable events documented.   Last Vitals:  Vitals:   11/04/21 1352 11/04/21 1402  BP: 140/76   Pulse:    Resp:    Temp:    SpO2: 100% 100%    Last Pain:  Vitals:   11/05/21 0747  TempSrc:   PainSc: 0-No pain                 Martha Clan

## 2021-12-20 ENCOUNTER — Other Ambulatory Visit: Payer: Self-pay | Admitting: Primary Care

## 2021-12-20 DIAGNOSIS — E782 Mixed hyperlipidemia: Secondary | ICD-10-CM

## 2022-01-22 ENCOUNTER — Encounter: Payer: Self-pay | Admitting: Family Medicine

## 2022-01-23 ENCOUNTER — Other Ambulatory Visit: Payer: Self-pay

## 2022-01-23 MED ORDER — OMEPRAZOLE 40 MG PO CPDR: DELAYED_RELEASE_CAPSULE | ORAL | 1 refills | Status: DC

## 2022-05-02 ENCOUNTER — Encounter: Payer: Medicare Other | Admitting: Family Medicine

## 2022-05-09 ENCOUNTER — Telehealth: Payer: Self-pay | Admitting: Family Medicine

## 2022-05-09 ENCOUNTER — Other Ambulatory Visit: Payer: Self-pay | Admitting: Family Medicine

## 2022-05-09 ENCOUNTER — Ambulatory Visit (INDEPENDENT_AMBULATORY_CARE_PROVIDER_SITE_OTHER): Payer: Medicare Other | Admitting: Family Medicine

## 2022-05-09 VITALS — BP 120/62 | HR 68 | Temp 97.6°F | Ht 61.75 in | Wt 138.5 lb

## 2022-05-09 DIAGNOSIS — L309 Dermatitis, unspecified: Secondary | ICD-10-CM

## 2022-05-09 DIAGNOSIS — E785 Hyperlipidemia, unspecified: Secondary | ICD-10-CM

## 2022-05-09 DIAGNOSIS — Z0000 Encounter for general adult medical examination without abnormal findings: Secondary | ICD-10-CM

## 2022-05-09 DIAGNOSIS — J301 Allergic rhinitis due to pollen: Secondary | ICD-10-CM | POA: Diagnosis not present

## 2022-05-09 DIAGNOSIS — I10 Essential (primary) hypertension: Secondary | ICD-10-CM

## 2022-05-09 DIAGNOSIS — E119 Type 2 diabetes mellitus without complications: Secondary | ICD-10-CM | POA: Diagnosis not present

## 2022-05-09 LAB — LIPID PANEL
Cholesterol: 172 mg/dL (ref 0–200)
HDL: 52.1 mg/dL (ref >39.00)
LDL Cholesterol: 81 mg/dL (ref 0–99)
NonHDL: 120.14
Total CHOL/HDL Ratio: 3
Triglycerides: 198 mg/dL — ABNORMAL HIGH (ref 0.0–149.0)
VLDL: 39.6 mg/dL (ref 0.0–40.0)

## 2022-05-09 LAB — COMPREHENSIVE METABOLIC PANEL
ALT: 24 U/L (ref 0–35)
AST: 26 U/L (ref 0–37)
Albumin: 4.5 g/dL (ref 3.5–5.2)
Alkaline Phosphatase: 79 U/L (ref 39–117)
BUN: 20 mg/dL (ref 6–23)
CO2: 32 mEq/L (ref 19–32)
Calcium: 9.1 mg/dL (ref 8.4–10.5)
Chloride: 97 mEq/L (ref 96–112)
Creatinine, Ser: 0.83 mg/dL (ref 0.40–1.20)
GFR: 70.4 mL/min (ref >60.00)
Glucose, Bld: 108 mg/dL — ABNORMAL HIGH (ref 70–99)
Potassium: 3.8 mEq/L (ref 3.5–5.1)
Sodium: 140 mEq/L (ref 135–145)
Total Bilirubin: 1.1 mg/dL (ref 0.2–1.2)
Total Protein: 6.8 g/dL (ref 6.0–8.3)

## 2022-05-09 LAB — MICROALBUMIN / CREATININE URINE RATIO
Creatinine,U: 46.5 mg/dL
Microalb Creat Ratio: 2.6 mg/g (ref 0.0–30.0)
Microalb, Ur: 1.2 mg/dL (ref 0.0–1.9)

## 2022-05-09 LAB — HEMOGLOBIN A1C: Hgb A1c MFr Bld: 7 % — ABNORMAL HIGH (ref 4.6–6.5)

## 2022-05-09 MED ORDER — AZELASTINE HCL 0.1 % NA SOLN: 2.0000 | Freq: Two times a day (BID) | NASAL | 1 refills | Status: AC

## 2022-05-09 MED ORDER — TRIAMCINOLONE ACETONIDE 0.1 % EX CREA: 1.0000 | TOPICAL_CREAM | CUTANEOUS | 0 refills | Status: AC | PRN

## 2022-05-09 NOTE — Patient Instructions (Addendum)
PCV13 (Prevnar 13) PCV20 (Prevnar 20) PPSV23 (Pneumovax23)  We typically do the PCV20 followed by the PPSV23  Shingles - Shingrix (pharmacy)   Consider vaccines  Thank you so much! It has been a pleasure.   I will be in the office through July 04, 2022 and am happy to see you if need anything.  Currently the providers at [facility] that are open to new patients and transfer of care are   Eugenia Pancoast, NP Romilda Garret, DNP  They are both fantastic Nurse Practitioners who helped care for many of my patients during maternity leave and everyone had great interactions.    [facility] is working to find a physician to replace me, but this could take a year. Many of the physicians in the office could provide transitional care for you while you wait on the new physician to start or you can call a different [facility] office to establish with one of the providers below.    Physicians that are accepting new patients at other [facility] offices   Dr. Berniece Pap      [facility] Dr. Esther Hardy      [facility] [address] [place], [place], [place] [address]  [phone]  Dr. Lyndee Leo [address], [place], [place] [address]  [phone]  Dr. Loralyn Freshwater      [facility] [address] [place], [place], [place] [address]  [phone]  Dr. Carollee Leitz  [facility]  [address] [place], [place] [address]  [phone]

## 2022-05-09 NOTE — Progress Notes (Signed)
Subjective:   Monique Sanchez is a 71 y.o. female who presents for Medicare Annual (Subsequent) preventive examination.  Review of Systems    Review of Systems  Constitutional:  Negative for chills and fever.  HENT:  Negative for congestion and sore throat.   Eyes:  Negative for blurred vision and double vision.  Respiratory:  Negative for shortness of breath.   Cardiovascular:  Negative for chest pain.  Gastrointestinal:  Negative for heartburn, nausea and vomiting.  Genitourinary: Negative.   Musculoskeletal: Negative.  Negative for myalgias.  Skin:  Negative for rash.  Neurological:  Negative for dizziness and headaches.  Endo/Heme/Allergies:  Does not bruise/bleed easily.  Psychiatric/Behavioral:  Negative for depression. The patient is not nervous/anxious.     Cardiac Risk Factors include: advanced age (>59mn, >>40women);smoking/ tobacco exposure;sedentary lifestyle     Objective:    Today's Vitals   05/09/22 1001  BP: 120/62  Pulse: 68  Temp: 97.6 F (36.4 C)  TempSrc: Temporal  SpO2: 97%  Weight: 138 lb 8 oz (62.8 kg)  Height: 5' 1.75" (1.568 m)   Body mass index is 25.54 kg/m.     05/09/2022   10:10 AM 11/04/2021   11:35 AM 07/08/2017    9:19 AM 04/18/2014   12:34 PM  Advanced Directives  Does Patient Have a Medical Advance Directive? No No No Patient does not have advance directive;Patient would not like information  Would patient like information on creating a medical advance directive? Yes (MAU/Ambulatory/Procedural Areas - Information given) No - Patient declined No - Patient declined   Pre-existing out of facility DNR order (yellow form or pink MOST form)    No   Social History   Tobacco Use  Smoking Status Former   Packs/day: 0.50   Years: 30.00   Total pack years: 15.00   Types: Cigarettes   Quit date: 09/30/2007   Years since quitting: 14.6  Smokeless Tobacco Never     Current Medications (verified) Outpatient Encounter Medications as  of 05/09/2022  Medication Sig   acetaminophen (TYLENOL) 500 MG tablet Take 500 mg by mouth daily as needed.   albuterol (VENTOLIN HFA) 108 (90 Base) MCG/ACT inhaler Inhale into the lungs every 4 (four) hours as needed for wheezing or shortness of breath.   azelastine (ASTELIN) 0.1 % nasal spray Place 2 sprays into both nostrils 2 (two) times daily.   Budeson-Glycopyrrol-Formoterol (BREZTRI AEROSPHERE) 160-9-4.8 MCG/ACT AERO Inhale 2 puffs into the lungs in the morning and at bedtime.   cetirizine (ZYRTEC) 10 MG tablet Take 10 mg by mouth at bedtime.   glucose blood test strip Check blood sugar once daily and as directed with onetouch ultra blue test strips. Dx E11.9   hydrochlorothiazide (HYDRODIURIL) 25 MG tablet Take 1 tablet (25 mg total) by mouth daily.   metoprolol succinate (TOPROL-XL) 25 MG 24 hr tablet Take 1 tablet (25 mg total) by mouth daily.   omeprazole (PRILOSEC) 40 MG capsule TAKE 1 CAPSULE DAILY   ONETOUCH DELICA LANCETS 396PMISC Check blood sugar once daily and as directed. Dx E11.9   OVER THE COUNTER MEDICATION Phason- otc gas relief- prn   rosuvastatin (CRESTOR) 10 MG tablet TAKE 1 TABLET DAILY FOR CHOLESTEROL   Spacer/Aero-Holding Chambers (AEROCHAMBER MV) inhaler Use as instructed   triamcinolone cream (KENALOG) 0.1 % Apply 1 Application topically as needed (dry skin).   [DISCONTINUED] budesonide-formoterol (SYMBICORT) 160-4.5 MCG/ACT inhaler Inhale 2 puffs into the lungs 2 (two) times daily. Need to establish with new  pcp   [DISCONTINUED] doxycycline (VIBRA-TABS) 100 MG tablet Take 1 tablet (100 mg total) by mouth 2 (two) times daily.   [DISCONTINUED] predniSONE (DELTASONE) 10 MG tablet 4 x 3 days, 3 x 3 days, 2 x 3 days, 1 x 3 days then stop   [DISCONTINUED] tiotropium (SPIRIVA) 18 MCG inhalation capsule Place 1 capsule (18 mcg total) into inhaler and inhale daily.   [DISCONTINUED] triamcinolone (KENALOG) 0.025 % cream Apply 1 application topically 2 (two) times daily.    No facility-administered encounter medications on file as of 05/09/2022.    Allergies (verified) Influenza vaccines and Trelegy ellipta [fluticasone-umeclidin-vilant]   History: Past Medical History:  Diagnosis Date   Arthritis    Asthma    Blockage of coronary artery of heart (HCC)    50% blockage single vessels   Claustrophobia    COPD (chronic obstructive pulmonary disease) (HCC)    Diabetes mellitus without complication ([place])    Diverticular disease of colon 2010   GERD (gastroesophageal reflux disease)    Hypercholesteremia    Hypertension    Osteoarthritis    Pre-diabetes    Seasonal allergies    Past Surgical History:  Procedure Laterality Date   ABLATION ON ENDOMETRIOSIS  1976   ANTERIOR CERVICAL DECOMP/DISCECTOMY FUSION  04/20/2014   Procedure: ANTERIOR CERVICAL DECOMPRESSION/DISCECTOMY FUSION 2 LEVELS;  Surgeon: Sinclair Ship, MD;  Location: [place];  Service: Orthopedics;;  Anterior cervical decompression fusion, cervical 4-5, cervical 5-6 with instrumentation and allograft, possible C4 corpectomy.   CARDIAC CATHETERIZATION  2010   [facility]; Dr. 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   CHOLECYSTECTOMY  2011   COLON RESECTION     for diverticulitis   COLON SURGERY Left 2010   COLONOSCOPY WITH PROPOFOL N/A 07/08/2017   Procedure: COLONOSCOPY WITH PROPOFOL;  Surgeon: Manya Silvas, MD;  Location: [facility] ENDOSCOPY;  Service: Endoscopy;  Laterality: N/A;   COLONOSCOPY WITH PROPOFOL N/A 11/04/2021   Procedure: COLONOSCOPY WITH PROPOFOL;  Surgeon: Annamaria Helling, DO;  Location: [facility] ENDOSCOPY;  Service: Gastroenterology;  Laterality: N/A;   FOOT SURGERY  1995   laser capsulotomy  11/2019   Family History  Problem Relation Age of Onset   Heart disease Mother    Cancer Mother         mets to lung, primary unclear   Heart disease Father    Lung cancer Brother        lung   Hyperlipidemia Brother    Hypertension Brother    Breast cancer Paternal Grandmother    Social History   Socioeconomic History   Marital status: Single    Spouse name: Not on file   Number of children: 1   Years of education: community college   Highest education level: Not on file  Occupational History   Not on file  Tobacco Use   Smoking status: Former    Packs/day: 0.50    Years: 30.00    Total pack years: 15.00    Types: Cigarettes    Quit date: 09/30/2007    Years since quitting: 14.6   Smokeless tobacco: Never  Vaping Use   Vaping Use: Never used  Substance and Sexual Activity   Alcohol use: Yes    Alcohol/week: 0.0 standard drinks of alcohol    Comment: 1 glass of wine daily   Drug  use: No   Sexual activity: Not Currently  Other Topics Concern   Not on file  Social History Narrative   08/30/20   From: the area   Living: alone   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      Family: daughter - Nira Conn - lives in [place]      Enjoys: difficult with her work hours, dinner with friends, play cards, shop      Exercise: not currently   Diet: tries to follow diabetic diet, limits sweets and fried foods      Safety   Seat belts: Yes    Guns: No   Safe in relationships: Yes    Social Determinants of Radio broadcast assistant Strain: Not on file  Food Insecurity: Not on file  Transportation Needs: Not on file  Physical Activity: Not on file  Stress: Not on file  Social Connections: Not on file    Tobacco Counseling Counseling given: Not Answered   Clinical Intake:  Pre-visit preparation completed: No  Pain : No/denies pain     BMI - recorded: 25.54 Nutritional Status: BMI 25 -29 Overweight Nutritional Risks: None Diabetes: No  How often do you need to have someone help you when you read instructions, pamphlets, or other  written materials from your doctor or pharmacy?: 1 - Never  Diabetic?no  Interpreter Needed?: No      Activities of Daily Living    05/09/2022   10:12 AM  In your present state of health, do you have any difficulty performing the following activities:  Hearing? 0  Vision? 0  Difficulty concentrating or making decisions? 0  Walking or climbing stairs? 0  Dressing or bathing? 0  Doing errands, shopping? 0  Preparing Food and eating ? N  Using the Toilet? N  In the past six months, have you accidently leaked urine? N  Do you have problems with loss of bowel control? N  Managing your Medications? N  Managing your Finances? N  Housekeeping or managing your Housekeeping? N    Patient Care Team: Lesleigh Noe, MD as PCP - General (Family Medicine)  Indicate any recent Medical Services you may have received from other than [facility] providers in the past year (date may be approximate).     Assessment:   This is a routine wellness examination for [place].  Hearing/Vision screen Hearing Screening - Comments:: No concerns Vision Screening - Comments:: Last eye exam in last year at [facility]  Dietary issues and exercise activities discussed: Current Exercise Habits: Home exercise routine, Type of exercise: walking, Time (Minutes): 20, Frequency (Times/Week): 3, Weekly Exercise (Minutes/Week): 60, Intensity: Mild, Exercise limited by: respiratory conditions(s)   Goals Addressed             This Visit's Progress    Weight (lb) < 135 lb (61.2 kg)   138 lb 8 oz (62.8 kg)     Depression Screen    08/30/2020   11:03 AM 09/13/2018   10:38 AM 09/01/2017   11:26 AM  PHQ 2/9 Scores  PHQ - 2 Score 0 0 0    Fall Risk    05/09/2022    9:56 AM 05/01/2021   11:04 AM 11/08/2019    4:42 PM 09/13/2018   10:38 AM 09/01/2017   11:26 AM  Fall Risk   Falls in the past year? 0 0 0 0 No  Number falls in past yr: 0 0     Injury with  Fall?  0     Risk for fall due to : No Fall  Risks      Follow up   Falls evaluation completed Falls evaluation completed     [place]:  Any stairs in or around the home? Yes  If so, are there any without handrails? Yes  Home free of loose throw rugs in walkways, pet beds, electrical cords, etc? Yes  Adequate lighting in your home to reduce risk of falls? Yes   ASSISTIVE DEVICES UTILIZED TO PREVENT FALLS:  Life alert? No  Use of a cane, walker or w/c? No  Grab bars in the bathroom? No  Shower chair or bench in shower? No  Elevated toilet seat or a handicapped toilet? No   Cognitive Function:      Mini-Cog - 05/09/22 1013     Normal clock drawing test? yes    How many words correct? 3                Immunizations Immunization History  Administered Date(s) Administered   Tdap 03/01/2014    TDAP status: Up to date  Flu Vaccine status: Due, Education has been provided regarding the importance of this vaccine. Advised may receive this vaccine at local pharmacy or Health Dept. Aware to provide a copy of the vaccination record if obtained from local pharmacy or Health Dept. Verbalized acceptance and understanding.  Pneumococcal vaccine status: Due, Education has been provided regarding the importance of this vaccine. Advised may receive this vaccine at local pharmacy or Health Dept. Aware to provide a copy of the vaccination record if obtained from local pharmacy or Health Dept. Verbalized acceptance and understanding.  Covid-19 vaccine status: Declined, Education has been provided regarding the importance of this vaccine but patient still declined. Advised may receive this vaccine at local pharmacy or Health Dept.or vaccine clinic. Aware to provide a copy of the vaccination record if obtained from local pharmacy or Health Dept. Verbalized acceptance and understanding.  Qualifies for Shingles Vaccine? Yes   Zostavax completed No   Shingrix Completed?: No.    Education has been  provided regarding the importance of this vaccine. Patient has been advised to call insurance company to determine out of pocket expense if they have not yet received this vaccine. Advised may also receive vaccine at local pharmacy or Health Dept. Verbalized acceptance and understanding.  Screening Tests Health Maintenance  Topic Date Due   Zoster Vaccines- Shingrix (1 of 2) Never done   Pneumonia Vaccine 48+ Years old (1 - PCV) Never done   FOOT EXAM  08/30/2021   HEMOGLOBIN A1C  11/01/2021   URINE MICROALBUMIN  05/01/2022   OPHTHALMOLOGY EXAM  06/10/2022   MAMMOGRAM  12/07/2022   TETANUS/TDAP  03/01/2024   COLONOSCOPY (Pts 45-22yr Insurance coverage will need to be confirmed)  11/05/2031   DEXA SCAN  Completed   HPV VACCINES  Aged Out   COVID-19 Vaccine  Discontinued   Hepatitis C Screening  Discontinued    Health Maintenance  Health Maintenance Due  Topic Date Due   Zoster Vaccines- Shingrix (1 of 2) Never done   Pneumonia Vaccine 72 Years old (1 - PCV) Never done   FOOT EXAM  08/30/2021   HEMOGLOBIN A1C  11/01/2021   URINE MICROALBUMIN  05/01/2022    Colorectal cancer screening: Type of screening: Colonoscopy. Completed 2023. Repeat every 10 years  Mammogram status: Completed 11/2020. Repeat every year  Bone Density status: Completed 2022. Results  reflect: Bone density results: OSTEOPOROSIS. Repeat every 2 years.  Lung Cancer Screening: (Low Dose CT Chest recommended if Age 61-80 years, 30 pack-year currently smoking OR have quit w/in 15years.) does not qualify.   Lung Cancer Screening Referral: n/a  Additional Screening:  Hepatitis C Screening: does qualify;   Vision Screening: Recommended annual ophthalmology exams for early detection of glaucoma and other disorders of the eye. Is the patient up to date with their annual eye exam?  Yes    Dental Screening: Recommended annual dental exams for proper oral hygiene  Community Resource Referral / Chronic Care  Management: CRR required this visit?  No   CCM required this visit?  No      Plan:     Problem List Items Addressed This Visit       Cardiovascular and Mediastinum   HTN (hypertension)   Relevant Orders   Comprehensive metabolic panel     Respiratory   Allergic rhinitis     Endocrine   Diet-controlled diabetes mellitus ([place])   Relevant Orders   Hemoglobin A1c   Microalbumin / creatinine urine ratio     Other   HLD (hyperlipidemia)   Relevant Orders   Lipid panel   Other Visit Diagnoses     Encounter for Medicare annual wellness exam    -  Primary   Eczema, unspecified type           I have personally reviewed and noted the following in the patient's chart:   Medical and social history Use of alcohol, tobacco or illicit drugs  Current medications and supplements including opioid prescriptions.  Functional ability and status Nutritional status Physical activity Advanced directives List of other physicians Hospitalizations, surgeries, and ER visits in previous 12 months Vitals Screenings to include cognitive, depression, and falls Referrals and appointments  In addition, I have reviewed and discussed with patient certain preventive protocols, quality metrics, and best practice recommendations. A written personalized care plan for preventive services as well as general preventive health recommendations were provided to patient.     Lesleigh Noe, MD   05/09/2022

## 2022-05-09 NOTE — Telephone Encounter (Signed)
Rx has been filled per pharmacy. Pt made aware.

## 2022-05-09 NOTE — Telephone Encounter (Signed)
Patient received a text message from her pharmacy stating that they didn't receive approval for refills of azelastine (ASTELIN) 0.1 % nasal spray. She stated they need a new script. Please advise. Thank you!

## 2022-05-12 ENCOUNTER — Encounter: Payer: Self-pay | Admitting: Family Medicine

## 2022-05-12 DIAGNOSIS — E782 Mixed hyperlipidemia: Secondary | ICD-10-CM

## 2022-05-12 DIAGNOSIS — E1169 Type 2 diabetes mellitus with other specified complication: Secondary | ICD-10-CM

## 2022-05-12 DIAGNOSIS — E119 Type 2 diabetes mellitus without complications: Secondary | ICD-10-CM

## 2022-05-13 MED ORDER — EMPAGLIFLOZIN 10 MG PO TABS: 10.0000 mg | ORAL_TABLET | Freq: Every day | ORAL | 0 refills | Status: DC

## 2022-05-13 MED ORDER — ROSUVASTATIN CALCIUM 20 MG PO TABS: 20.0000 mg | ORAL_TABLET | Freq: Every day | ORAL | 0 refills | Status: DC

## 2022-06-16 DIAGNOSIS — H5201 Hypermetropia, right eye: Secondary | ICD-10-CM | POA: Diagnosis not present

## 2022-06-16 LAB — HM DIABETES EYE EXAM

## 2022-06-17 ENCOUNTER — Encounter: Payer: Self-pay | Admitting: Family Medicine

## 2022-06-18 MED ORDER — GLUCOSE BLOOD VI STRP: ORAL_STRIP | 2 refills | Status: AC

## 2022-06-24 ENCOUNTER — Telehealth: Payer: Self-pay | Admitting: *Deleted

## 2022-06-24 NOTE — Patient Outreach (Signed)
  Care Coordination   Initial Visit Note   06/24/2022 Name: Monique Sanchez MRN: [id number] DOB: Feb 25, 1951  Monique Sanchez is a 71 y.o. year old female who sees Einar Pheasant, Jobe Marker, MD for primary care. I spoke with  [name] by phone today.  What matters to the patients health and wellness today?  No concerns expressed .  Marland KitchenRN discussed services [facility] services, RN, SW, and Pharmacist. Patient declined services.    Goals Addressed             This Visit's Progress    Patient advised to follow up with PCP for AWV and Vaccines          SDOH assessments and interventions completed:  Yes     Care Coordination Interventions Activated:  Yes  Care Coordination Interventions:  Yes, provided   Follow up plan: No further intervention required.   Encounter Outcome:  [name] Care Management [phone]

## 2022-06-26 ENCOUNTER — Encounter: Payer: Self-pay | Admitting: Family Medicine

## 2022-07-04 ENCOUNTER — Other Ambulatory Visit: Payer: Self-pay | Admitting: Family Medicine

## 2022-07-18 ENCOUNTER — Telehealth: Payer: Self-pay | Admitting: Family Medicine

## 2022-07-18 NOTE — Telephone Encounter (Signed)
Pt called to schedule a TOC but need to have A1C check by 08/09/22 no available appointment until Jan . Please advise # [phone]

## 2022-07-21 NOTE — Telephone Encounter (Signed)
A1C appt made for November, and [facility] scheduled for Jan 2024

## 2022-08-11 ENCOUNTER — Ambulatory Visit (INDEPENDENT_AMBULATORY_CARE_PROVIDER_SITE_OTHER): Payer: Medicare Other | Admitting: Family

## 2022-08-11 ENCOUNTER — Encounter: Payer: Self-pay | Admitting: Family

## 2022-08-11 VITALS — BP 132/60 | HR 63 | Temp 98.0°F | Resp 16 | Ht 61.75 in | Wt 142.1 lb

## 2022-08-11 DIAGNOSIS — E782 Mixed hyperlipidemia: Secondary | ICD-10-CM | POA: Diagnosis not present

## 2022-08-11 DIAGNOSIS — J301 Allergic rhinitis due to pollen: Secondary | ICD-10-CM

## 2022-08-11 DIAGNOSIS — I240 Acute coronary thrombosis not resulting in myocardial infarction: Secondary | ICD-10-CM | POA: Diagnosis not present

## 2022-08-11 DIAGNOSIS — E119 Type 2 diabetes mellitus without complications: Secondary | ICD-10-CM

## 2022-08-11 DIAGNOSIS — J449 Chronic obstructive pulmonary disease, unspecified: Secondary | ICD-10-CM

## 2022-08-11 DIAGNOSIS — Z860100 Personal history of colon polyps, unspecified: Secondary | ICD-10-CM | POA: Insufficient documentation

## 2022-08-11 LAB — BASIC METABOLIC PANEL
BUN: 23 mg/dL (ref 6–23)
CO2: 32 mEq/L (ref 19–32)
Calcium: 9.2 mg/dL (ref 8.4–10.5)
Chloride: 98 mEq/L (ref 96–112)
Creatinine, Ser: 0.88 mg/dL (ref 0.40–1.20)
GFR: 65.51 mL/min (ref >60.00)
Glucose, Bld: 120 mg/dL — ABNORMAL HIGH (ref 70–99)
Potassium: 4 mEq/L (ref 3.5–5.1)
Sodium: 138 mEq/L (ref 135–145)

## 2022-08-11 LAB — LIPID PANEL
Cholesterol: 132 mg/dL (ref 0–200)
HDL: 48.4 mg/dL (ref >39.00)
LDL Cholesterol: 47 mg/dL (ref 0–99)
NonHDL: 83.74
Total CHOL/HDL Ratio: 3
Triglycerides: 185 mg/dL — ABNORMAL HIGH (ref 0.0–149.0)
VLDL: 37 mg/dL (ref 0.0–40.0)

## 2022-08-11 LAB — HEMOGLOBIN A1C: Hgb A1c MFr Bld: 7 % — ABNORMAL HIGH (ref 4.6–6.5)

## 2022-08-11 MED ORDER — METFORMIN HCL 500 MG PO TABS: 500.0000 mg | ORAL_TABLET | Freq: Two times a day (BID) | ORAL | 0 refills | Status: DC

## 2022-08-11 MED ORDER — METFORMIN HCL 500 MG PO TABS: 500.0000 mg | ORAL_TABLET | Freq: Two times a day (BID) | ORAL | 1 refills | Status: DC

## 2022-08-11 NOTE — Assessment & Plan Note (Signed)
Ordered hga1c today pending results. Work on diabetic diet and exercise as tolerated. Yearly foot exam, and annual eye exam.   Jardiance too expensive, advised pt to restart metformin 500 mg bid as tolerated well in past.

## 2022-08-11 NOTE — Assessment & Plan Note (Signed)
Unable to locate procedures or imaging or office notes from cardiologist, pt states about ten years ago. Sending request for records for Dr. Tonye Becket office, cardiology at [facility]. If unable to obtain, we will consider referral again to cardiology. Consider EKG at next visit.

## 2022-08-11 NOTE — Progress Notes (Signed)
Established Patient Office Visit  Subjective:  Patient ID: Monique Sanchez, female    DOB: [birth year]  Age: 71 y.o. MRN: [id number]  CC:  Chief Complaint  Patient presents with   Diabetes   Transitions Of Care    HPI SUNDEE Sanchez is here today for follow up And transition of care.  Prior provider was Dr. Waunita Schooner, MD.   Pt is with acute concerns.  Allergic rhinitis: using astelin 1% spray very helpful., controlled.   Gerd: prilosec once daily at 40 mg . Sometimes at night time depending on what she ate.   Blockage of coronary artery of heart: was evaluated by cardiology, ws supposed to be seen every three months, and went in and decided wasn't going to keep going in so decided to have pcp take over. Hasn't been closely checked since. Will occasionally get some chest pain, which she describes as pretty quick, happens once every week or so but quickly goes away.    Hyperlipidemia: taking crestor 20 mg nightly, doing well. Denies myalgias.  Lab Results  Component Value Date   CHOL 172 05/09/2022   HDL 52.10 05/09/2022   LDLCALC 81 05/09/2022   TRIG 198.0 (H) 05/09/2022   CHOLHDL 3 05/09/2022    DM2: tried to fill jardiance but too expensive, this was for the 10 mg dosing. Did try metformin in the past and was helpful , and diabetes was controlled however was taken off of this  Lab Results  Component Value Date   HGBA1C 7.0 (H) 05/09/2022     Past Medical History:  Diagnosis Date   Arthritis    Asthma    Blockage of coronary artery of heart (HCC)    50% blockage single vessels   Claustrophobia    COPD (chronic obstructive pulmonary disease) ([place])    Diabetes mellitus without complication ([place])    Diverticular disease of colon 2010   GERD (gastroesophageal reflux disease)    Hypercholesteremia    Hypertension    Osteoarthritis    Pre-diabetes    Seasonal allergies     Past Surgical History:  Procedure Laterality Date   ABLATION ON ENDOMETRIOSIS   1976   ANTERIOR CERVICAL DECOMP/DISCECTOMY FUSION  04/20/2014   Procedure: ANTERIOR CERVICAL DECOMPRESSION/DISCECTOMY FUSION 2 LEVELS;  Surgeon: Sinclair Ship, MD;  Location: [facility];  Service: Orthopedics;;  Anterior cervical decompression fusion, cervical 4-5, cervical 5-6 with instrumentation and allograft, possible C4 corpectomy.   CARDIAC CATHETERIZATION  2010   [facility]; Dr. 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   CHOLECYSTECTOMY  2011   COLON RESECTION     for diverticulitis   COLON SURGERY Left 2010   COLONOSCOPY WITH PROPOFOL N/A 07/08/2017   Procedure: COLONOSCOPY WITH PROPOFOL;  Surgeon: Manya Silvas, MD;  Location: [facility] ENDOSCOPY;  Service: Endoscopy;  Laterality: N/A;   COLONOSCOPY WITH PROPOFOL N/A 11/04/2021   Procedure: COLONOSCOPY WITH PROPOFOL;  Surgeon: Annamaria Helling, DO;  Location: [facility] ENDOSCOPY;  Service: Gastroenterology;  Laterality: N/A;   FOOT SURGERY  1995   laser capsulotomy  11/2019    Family History  Problem Relation Age of Onset   Heart disease Mother    Cancer Mother        mets to lung, primary unclear   Heart disease Father    Lung cancer Brother  lung   Hyperlipidemia Brother    Hypertension Brother    Breast cancer Paternal Grandmother     Social History   Socioeconomic History   Marital status: Single    Spouse name: Not on file   Number of children: 1   Years of education: community college   Highest education level: Not on file  Occupational History   Not on file  Tobacco Use   Smoking status: Former    Packs/day: 0.50    Years: 30.00    Total pack years: 15.00    Types: Cigarettes    Quit date: 09/30/2007    Years since quitting: 14.8   Smokeless tobacco: Never  Vaping Use   Vaping Use: Never used  Substance and Sexual Activity    Alcohol use: Yes    Alcohol/week: 0.0 standard drinks of alcohol    Comment: 1 glass of wine daily   Drug use: No   Sexual activity: Not Currently  Other Topics Concern   Not on file  Social History Narrative   08/30/20   From: the area   Living: alone   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      Family: daughter - Nira Conn - lives in [place]      Enjoys: difficult with her work hours, dinner with friends, play cards, shop      Exercise: not currently   Diet: tries to follow diabetic diet, limits sweets and fried foods      Safety   Seat belts: Yes    Guns: No   Safe in relationships: Yes    Social Determinants of Radio broadcast assistant Strain: Not on file  Food Insecurity: Not on file  Transportation Needs: Not on file  Physical Activity: Not on file  Stress: Not on file  Social Connections: Not on file  Intimate Partner Violence: Not on file    Outpatient Medications Prior to Visit  Medication Sig Dispense Refill   acetaminophen (TYLENOL) 500 MG tablet Take 500 mg by mouth daily as needed.     albuterol (VENTOLIN HFA) 108 (90 Base) MCG/ACT inhaler Inhale into the lungs every 4 (four) hours as needed for wheezing or shortness of breath.     azelastine (ASTELIN) 0.1 % nasal spray Place 2 sprays into both nostrils 2 (two) times daily. 30 mL 1   Budeson-Glycopyrrol-Formoterol (BREZTRI AEROSPHERE) 160-9-4.8 MCG/ACT AERO Inhale 2 puffs into the lungs in the morning and at bedtime. 32.1 g 3   cetirizine (ZYRTEC) 10 MG tablet Take 10 mg by mouth at bedtime.     glucose blood test strip Check blood sugar once daily and as directed with onetouch ultra blue test strips. Dx E11.9 100 each 2   hydrochlorothiazide (HYDRODIURIL) 25 MG tablet Take 1 tablet (25 mg total) by mouth daily. 90 tablet 3   metoprolol succinate (TOPROL-XL) 25 MG 24 hr tablet Take 1 tablet (25 mg total) by mouth daily. 90 tablet 3   omeprazole (PRILOSEC) 40 MG capsule TAKE 1  CAPSULE DAILY 90 capsule 3   ONETOUCH DELICA LANCETS 63O MISC Check blood sugar once daily and as directed. Dx E11.9 100 each 0   rosuvastatin (CRESTOR) 20 MG tablet Take 1 tablet (20 mg total) by mouth daily. for cholesterol 90 tablet 0   Spacer/Aero-Holding Chambers (AEROCHAMBER MV) inhaler Use as instructed 1 each 0   triamcinolone cream (KENALOG) 0.1 % Apply 1 Application topically as needed (dry skin). 30 g 0  empagliflozin (JARDIANCE) 10 MG TABS tablet Take 1 tablet (10 mg total) by mouth daily before breakfast. 90 tablet 0   OVER THE COUNTER MEDICATION Phason- otc gas relief- prn     No facility-administered medications prior to visit.    Allergies  Allergen Reactions   Influenza Vaccines     Muscle cramps, shaking   Trelegy Ellipta [Fluticasone-Umeclidin-Vilant] Hives    Itching in face/neck          Objective:    Physical Exam Constitutional:      General: She is not in acute distress.    Appearance: Normal appearance. She is not ill-appearing.  HENT:     Right Ear: Tympanic membrane normal.     Left Ear: Tympanic membrane normal.     Nose: Nose normal. No congestion or rhinorrhea.     Right Turbinates: Not enlarged or swollen.     Left Turbinates: Not enlarged or swollen.     Right Sinus: No maxillary sinus tenderness or frontal sinus tenderness.     Left Sinus: No maxillary sinus tenderness or frontal sinus tenderness.     Mouth/Throat:     Mouth: Mucous membranes are moist.     Pharynx: No pharyngeal swelling, oropharyngeal exudate or posterior oropharyngeal erythema.     Tonsils: No tonsillar exudate.  Eyes:     Extraocular Movements: Extraocular movements intact.     Conjunctiva/sclera: Conjunctivae normal.     Pupils: Pupils are equal, round, and reactive to light.  Neck:     Thyroid: No thyroid mass.  Cardiovascular:     Rate and Rhythm: Normal rate and regular rhythm.     Pulses:          Dorsalis pedis pulses are 2+ on the right side and 2+ on the  left side.       Posterior tibial pulses are 2+ on the right side and 2+ on the left side.     Heart sounds: Normal heart sounds.     Comments: No bruits Pulmonary:     Effort: Pulmonary effort is normal.     Breath sounds: Normal breath sounds.  Musculoskeletal:     Right lower leg: No edema.     Left lower leg: No edema.  Lymphadenopathy:     Cervical:     Right cervical: No superficial cervical adenopathy.    Left cervical: No superficial cervical adenopathy.  Neurological:     Mental Status: She is alert.       BP 132/60   Pulse 63   Temp 98 F (36.7 C)   Resp 16   Ht 5' 1.75" (1.568 m)   Wt 142 lb 2 oz (64.5 kg)   SpO2 97%   BMI 26.21 kg/m  Wt Readings from Last 3 Encounters:  08/11/22 142 lb 2 oz (64.5 kg)  05/09/22 138 lb 8 oz (62.8 kg)  11/04/21 133 lb (60.3 kg)     Health Maintenance Due  Topic Date Due   Zoster Vaccines- Shingrix (1 of 2) Never done   Pneumonia Vaccine 37+ Years old (1 - PCV) Never done   FOOT EXAM  08/30/2021    There are no preventive care reminders to display for this patient.  Lab Results  Component Value Date   TSH 1.84 03/16/2019   Lab Results  Component Value Date   WBC 7.2 08/30/2020   HGB 14.4 08/30/2020   HCT 43.5 08/30/2020   MCV 93.6 08/30/2020   PLT 253.0 08/30/2020   Lab  Results  Component Value Date   NA 140 05/09/2022   K 3.8 05/09/2022   CO2 32 05/09/2022   GLUCOSE 108 (H) 05/09/2022   BUN 20 05/09/2022   CREATININE 0.83 05/09/2022   BILITOT 1.1 05/09/2022   ALKPHOS 79 05/09/2022   AST 26 05/09/2022   ALT 24 05/09/2022   PROT 6.8 05/09/2022   ALBUMIN 4.5 05/09/2022   CALCIUM 9.1 05/09/2022   ANIONGAP 12 04/18/2014   GFR 70.40 05/09/2022   Lab Results  Component Value Date   CHOL 172 05/09/2022   Lab Results  Component Value Date   HDL 52.10 05/09/2022   Lab Results  Component Value Date   LDLCALC 81 05/09/2022   Lab Results  Component Value Date   TRIG 198.0 (H) 05/09/2022   Lab  Results  Component Value Date   CHOLHDL 3 05/09/2022   Lab Results  Component Value Date   HGBA1C 7.0 (H) 05/09/2022      Assessment & Plan:   Problem List Items Addressed This Visit       Cardiovascular and Mediastinum   Blockage of coronary artery of heart (HCC)    Unable to locate procedures or imaging or office notes from cardiologist, pt states about ten years ago. Sending request for records for Dr. Tonye Becket office, cardiology at [facility]. If unable to obtain, we will consider referral again to cardiology. Consider EKG at next visit.         Respiratory   COPD Grade C    Continue f/u with pulmonary as scheduled.  Continue inhaler as prescribed.      Allergic rhinitis    Continue astelin 1% spray        Endocrine   Controlled type 2 diabetes mellitus without complication, without long-term current use of insulin ([place]) - Primary    Ordered hga1c today pending results. Work on diabetic diet and exercise as tolerated. Yearly foot exam, and annual eye exam.   Jardiance too expensive, advised pt to restart metformin 500 mg bid as tolerated well in past.         Relevant Medications   metFORMIN (GLUCOPHAGE) 500 MG tablet   metFORMIN (GLUCOPHAGE) 500 MG tablet   Other Relevant Orders   Basic metabolic panel     Other   HLD (hyperlipidemia)    Pt is fasting.  Ordered lipid panel, pending results. Work on low cholesterol diet and exercise as tolerated Continue crestor 20 mg       Relevant Orders   Lipid panel   History of colon polyps    Meds ordered this encounter  Medications   metFORMIN (GLUCOPHAGE) 500 MG tablet    Sig: Take 1 tablet (500 mg total) by mouth 2 (two) times daily with a meal.    Dispense:  60 tablet    Refill:  0    Order Specific Question:   Supervising Provider    Answer:   BEDSOLE, AMY E [[id number]]   metFORMIN (GLUCOPHAGE) 500 MG tablet    Sig: Take 1 tablet (500 mg total) by mouth 2 (two) times daily with a meal.    Dispense:  180  tablet    Refill:  1    Order Specific Question:   Supervising Provider    Answer:   BEDSOLE, AMY E [[id number]]    Follow-up: Return in about 3 months (around 11/11/2022) for f/u diabetes.    Eugenia Pancoast, FNP

## 2022-08-11 NOTE — Assessment & Plan Note (Signed)
Pt is fasting.  Ordered lipid panel, pending results. Work on low cholesterol diet and exercise as tolerated Continue crestor 20 mg

## 2022-08-11 NOTE — Assessment & Plan Note (Signed)
Continue astelin 1% spray

## 2022-08-11 NOTE — Assessment & Plan Note (Signed)
Continue f/u with pulmonary as scheduled.  Continue inhaler as prescribed.

## 2022-08-11 NOTE — Patient Instructions (Signed)
Welcome to our clinic, I am happy to have you as my new patient. I am excited to continue on this healthcare journey with you.  Stop by the lab prior to leaving today. I will notify you of your results once received.   Please keep in mind Any my chart messages you send have up to a three business day turnaround for a response.  Phone calls may take up to a one full business day turnaround for a  response.   If you need a medication refill I recommend you request it through the pharmacy as this is easiest for [place] rather than sending a message and or phone call.   Due to recent changes in healthcare laws, you may see results of your imaging and/or laboratory studies on MyChart before I have had a chance to review them.  I understand that in some cases there may be results that are confusing or concerning to you. Please understand that not all results are received at the same time and often I may need to interpret multiple results in order to provide you with the best plan of care or course of treatment. Therefore, I ask that you please give me 2 business days to thoroughly review all your results before contacting my office for clarification. Should we see a critical lab result, you will be contacted sooner.   It was a pleasure seeing you today! Please do not hesitate to reach out with any questions and or concerns.  Regards,   Eugenia Pancoast FNP-C

## 2022-09-01 ENCOUNTER — Other Ambulatory Visit: Payer: Self-pay

## 2022-09-01 DIAGNOSIS — I10 Essential (primary) hypertension: Secondary | ICD-10-CM

## 2022-09-01 NOTE — Telephone Encounter (Signed)
Last visit 08/11/2022 Next 11/12/2021 Last refill 09/20/2021

## 2022-09-02 MED ORDER — HYDROCHLOROTHIAZIDE 25 MG PO TABS: 25.0000 mg | ORAL_TABLET | Freq: Every day | ORAL | 3 refills | Status: DC

## 2022-09-02 MED ORDER — METOPROLOL SUCCINATE ER 25 MG PO TB24: 25.0000 mg | ORAL_TABLET | Freq: Every day | ORAL | 3 refills | Status: DC

## 2022-09-10 ENCOUNTER — Ambulatory Visit: Payer: Medicare Other | Admitting: Emergency Medicine

## 2022-09-10 ENCOUNTER — Encounter: Payer: Self-pay | Admitting: Emergency Medicine

## 2022-09-10 VITALS — BP 126/70 | HR 69 | Temp 98.0°F | Ht 62.0 in | Wt 143.6 lb

## 2022-09-10 DIAGNOSIS — J449 Chronic obstructive pulmonary disease, unspecified: Secondary | ICD-10-CM | POA: Diagnosis not present

## 2022-09-10 NOTE — Patient Instructions (Addendum)
Please continue Breztri 2 puffs twice a day.  Rinse and gargle after using. Keep albuterol available to use 2 puffs up to every 4 hours if needed for shortness of breath, chest tightness, wheezing.  We will refill this for you today. Agree with wearing a mask while at work.  Careful with handwashing. Agree with slowly and steadily increasing your exercise and conditioning.  This will help your overall breathing and functional capacity. Follow with Dr. Lamonte Sakai in 12 months or sooner if you have any problems.

## 2022-09-10 NOTE — Progress Notes (Signed)
Subjective:    Patient ID: Monique Sanchez, female    DOB: Feb 26, 1951, 71 y.o.   MRN: [id number]  HPI 71 year old former smoker (15-20 pack years) with a history of CAD, hypertension, hyperlipidemia, prediabetes, seasonal allergies.  She is been followed in our office by Dr. [place] Bells for COPD/asthma.  She is here to reestablish care. She reports that she has baseline exertional SOB with vacuuming, housework. She can walk at slower pace, is able to shop. No real cough. She does have a lot nasal congestion and clear drainage esp in the am. She has astelin but uses rarely. On zyrtec. She is on symbicort and spiriva, has tried trelegy before but she had a rash from it. Infrequent flares. She did have a flare in June after allergies flared and dog exposure. She uses albuterol very rarely.  She has not had the COVID vaccines, had GBS with the flu shot and has been cautioned against it.   Spirometry 01/31/2014 reviewed by me, shows very severe obstruction with an FEV1 of 0.5 L (24% predicted).   ROV 09/10/22 --71 year old woman with a history severe obstructive lung disease from COPD/asthma.  Also with history of CAD, hypertension, hyperlipidemia, seasonal allergies with some associated cough.  She has been managed on Breztri for the last year.  She unfortunately had COVID-19 about 1 year ago. She has been doing fairly well - has been active, still works part time. She does get fatigued late in the day. She can be limited by breathing w heavier exertion. Can happen w house work and cleaning. No real cough, no mucous. No flares since last year. She needs a new albuterol - uses about 1x a day.    Review of Systems As per HPI  Past Medical History:  Diagnosis Date   Arthritis    Asthma    Blockage of coronary artery of heart (HCC)    50% blockage single vessels   Claustrophobia    COPD (chronic obstructive pulmonary disease) (HCC)    Diabetes mellitus without complication (HCC)    Diverticular  disease of colon 2010   GERD (gastroesophageal reflux disease)    Hypercholesteremia    Hypertension    Osteoarthritis    Pre-diabetes    Seasonal allergies      Family History  Problem Relation Age of Onset   Heart disease Mother    Cancer Mother        mets to lung, primary unclear   Heart disease Father    Lung cancer Brother        lung   Hyperlipidemia Brother    Hypertension Brother    Breast cancer Paternal Grandmother      Social History   Socioeconomic History   Marital status: Single    Spouse name: Not on file   Number of children: 1   Years of education: community college   Highest education level: Not on file  Occupational History   Not on file  Tobacco Use   Smoking status: Former    Packs/day: 0.50    Years: 30.00    Total pack years: 15.00    Types: Cigarettes    Quit date: 09/30/2007    Years since quitting: 14.9   Smokeless tobacco: Never  Vaping Use   Vaping Use: Never used  Substance and Sexual Activity   Alcohol use: Yes    Alcohol/week: 0.0 standard drinks of alcohol    Comment: 1 glass of wine daily   Drug use: No  Sexual activity: Not Currently  Other Topics Concern   Not on file  Social History Narrative   08/30/20   From: the area   Living: alone   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      Family: daughter - Nira Conn - lives in [place]      Enjoys: difficult with her work hours, dinner with friends, play cards, shop      Exercise: not currently   Diet: tries to follow diabetic diet, limits sweets and fried foods      Safety   Seat belts: Yes    Guns: No   Safe in relationships: Yes    Social Determinants of Radio broadcast assistant Strain: Not on file  Food Insecurity: Not on file  Transportation Needs: Not on file  Physical Activity: Not on file  Stress: Not on file  Social Connections: Not on file  Intimate Partner Violence: Not on file    She is an optician, 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  Rarely some chemicals.  Has lived in [place]  No pets.   Allergies  Allergen Reactions   Influenza Vaccines     Muscle cramps, shaking   Trelegy Ellipta [Fluticasone-Umeclidin-Vilant] Hives    Itching in face/neck     Outpatient Medications Prior to Visit  Medication Sig Dispense Refill   acetaminophen (TYLENOL) 500 MG tablet Take 500 mg by mouth daily as needed.     albuterol (VENTOLIN HFA) 108 (90 Base) MCG/ACT inhaler Inhale into the lungs every 4 (four) hours as needed for wheezing or shortness of breath.     azelastine (ASTELIN) 0.1 % nasal spray Place 2 sprays into both nostrils 2 (two) times daily. 30 mL 1   Budeson-Glycopyrrol-Formoterol (BREZTRI AEROSPHERE) 160-9-4.8 MCG/ACT AERO Inhale 2 puffs into the lungs in the morning and at bedtime. 32.1 g 3   cetirizine (ZYRTEC) 10 MG tablet Take 10 mg by mouth at bedtime.     glucose blood test strip Check blood sugar once daily and as directed with onetouch ultra blue test strips. Dx E11.9 100 each 2   hydrochlorothiazide (HYDRODIURIL) 25 MG tablet Take 1 tablet (25 mg total) by mouth daily. 90 tablet 3   metFORMIN (GLUCOPHAGE) 500 MG tablet Take 1 tablet (500 mg total) by mouth 2 (two) times daily with a meal. 60 tablet 0   metFORMIN (GLUCOPHAGE) 500 MG tablet Take 1 tablet (500 mg total) by mouth 2 (two) times daily with a meal. 180 tablet 1   metoprolol succinate (TOPROL-XL) 25 MG 24 hr tablet Take 1 tablet (25 mg total) by mouth daily. 90 tablet 3   omeprazole (PRILOSEC) 40 MG capsule TAKE 1 CAPSULE DAILY 90 capsule 3   ONETOUCH DELICA LANCETS 95A MISC Check blood sugar once daily and as directed. Dx E11.9 100 each 0   rosuvastatin (CRESTOR) 20 MG tablet Take 1 tablet (20 mg total) by mouth daily. for cholesterol 90 tablet 0   Spacer/Aero-Holding Chambers (AEROCHAMBER MV) inhaler Use as instructed 1 each 0   triamcinolone cream (KENALOG) 0.1 % Apply 1 Application topically as needed (dry skin). 30 g 0   No facility-administered  medications prior to visit.         Objective:   Physical Exam Vitals:   09/10/22 1125  BP: 126/70  Pulse: 69  Temp: 98 F (36.7 C)  TempSrc: Oral  SpO2: 95%  Weight: 143 lb 9.6 oz (65.1 kg)  Height: '5\' 2"'$  (1.575 m)   Gen: Pleasant,  well-nourished, in no distress,  normal affect  ENT: No lesions,  mouth clear,  oropharynx clear, no postnasal drip  Neck: No JVD, no stridor  Lungs: No use of accessory muscles, distant, no crackles or wheezing on normal respiration, no wheeze on forced expiration  Cardiovascular: RRR, heart sounds normal, no murmur or gallops, no peripheral edema  Musculoskeletal: No deformities, no cyanosis or clubbing  Neuro: alert, awake, non focal  Skin: Cool, no lesions or rash     Assessment & Plan:  COPD Grade C Overall doing quite well.  Very functional despite severe obstructive lung disease.  She may be under using her albuterol and we talked about trying to use it more frequently when she has exertional shortness of breath.  No flares.  She cannot get vaccinated because she had GBS with the flu shot several years ago.  She does wear a mask and practices good hand hygiene when she is working.   Please continue Breztri 2 puffs twice a day.  Rinse and gargle after using. Keep albuterol available to use 2 puffs up to every 4 hours if needed for shortness of breath, chest tightness, wheezing.  We will refill this for you today. Agree with wearing a mask while at work.  Careful with handwashing. Agree with slowly and steadily increasing your exercise and conditioning.  This will help your overall breathing and functional capacity. Follow with Dr. Lamonte Sakai in 12 months or sooner if you have any problems.    Baltazar Apo, MD, PhD 09/10/2022, 11:48 AM [facility] [phone] or if no answer before 7:00PM call [phone] For any issues after 7:00PM please call eLink [phone]

## 2022-09-10 NOTE — Assessment & Plan Note (Signed)
Overall doing quite well.  Very functional despite severe obstructive lung disease.  She may be under using her albuterol and we talked about trying to use it more frequently when she has exertional shortness of breath.  No flares.  She cannot get vaccinated because she had GBS with the flu shot several years ago.  She does wear a mask and practices good hand hygiene when she is working.   Please continue Breztri 2 puffs twice a day.  Rinse and gargle after using. Keep albuterol available to use 2 puffs up to every 4 hours if needed for shortness of breath, chest tightness, wheezing.  We will refill this for you today. Agree with wearing a mask while at work.  Careful with handwashing. Agree with slowly and steadily increasing your exercise and conditioning.  This will help your overall breathing and functional capacity. Follow with Dr. Lamonte Sakai in 12 months or sooner if you have any problems.

## 2022-09-11 ENCOUNTER — Telehealth: Payer: Self-pay | Admitting: Emergency Medicine

## 2022-09-11 MED ORDER — ALBUTEROL SULFATE HFA 108 (90 BASE) MCG/ACT IN AERS: 1.0000 | INHALATION_SPRAY | RESPIRATORY_TRACT | 4 refills | Status: DC | PRN

## 2022-09-11 NOTE — Telephone Encounter (Signed)
Called and left voicemail for patient as desired that I was sending in her albuterol refill as requested. Went over pharmacy. Nothing further needed

## 2022-10-08 ENCOUNTER — Encounter: Payer: Medicare Other | Admitting: Family

## 2022-10-10 ENCOUNTER — Encounter: Payer: Self-pay | Admitting: Family

## 2022-10-10 DIAGNOSIS — Z1231 Encounter for screening mammogram for malignant neoplasm of breast: Secondary | ICD-10-CM

## 2022-10-10 DIAGNOSIS — E782 Mixed hyperlipidemia: Secondary | ICD-10-CM

## 2022-10-10 MED ORDER — ROSUVASTATIN CALCIUM 20 MG PO TABS: 20.0000 mg | ORAL_TABLET | Freq: Every day | ORAL | 3 refills | Status: DC

## 2022-10-13 NOTE — Telephone Encounter (Signed)
Okay to place order for this?

## 2022-10-27 DIAGNOSIS — M7502 Adhesive capsulitis of left shoulder: Secondary | ICD-10-CM | POA: Diagnosis not present

## 2022-11-11 ENCOUNTER — Other Ambulatory Visit: Payer: Self-pay | Admitting: Emergency Medicine

## 2022-11-12 ENCOUNTER — Ambulatory Visit (INDEPENDENT_AMBULATORY_CARE_PROVIDER_SITE_OTHER): Payer: Medicare Other | Admitting: Family

## 2022-11-12 ENCOUNTER — Ambulatory Visit
Admission: RE | Admit: 2022-11-12 | Discharge: 2022-11-12 | Disposition: A | Discharge status: Home / Self Care | Payer: Medicare Other | Source: Ambulatory Visit | Attending: Family | Admitting: Family

## 2022-11-12 ENCOUNTER — Telehealth: Payer: Self-pay

## 2022-11-12 ENCOUNTER — Encounter: Payer: Self-pay | Admitting: Family

## 2022-11-12 VITALS — BP 118/62 | HR 78 | Temp 97.6°F | Ht 62.0 in | Wt 144.4 lb

## 2022-11-12 DIAGNOSIS — Z1231 Encounter for screening mammogram for malignant neoplasm of breast: Secondary | ICD-10-CM | POA: Insufficient documentation

## 2022-11-12 DIAGNOSIS — Z780 Asymptomatic menopausal state: Secondary | ICD-10-CM

## 2022-11-12 DIAGNOSIS — I6529 Occlusion and stenosis of unspecified carotid artery: Secondary | ICD-10-CM

## 2022-11-12 DIAGNOSIS — I240 Acute coronary thrombosis not resulting in myocardial infarction: Secondary | ICD-10-CM | POA: Diagnosis not present

## 2022-11-12 DIAGNOSIS — E119 Type 2 diabetes mellitus without complications: Secondary | ICD-10-CM

## 2022-11-12 DIAGNOSIS — L821 Other seborrheic keratosis: Secondary | ICD-10-CM | POA: Insufficient documentation

## 2022-11-12 DIAGNOSIS — E785 Hyperlipidemia, unspecified: Secondary | ICD-10-CM | POA: Diagnosis not present

## 2022-11-12 LAB — POCT GLYCOSYLATED HEMOGLOBIN (HGB A1C): Hemoglobin A1C: 7.1 % — AB (ref 4.0–5.6)

## 2022-11-12 MED ORDER — METFORMIN HCL 500 MG PO TABS: 500.0000 mg | ORAL_TABLET | Freq: Every day | ORAL | 0 refills | Status: DC

## 2022-11-12 MED ORDER — LINAGLIPTIN 5 MG PO TABS: 5.0000 mg | ORAL_TABLET | Freq: Every day | ORAL | 1 refills | Status: DC

## 2022-11-12 NOTE — Addendum Note (Signed)
Addended by: Ellamae Sia on: 11/12/2022 02:38 PM   Modules accepted: Orders

## 2022-11-12 NOTE — Progress Notes (Signed)
Established Patient Office Visit  Subjective:      CC:  Chief Complaint  Patient presents with   Medical Management of Chronic Issues    HPI: Monique Sanchez is a 72 y.o. female presenting on 11/12/2022 for Medical Management of Chronic Issues . DM2: tried to restart metformin 500 mg , tried twice daily however could not tolerate due to GI upset so still taking once daily just not twice. Trying to work on diet a bit.  Lab Results  Component Value Date   HGBA1C 7.1 (A) 11/12/2022   Wt Readings from Last 3 Encounters:  11/12/22 144 lb 6.4 oz (65.5 kg)  09/10/22 143 lb 9.6 oz (65.1 kg)  08/11/22 142 lb 2 oz (64.5 kg)   HLD: tolerating crestor well, no myalgias.   H/o blockage, however states can not remember if was carotid or in arteries, however does state had a cardiac cath which found the blockage, and per pt was at 50% however she can also not remember laterality. This was over ten y/o with cardiologist, no records to be found. Did request records but unlikely able to recover as has been over 7 years. Pt without increasing sob or DOE. Denies cp palp or sob.   Social history:  Relevant past medical, surgical, family and social history reviewed and updated as indicated. Interim medical history since our last visit reviewed.  Allergies and medications reviewed and updated.  DATA REVIEWED: CHART IN EPIC     ROS: Negative unless specifically indicated above in HPI.    Current Outpatient Medications:    acetaminophen (TYLENOL) 500 MG tablet, Take 500 mg by mouth daily as needed., Disp: , Rfl:    albuterol (VENTOLIN HFA) 108 (90 Base) MCG/ACT inhaler, Inhale 1-2 puffs into the lungs every 4 (four) hours as needed for wheezing or shortness of breath., Disp: 18 g, Rfl: 4   azelastine (ASTELIN) 0.1 % nasal spray, Place 2 sprays into both nostrils 2 (two) times daily., Disp: 30 mL, Rfl: 1   BREZTRI AEROSPHERE 160-9-4.8 MCG/ACT AERO, USE 2 INHALATIONS IN THE MORNING AND AT  BEDTIME, Disp: 32.1 g, Rfl: 3   cetirizine (ZYRTEC) 10 MG tablet, Take 10 mg by mouth at bedtime., Disp: , Rfl:    glucose blood test strip, Check blood sugar once daily and as directed with onetouch ultra blue test strips. Dx E11.9, Disp: 100 each, Rfl: 2   hydrochlorothiazide (HYDRODIURIL) 25 MG tablet, Take 1 tablet (25 mg total) by mouth daily., Disp: 90 tablet, Rfl: 3   linagliptin (TRADJENTA) 5 MG TABS tablet, Take 1 tablet (5 mg total) by mouth daily., Disp: 90 tablet, Rfl: 1   metoprolol succinate (TOPROL-XL) 25 MG 24 hr tablet, Take 1 tablet (25 mg total) by mouth daily., Disp: 90 tablet, Rfl: 3   omeprazole (PRILOSEC) 40 MG capsule, TAKE 1 CAPSULE DAILY, Disp: 90 capsule, Rfl: 3   ONETOUCH DELICA LANCETS 99991111 MISC, Check blood sugar once daily and as directed. Dx E11.9, Disp: 100 each, Rfl: 0   rosuvastatin (CRESTOR) 20 MG tablet, Take 1 tablet (20 mg total) by mouth daily. for cholesterol, Disp: 90 tablet, Rfl: 3   Spacer/Aero-Holding Chambers (AEROCHAMBER MV) inhaler, Use as instructed, Disp: 1 each, Rfl: 0   triamcinolone cream (KENALOG) 0.1 %, Apply 1 Application topically as needed (dry skin)., Disp: 30 g, Rfl: 0   metFORMIN (GLUCOPHAGE) 500 MG tablet, Take 1 tablet (500 mg total) by mouth daily., Disp: 90 tablet, Rfl: 0  Objective:    BP 118/62   Pulse 78   Temp 97.6 F (36.4 C) (Temporal)   Ht 5' 2"$  (1.575 m)   Wt 144 lb 6.4 oz (65.5 kg)   SpO2 98%   BMI 26.41 kg/m   Wt Readings from Last 3 Encounters:  11/12/22 144 lb 6.4 oz (65.5 kg)  09/10/22 143 lb 9.6 oz (65.1 kg)  08/11/22 142 lb 2 oz (64.5 kg)    Physical Exam Constitutional:      General: She is not in acute distress.    Appearance: Normal appearance. She is normal weight. She is not ill-appearing, toxic-appearing or diaphoretic.  HENT:     Head: Normocephalic.  Cardiovascular:     Rate and Rhythm: Normal rate and regular rhythm.     Pulses: Normal pulses.     Heart sounds: No murmur heard.     Comments: No cacrotid bruits ausculated Pulmonary:     Effort: Pulmonary effort is normal.  Musculoskeletal:        General: Normal range of motion.     Right lower leg: No edema.     Left lower leg: No edema.  Neurological:     General: No focal deficit present.     Mental Status: She is alert and oriented to person, place, and time. Mental status is at baseline.  Psychiatric:        Mood and Affect: Mood normal.        Behavior: Behavior normal.        Thought Content: Thought content normal.        Judgment: Judgment normal.            Assessment & Plan:  Blockage of coronary artery of heart (HCC) Assessment & Plan: Suspected to be carotid, poor history to unable to determine and unable to obtain past records as > 10 y/o suspect carotid blockage, so will order u/s carotid arteries. If negative will consider CTA.   Orders: -     VAS US CAROTID; Future  Controlled type 2 diabetes mellitus without complication, without long-term current use of insulin (HCC) Assessment & Plan: Continue metformin 500 mg once daily.  Poc A1c today at 7.1  Will try tradjenta with her insurance to see if more cost affordable.   Orders: -     metFORMIN HCl; Take 1 tablet (500 mg total) by mouth daily.  Dispense: 90 tablet; Refill: 0 -     POCT glycosylated hemoglobin (Hb A1C) -     Microalbumin / creatinine urine ratio -     linaGLIPtin; Take 1 tablet (5 mg total) by mouth daily.  Dispense: 90 tablet; Refill: 1  Stenosis of carotid artery, unspecified laterality -     VAS US CAROTID; Future  Hyperlipidemia, unspecified hyperlipidemia type Assessment & Plan: Continue crestor  Continue low chol diet and exercise as tolerated.      Return in about 6 months (around 05/13/2023) for f/u diabetes.  Eugenia Pancoast, MSN, APRN, FNP-C [place]

## 2022-11-12 NOTE — Assessment & Plan Note (Signed)
Suspected to be carotid, poor history to unable to determine and unable to obtain past records as > 73 y/o suspect carotid blockage, so will order u/s carotid arteries. If negative will consider CTA.

## 2022-11-12 NOTE — Assessment & Plan Note (Signed)
Continue metformin 500 mg once daily.  Poc A1c today at 7.1  Will try tradjenta with her insurance to see if more cost affordable.

## 2022-11-12 NOTE — Assessment & Plan Note (Signed)
Continue crestor  Continue low chol diet and exercise as tolerated.

## 2022-11-12 NOTE — Telephone Encounter (Signed)
Patient forgot to mention during her visit today, to refer her for a Bone Density that's due after December 07, 2022.

## 2022-11-12 NOTE — Patient Instructions (Addendum)
Your A1c is 7.1 I am going to try to send in another medication called tradjenta which is in pill form you will take once daily. Please let me know via mychart if this is too expensive and I will try another medication.   Referral placed for ultrasound carotid arteries, you should receive a call to set this up.    Regards,   Eugenia Pancoast FNP-C

## 2022-12-03 ENCOUNTER — Ambulatory Visit (INDEPENDENT_AMBULATORY_CARE_PROVIDER_SITE_OTHER): Payer: Medicare Other

## 2022-12-03 DIAGNOSIS — I6529 Occlusion and stenosis of unspecified carotid artery: Secondary | ICD-10-CM

## 2022-12-03 DIAGNOSIS — I240 Acute coronary thrombosis not resulting in myocardial infarction: Secondary | ICD-10-CM

## 2022-12-08 DIAGNOSIS — M7502 Adhesive capsulitis of left shoulder: Secondary | ICD-10-CM | POA: Diagnosis not present

## 2023-03-02 DIAGNOSIS — L670 Trichorrhexis nodosa: Secondary | ICD-10-CM | POA: Diagnosis not present

## 2023-03-16 ENCOUNTER — Encounter: Payer: Self-pay | Admitting: Family

## 2023-03-16 ENCOUNTER — Ambulatory Visit
Admission: RE | Admit: 2023-03-16 | Discharge: 2023-03-16 | Disposition: A | Discharge status: Home / Self Care | Payer: Medicare Other | Source: Ambulatory Visit | Attending: Family | Admitting: Family

## 2023-03-16 DIAGNOSIS — M816 Localized osteoporosis [Lequesne]: Secondary | ICD-10-CM

## 2023-03-16 DIAGNOSIS — Z780 Asymptomatic menopausal state: Secondary | ICD-10-CM

## 2023-03-16 DIAGNOSIS — M810 Age-related osteoporosis without current pathological fracture: Secondary | ICD-10-CM | POA: Diagnosis not present

## 2023-03-18 MED ORDER — ALENDRONATE SODIUM 70 MG PO TABS
70.0000 mg | ORAL_TABLET | ORAL | 3 refills | Status: DC
Start: 2023-03-18 — End: 2023-05-08

## 2023-05-07 ENCOUNTER — Encounter: Payer: Self-pay | Admitting: Family

## 2023-05-18 ENCOUNTER — Ambulatory Visit (INDEPENDENT_AMBULATORY_CARE_PROVIDER_SITE_OTHER): Payer: Medicare Other

## 2023-05-18 VITALS — Wt 144.0 lb

## 2023-05-18 DIAGNOSIS — Z0000 Encounter for general adult medical examination without abnormal findings: Secondary | ICD-10-CM | POA: Diagnosis not present

## 2023-05-18 NOTE — Patient Instructions (Signed)
Monique Sanchez , Thank you for taking time to come for your Medicare Wellness Visit. I appreciate your ongoing commitment to your health goals. Please review the following plan we discussed and let me know if I can assist you in the future.   Referrals/Orders/Follow-Ups/Clinician Recommendations: maintain health and activity   This is a list of the screening recommended for you and due dates:  Health Maintenance  Topic Date Due   Zoster (Shingles) Vaccine (1 of 2) Never done   Complete foot exam   08/30/2021   Yearly kidney health urinalysis for diabetes  05/10/2023   Medicare Annual Wellness Visit  05/10/2023   Hemoglobin A1C  05/13/2023   Pneumonia Vaccine (1 of 1 - PCV) 11/13/2023*   Eye exam for diabetics  06/17/2023   Yearly kidney function blood test for diabetes  08/12/2023   DTaP/Tdap/Td vaccine (2 - Td or Tdap) 03/01/2024   Mammogram  11/12/2024   Colon Cancer Screening  11/05/2031   DEXA scan (bone density measurement)  Completed   HPV Vaccine  Aged Out   COVID-19 Vaccine  Discontinued   Hepatitis C Screening  Discontinued  *Topic was postponed. The date shown is not the original due date.    Advanced directives: (Declined) Advance directive discussed with you today. Even though you declined this today, please call our office should you change your mind, and we can give you the proper paperwork for you to fill out.  Next Medicare Annual Wellness Visit scheduled for next year: Yes  Preventive Care 66 Years and Older, Female Preventive care refers to lifestyle choices and visits with your health care provider that can promote health and wellness. What does preventive care include? A yearly physical exam. This is also called an annual well check. Dental exams once or twice a year. Routine eye exams. Ask your health care provider how often you should have your eyes checked. Personal lifestyle choices, including: Daily care of your teeth and gums. Regular physical  activity. Eating a healthy diet. Avoiding tobacco and drug use. Limiting alcohol use. Practicing safe sex. Taking low-dose aspirin every day. Taking vitamin and mineral supplements as recommended by your health care provider. What happens during an annual well check? The services and screenings done by your health care provider during your annual well check will depend on your age, overall health, lifestyle risk factors, and family history of disease. Counseling  Your health care provider may ask you questions about your: Alcohol use. Tobacco use. Drug use. Emotional well-being. Home and relationship well-being. Sexual activity. Eating habits. History of falls. Memory and ability to understand (cognition). Work and work Astronomer. Reproductive health. Screening  You may have the following tests or measurements: Height, weight, and BMI. Blood pressure. Lipid and cholesterol levels. These may be checked every 5 years, or more frequently if you are over 19 years old. Skin check. Lung cancer screening. You may have this screening every year starting at age 36 if you have a 30-pack-year history of smoking and currently smoke or have quit within the past 15 years. Fecal occult blood test (FOBT) of the stool. You may have this test every year starting at age 19. Flexible sigmoidoscopy or colonoscopy. You may have a sigmoidoscopy every 5 years or a colonoscopy every 10 years starting at age 95. Hepatitis C blood test. Hepatitis B blood test. Sexually transmitted disease (STD) testing. Diabetes screening. This is done by checking your blood sugar (glucose) after you have not eaten for a while (fasting). You  may have this done every 1-3 years. Bone density scan. This is done to screen for osteoporosis. You may have this done starting at age 40. Mammogram. This may be done every 1-2 years. Talk to your health care provider about how often you should have regular mammograms. Talk with your  health care provider about your test results, treatment options, and if necessary, the need for more tests. Vaccines  Your health care provider may recommend certain vaccines, such as: Influenza vaccine. This is recommended every year. Tetanus, diphtheria, and acellular pertussis (Tdap, Td) vaccine. You may need a Td booster every 10 years. Zoster vaccine. You may need this after age 98. Pneumococcal 13-valent conjugate (PCV13) vaccine. One dose is recommended after age 55. Pneumococcal polysaccharide (PPSV23) vaccine. One dose is recommended after age 59. Talk to your health care provider about which screenings and vaccines you need and how often you need them. This information is not intended to replace advice given to you by your health care provider. Make sure you discuss any questions you have with your health care provider. Document Released: 10/12/2015 Document Revised: 06/04/2016 Document Reviewed: 07/17/2015 Elsevier Interactive Patient Education  2017 [employer].  Fall Prevention in the Home Falls can cause injuries. They can happen to people of all ages. There are many things you can do to make your home safe and to help prevent falls. What can I do on the outside of my home? Regularly fix the edges of walkways and driveways and fix any cracks. Remove anything that might make you trip as you walk through a door, such as a raised step or threshold. Trim any bushes or trees on the path to your home. Use bright outdoor lighting. Clear any walking paths of anything that might make someone trip, such as rocks or tools. Regularly check to see if handrails are loose or broken. Make sure that both sides of any steps have handrails. Any raised decks and porches should have guardrails on the edges. Have any leaves, snow, or ice cleared regularly. Use sand or salt on walking paths during winter. Clean up any spills in your garage right away. This includes oil or grease spills. What can I  do in the bathroom? Use night lights. Install grab bars by the toilet and in the tub and shower. Do not use towel bars as grab bars. Use non-skid mats or decals in the tub or shower. If you need to sit down in the shower, use a plastic, non-slip stool. Keep the floor dry. Clean up any water that spills on the floor as soon as it happens. Remove soap buildup in the tub or shower regularly. Attach bath mats securely with double-sided non-slip rug tape. Do not have throw rugs and other things on the floor that can make you trip. What can I do in the bedroom? Use night lights. Make sure that you have a light by your bed that is easy to reach. Do not use any sheets or blankets that are too big for your bed. They should not hang down onto the floor. Have a firm chair that has side arms. You can use this for support while you get dressed. Do not have throw rugs and other things on the floor that can make you trip. What can I do in the kitchen? Clean up any spills right away. Avoid walking on wet floors. Keep items that you use a lot in easy-to-reach places. If you need to reach something above you, use a strong  step stool that has a grab bar. Keep electrical cords out of the way. Do not use floor polish or wax that makes floors slippery. If you must use wax, use non-skid floor wax. Do not have throw rugs and other things on the floor that can make you trip. What can I do with my stairs? Do not leave any items on the stairs. Make sure that there are handrails on both sides of the stairs and use them. Fix handrails that are broken or loose. Make sure that handrails are as long as the stairways. Check any carpeting to make sure that it is firmly attached to the stairs. Fix any carpet that is loose or worn. Avoid having throw rugs at the top or bottom of the stairs. If you do have throw rugs, attach them to the floor with carpet tape. Make sure that you have a light switch at the top of the stairs  and the bottom of the stairs. If you do not have them, ask someone to add them for you. What else can I do to help prevent falls? Wear shoes that: Do not have high heels. Have rubber bottoms. Are comfortable and fit you well. Are closed at the toe. Do not wear sandals. If you use a stepladder: Make sure that it is fully opened. Do not climb a closed stepladder. Make sure that both sides of the stepladder are locked into place. Ask someone to hold it for you, if possible. Clearly mark and make sure that you can see: Any grab bars or handrails. First and last steps. Where the edge of each step is. Use tools that help you move around (mobility aids) if they are needed. These include: Canes. Walkers. Scooters. Crutches. Turn on the lights when you go into a dark area. Replace any light bulbs as soon as they burn out. Set up your furniture so you have a clear path. Avoid moving your furniture around. If any of your floors are uneven, fix them. If there are any pets around you, be aware of where they are. Review your medicines with your doctor. Some medicines can make you feel dizzy. This can increase your chance of falling. Ask your doctor what other things that you can do to help prevent falls. This information is not intended to replace advice given to you by your health care provider. Make sure you discuss any questions you have with your health care provider. Document Released: 07/12/2009 Document Revised: 02/21/2016 Document Reviewed: 10/20/2014 Elsevier Interactive Patient Education  2017 [employer].

## 2023-05-18 NOTE — Progress Notes (Signed)
Subjective:   Monique Sanchez is a 72 y.o. female who presents for Medicare Annual (Subsequent) preventive examination.  Visit Complete: Virtual  I connected with  Monique Sanchez on 05/18/23 by a audio enabled telemedicine application and verified that I am speaking with the correct person using two identifiers.  Patient Location: Home  Provider Location: Office/Clinic  I discussed the limitations of evaluation and management by telemedicine. The patient expressed understanding and agreed to proceed.  Patient Medicare AWV questionnaire was completed by the patient on 05/17/23; I have confirmed that all information answered by patient is correct and no changes since this date.    Vital Signs: Unable to obtain new vitals due to this being a telehealth visit.   Review of Systems     Cardiac Risk Factors include: advanced age (>31men, >64 women);hypertension;dyslipidemia;diabetes mellitus     Objective:    Today's Vitals   05/18/23 1607  Weight: 144 lb (65.3 kg)   Body mass index is 26.34 kg/m.     05/18/2023    4:12 PM 05/09/2022   10:10 AM 11/04/2021   11:35 AM 07/08/2017    9:19 AM 04/18/2014   12:34 PM  Advanced Directives  Does Patient Have a Medical Advance Directive? No No No No Patient does not have advance directive;Patient would not like information  Would patient like information on creating a medical advance directive? No - Patient declined Yes (MAU/Ambulatory/Procedural Areas - Information given) No - Patient declined No - Patient declined   Pre-existing out of facility DNR order (yellow form or pink MOST form)     No    Current Medications (verified) Outpatient Encounter Medications as of 05/18/2023  Medication Sig   acetaminophen (TYLENOL) 500 MG tablet Take 500 mg by mouth daily as needed.   albuterol (VENTOLIN HFA) 108 (90 Base) MCG/ACT inhaler Inhale 1-2 puffs into the lungs every 4 (four) hours as needed for wheezing or shortness of breath.    azelastine (ASTELIN) 0.1 % nasal spray Place 2 sprays into both nostrils 2 (two) times daily.   BREZTRI AEROSPHERE 160-9-4.8 MCG/ACT AERO USE 2 INHALATIONS IN THE MORNING AND AT BEDTIME   cetirizine (ZYRTEC) 10 MG tablet Take 10 mg by mouth at bedtime.   glucose blood test strip Check blood sugar once daily and as directed with onetouch ultra blue test strips. Dx E11.9   hydrochlorothiazide (HYDRODIURIL) 25 MG tablet Take 1 tablet (25 mg total) by mouth daily.   metoprolol succinate (TOPROL-XL) 25 MG 24 hr tablet Take 1 tablet (25 mg total) by mouth daily.   omeprazole (PRILOSEC) 40 MG capsule TAKE 1 CAPSULE DAILY   ONETOUCH DELICA LANCETS 33G MISC Check blood sugar once daily and as directed. Dx E11.9   rosuvastatin (CRESTOR) 20 MG tablet Take 1 tablet (20 mg total) by mouth daily. for cholesterol   Spacer/Aero-Holding Chambers (AEROCHAMBER MV) inhaler Use as instructed   triamcinolone cream (KENALOG) 0.1 % Apply 1 Application topically as needed (dry skin).   metFORMIN (GLUCOPHAGE) 500 MG tablet Take 1 tablet (500 mg total) by mouth daily.   [DISCONTINUED] linagliptin (TRADJENTA) 5 MG TABS tablet Take 1 tablet (5 mg total) by mouth daily.   No facility-administered encounter medications on file as of 05/18/2023.    Allergies (verified) Influenza vaccines, Trelegy ellipta [fluticasone-umeclidin-vilant], and Fosamax [alendronate]   History: Past Medical History:  Diagnosis Date   Arthritis    Asthma    Blockage of coronary artery of heart (HCC)    50%  blockage single vessels   Claustrophobia    COPD (chronic obstructive pulmonary disease) (HCC)    Diabetes mellitus without complication (HCC)    Diverticular disease of colon 2010   GERD (gastroesophageal reflux disease)    Hypercholesteremia    Hypertension    Osteoarthritis    Pre-diabetes    Seasonal allergies    Past Surgical History:  Procedure Laterality Date   ABLATION ON ENDOMETRIOSIS  1976   ANTERIOR CERVICAL  DECOMP/DISCECTOMY FUSION  04/20/2014   Procedure: ANTERIOR CERVICAL DECOMPRESSION/DISCECTOMY FUSION 2 LEVELS;  Surgeon: Emilee Hero, MD;  Location: [facility] OR;  Service: Orthopedics;;  Anterior cervical decompression fusion, cervical 4-5, cervical 5-6 with instrumentation and allograft, possible C4 corpectomy.   CARDIAC CATHETERIZATION  2010   [facility]; Dr. Cassie Freer   CATARACT EXTRACTION W/ INTRAOCULAR LENS IMPLANT Left 05/2014   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   CHOLECYSTECTOMY  2011   COLON RESECTION     for diverticulitis   COLON SURGERY Left 2010   COLONOSCOPY WITH PROPOFOL N/A 07/08/2017   Procedure: COLONOSCOPY WITH PROPOFOL;  Surgeon: Scot Jun, MD;  Location: [facility] ENDOSCOPY;  Service: Endoscopy;  Laterality: N/A;   COLONOSCOPY WITH PROPOFOL N/A 11/04/2021   Procedure: COLONOSCOPY WITH PROPOFOL;  Surgeon: Jaynie Collins, DO;  Location: [facility] ENDOSCOPY;  Service: Gastroenterology;  Laterality: N/A;   FOOT SURGERY  1995   laser capsulotomy  11/2019   Family History  Problem Relation Age of Onset   Heart disease Mother    Cancer Mother        mets to lung, primary unclear   Heart disease Father    Lung cancer Brother        lung   Hyperlipidemia Brother    Hypertension Brother    Breast cancer Paternal Grandmother    Social History   Socioeconomic History   Marital status: Single    Spouse name: Not on file   Number of children: 1   Years of education: community college   Highest education level: Not on file  Occupational History   Not on file  Tobacco Use   Smoking status: Former    Current packs/day: 0.00    Average packs/day: 0.5 packs/day for 30.0 years (15.0 ttl pk-yrs)    Types: Cigarettes    Start date: 09/29/1977    Quit date: 09/30/2007    Years since quitting: 15.6   Smokeless tobacco: Never  Vaping Use   Vaping status: Never Used   Substance and Sexual Activity   Alcohol use: Yes    Alcohol/week: 0.0 standard drinks of alcohol    Comment: 1 glass of wine daily   Drug use: No   Sexual activity: Not Currently  Other Topics Concern   Not on file  Social History Narrative   08/30/20   From: the area   Living: alone   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      Family: daughter - Herbert Seta - lives in [place]      Enjoys: difficult with her work hours, dinner with friends, play cards, shop      Exercise: not currently   Diet: tries to follow diabetic diet, limits sweets and fried foods      Safety   Seat belts: Yes    Guns: No   Safe in relationships: Yes    Social Determinants of Health  Financial Resource Strain: Low Risk  (05/17/2023)   Overall Financial Resource Strain (CARDIA)    Difficulty of Paying Living Expenses: Not hard at all  Food Insecurity: No Food Insecurity (05/17/2023)   Hunger Vital Sign    Worried About Running Out of Food in the Last Year: Never true    Ran Out of Food in the Last Year: Never true  Transportation Needs: No Transportation Needs (05/17/2023)   PRAPARE - Administrator, Civil Service (Medical): No    Lack of Transportation (Non-Medical): No  Physical Activity: Inactive (05/17/2023)   Exercise Vital Sign    Days of Exercise per Week: 0 days    Minutes of Exercise per Session: 0 min  Stress: No Stress Concern Present (05/17/2023)   [employer] of Occupational Health - Occupational Stress Questionnaire    Feeling of Stress : Not at all  Social Connections: Socially Isolated (05/17/2023)   Social Connection and Isolation Panel [NHANES]    Frequency of Communication with Friends and Family: More than three times a week    Frequency of Social Gatherings with Friends and Family: Twice a week    Attends Religious Services: Never    Database administrator or Organizations: No    Attends Banker Meetings: Never     Marital Status: Widowed    Tobacco Counseling Counseling given: Not Answered   Clinical Intake:  Pre-visit preparation completed: Yes  Pain : No/denies pain     BMI - recorded: 26.34 Nutritional Status: BMI 25 -29 Overweight Nutritional Risks: None Diabetes: Yes CBG done?: No Did pt. bring in CBG monitor from home?: No  How often do you need to have someone help you when you read instructions, pamphlets, or other written materials from your doctor or pharmacy?: 1 - Never  Interpreter Needed?: No  Information entered by :: Lanier Ensign, LPN   Activities of Daily Living    05/17/2023    8:53 PM  In your present state of health, do you have any difficulty performing the following activities:  Hearing? 0  Vision? 0  Difficulty concentrating or making decisions? 0  Walking or climbing stairs? 0  Dressing or bathing? 0  Doing errands, shopping? 0  Preparing Food and eating ? N  Using the Toilet? N  In the past six months, have you accidently leaked urine? N  Do you have problems with loss of bowel control? N  Managing your Medications? N  Managing your Finances? N  Housekeeping or managing your Housekeeping? N    Patient Care Team: Mort Sawyers, FNP as PCP - General (Family Medicine)  Indicate any recent Medical Services you may have received from other than [facility] providers in the past year (date may be approximate).     Assessment:   This is a routine wellness examination for Jaisha.  Hearing/Vision screen Hearing Screening - Comments:: Pt denies any hearing issues  Vision Screening - Comments:: Pt follows up with Dr Sinda Du for annual eye exams   Dietary issues and exercise activities discussed:     Goals Addressed             This Visit's Progress    Patient Stated       Maintain health and activity        Depression Screen    05/18/2023    4:10 PM 05/09/2022   11:05 AM 08/30/2020   11:03 AM 09/13/2018   10:38 AM 09/01/2017   11:26  AM  PHQ 2/9 Scores  PHQ - 2 Score 0 0 0 0 0    Fall Risk    05/17/2023    8:53 PM 05/09/2022    9:56 AM 05/01/2021   11:04 AM 11/08/2019    4:42 PM 09/13/2018   10:38 AM  Fall Risk   Falls in the past year? 0 0 0 0 0  Number falls in past yr: 0 0 0    Injury with Fall?   0    Risk for fall due to : Impaired vision No Fall Risks     Follow up Falls prevention discussed   Falls evaluation completed Falls evaluation completed    MEDICARE RISK AT HOME: Medicare Risk at Home Any stairs in or around the home?: Yes If so, are there any without handrails?: No Home free of loose throw rugs in walkways, pet beds, electrical cords, etc?: Yes Adequate lighting in your home to reduce risk of falls?: Yes Life alert?: No Use of a cane, walker or w/c?: No Grab bars in the bathroom?: No Shower chair or bench in shower?: No Elevated toilet seat or a handicapped toilet?: No  TIMED UP AND GO:  Was the test performed?  No    Cognitive Function:        05/18/2023    4:12 PM  6CIT Screen  What Year? 0 points  What month? 0 points  What time? 0 points  Count back from 20 0 points  Months in reverse 0 points  Repeat phrase 0 points  Total Score 0 points    Immunizations Immunization History  Administered Date(s) Administered   Tdap 03/01/2014    TDAP status: Up to date  Flu Vaccine status: Declined, Education has been provided regarding the importance of this vaccine but patient still declined. Advised may receive this vaccine at local pharmacy or Health Dept. Aware to provide a copy of the vaccination record if obtained from local pharmacy or Health Dept. Verbalized acceptance and understanding.  Pneumococcal vaccine status: Declined,  Education has been provided regarding the importance of this vaccine but patient still declined. Advised may receive this vaccine at local pharmacy or Health Dept. Aware to provide a copy of the vaccination record if obtained from local pharmacy or  Health Dept. Verbalized acceptance and understanding.   Covid-19 vaccine status: Declined, Education has been provided regarding the importance of this vaccine but patient still declined. Advised may receive this vaccine at local pharmacy or Health Dept.or vaccine clinic. Aware to provide a copy of the vaccination record if obtained from local pharmacy or Health Dept. Verbalized acceptance and understanding.  Qualifies for Shingles Vaccine? No   Zostavax completed declined   Shingrix Completed?: Yes  Screening Tests Health Maintenance  Topic Date Due   Zoster Vaccines- Shingrix (1 of 2) Never done   FOOT EXAM  08/30/2021   Diabetic kidney evaluation - Urine ACR  05/10/2023   HEMOGLOBIN A1C  05/13/2023   Pneumonia Vaccine 5+ Years old (1 of 1 - PCV) 11/13/2023 (Originally 11/16/2014)   OPHTHALMOLOGY EXAM  06/17/2023   Diabetic kidney evaluation - eGFR measurement  08/12/2023   DTaP/Tdap/Td (2 - Td or Tdap) 03/01/2024   Medicare Annual Wellness (AWV)  05/17/2024   MAMMOGRAM  11/12/2024   Colonoscopy  11/05/2031   DEXA SCAN  Completed   HPV VACCINES  Aged Out   COVID-19 Vaccine  Discontinued   Hepatitis C Screening  Discontinued    Health Maintenance  Health Maintenance Due  Topic  Date Due   Zoster Vaccines- Shingrix (1 of 2) Never done   FOOT EXAM  08/30/2021   Diabetic kidney evaluation - Urine ACR  05/10/2023   HEMOGLOBIN A1C  05/13/2023    Colorectal cancer screening: Type of screening: Colonoscopy. Completed 11/04/21. Repeat every 10 years  Mammogram status: Completed 11/12/22. Repeat every year  Bone Density status: Completed 03/16/23. Results reflect: Bone density results: OSTEOPOROSIS. Repeat every 2 years.  Additional Screening:  Hepatitis C Screening: does not qualify  Vision Screening: Recommended annual ophthalmology exams for early detection of glaucoma and other disorders of the eye. Is the patient up to date with their annual eye exam?  Yes  Who is the  provider or what is the name of the office in which the patient attends annual eye exams? Dr Cathey Endow  If pt is not established with a provider, would they like to be referred to a provider to establish care? No .   Dental Screening: Recommended annual dental exams for proper oral hygiene  Diabetic Foot Exam: Diabetic Foot Exam: Completed 06/16/22  Community Resource Referral / Chronic Care Management: CRR required this visit?  No   CCM required this visit?  No     Plan:     I have personally reviewed and noted the following in the patient's chart:   Medical and social history Use of alcohol, tobacco or illicit drugs  Current medications and supplements including opioid prescriptions. Patient is not currently taking opioid prescriptions. Functional ability and status Nutritional status Physical activity Advanced directives List of other physicians Hospitalizations, surgeries, and ER visits in previous 12 months Vitals Screenings to include cognitive, depression, and falls Referrals and appointments  In addition, I have reviewed and discussed with patient certain preventive protocols, quality metrics, and best practice recommendations. A written personalized care plan for preventive services as well as general preventive health recommendations were provided to patient.     Marzella Schlein, LPN   05/27/5620   After Visit Summary: (MyChart) Due to this being a telephonic visit, the after visit summary with patients personalized plan was offered to patient via MyChart   Nurse Notes: none

## 2023-06-22 DIAGNOSIS — H524 Presbyopia: Secondary | ICD-10-CM | POA: Diagnosis not present

## 2023-06-22 LAB — HM DIABETES EYE EXAM

## 2023-07-06 ENCOUNTER — Other Ambulatory Visit: Payer: Self-pay

## 2023-07-06 NOTE — Telephone Encounter (Signed)
Filled by Dr. Selena Batten in the past ok to send refill as pended.

## 2023-07-07 MED ORDER — OMEPRAZOLE 40 MG PO CPDR: DELAYED_RELEASE_CAPSULE | ORAL | 3 refills | Status: AC

## 2023-07-08 ENCOUNTER — Encounter: Payer: Self-pay | Admitting: Family

## 2023-07-13 ENCOUNTER — Other Ambulatory Visit: Payer: Self-pay | Admitting: *Deleted

## 2023-07-20 ENCOUNTER — Other Ambulatory Visit: Payer: Self-pay | Admitting: Family

## 2023-07-20 DIAGNOSIS — E119 Type 2 diabetes mellitus without complications: Secondary | ICD-10-CM

## 2023-08-17 DIAGNOSIS — M545 Low back pain, unspecified: Secondary | ICD-10-CM | POA: Diagnosis not present

## 2023-09-11 ENCOUNTER — Other Ambulatory Visit: Payer: Self-pay | Admitting: Family

## 2023-09-11 ENCOUNTER — Encounter: Payer: Self-pay | Admitting: Emergency Medicine

## 2023-09-11 ENCOUNTER — Ambulatory Visit: Payer: Medicare Other | Admitting: Emergency Medicine

## 2023-09-11 VITALS — BP 146/74 | HR 85 | Temp 97.5°F | Ht 62.0 in | Wt 143.2 lb

## 2023-09-11 DIAGNOSIS — Z23 Encounter for immunization: Secondary | ICD-10-CM

## 2023-09-11 DIAGNOSIS — I10 Essential (primary) hypertension: Secondary | ICD-10-CM

## 2023-09-11 DIAGNOSIS — J449 Chronic obstructive pulmonary disease, unspecified: Secondary | ICD-10-CM

## 2023-09-11 NOTE — Assessment & Plan Note (Signed)
She is doing quite well and has not had any exacerbations in the last year.  She is tolerating Breztri.  Good functional capacity.  She still works as an Sales executive 2 days a week.  She is allergic to the flu shot.  She has never had a pneumococcal vaccine.  There is very little cross-reactivity between the flu shot and Prevnar 20 so we will give this today.  Please continue Breztri 2 puffs twice a day.  Rinse and gargle after using. Keep albuterol available to use 2 puffs up to every 4 hours if needed for shortness of breath, chest tightness, wheezing.  Deferred the flu shot since you are allergic to this.  There is very little cross-reactivity between the flu shot and Prevnar 20 pneumococcal vaccine.  For this reason it would be very rare for you to have a reaction.  We will give you the Prevnar 20 today. Follow with Dr. Delton Coombes in 1 year, sooner if you have any problems.

## 2023-09-11 NOTE — Progress Notes (Signed)
Subjective:    Patient ID: Monique Sanchez, female    DOB: Nov 13, 1950, 72 y.o.   MRN: [id number]  HPI 72 year old former smoker (15-20 pack years) with a history of CAD, hypertension, hyperlipidemia, prediabetes, seasonal allergies.  She is been followed in our office by Dr. Kendrick Fries for COPD/asthma.  She is here to reestablish care. She reports that she has baseline exertional SOB with vacuuming, housework. She can walk at slower pace, is able to shop. No real cough. She does have a lot nasal congestion and clear drainage esp in the am. She has astelin but uses rarely. On zyrtec. She is on symbicort and spiriva, has tried trelegy before but she had a rash from it. Infrequent flares. She did have a flare in June after allergies flared and dog exposure. She uses albuterol very rarely.  She has not had the COVID vaccines, had GBS with the flu shot and has been cautioned against it.   Spirometry 01/31/2014 reviewed by me, shows very severe obstruction with an FEV1 of 0.5 L (24% predicted).   ROV 09/10/22 --72 year old woman with a history severe obstructive lung disease from COPD/asthma.  Also with history of CAD, hypertension, hyperlipidemia, seasonal allergies with some associated cough.  She has been managed on Breztri for the last year.  She unfortunately had COVID-19 about 1 year ago. She has been doing fairly well - has been active, still works part time. She does get fatigued late in the day. She can be limited by breathing w heavier exertion. Can happen w house work and cleaning. No real cough, no mucous. No flares since last year. She needs a new albuterol - uses about 1x a day.   ROV 09/11/2023 --follow-up visit 72 year old woman with severe COPD/asthma, history of COVID-19 in 2022.  PMH also significant for CAD, hypertension, hyperlipidemia, seasonal allergies with some associated cough.  She has been managed on Breztri. Today she reports that she feels about the same. Minimal cough. She has  exertional SOB, does have to stop to rest sometimes. She does some housework - no longer vacuums. No flares since last year. Uses albuterol about 0-1x a day. She still works as an Sales executive. Allergic to flu shot, so has not had it.     Review of Systems As per HPI  Past Medical History:  Diagnosis Date   Arthritis    Asthma    Blockage of coronary artery of heart (HCC)    50% blockage single vessels   Claustrophobia    COPD (chronic obstructive pulmonary disease) (HCC)    Diabetes mellitus without complication (HCC)    Diverticular disease of colon 2010   GERD (gastroesophageal reflux disease)    Hypercholesteremia    Hypertension    Osteoarthritis    Pre-diabetes    Seasonal allergies      Family History  Problem Relation Age of Onset   Heart disease Mother    Cancer Mother        mets to lung, primary unclear   Heart disease Father    Lung cancer Brother        lung   Hyperlipidemia Brother    Hypertension Brother    Breast cancer Paternal Grandmother      Social History   Socioeconomic History   Marital status: Single    Spouse name: Not on file   Number of children: 1   Years of education: community college   Highest education level: Not on file  Occupational History  Not on file  Tobacco Use   Smoking status: Former    Current packs/day: 0.00    Average packs/day: 0.5 packs/day for 30.0 years (15.0 ttl pk-yrs)    Types: Cigarettes    Start date: 09/29/1977    Quit date: 09/30/2007    Years since quitting: 15.9   Smokeless tobacco: Never  Vaping Use   Vaping status: Never Used  Substance and Sexual Activity   Alcohol use: Yes    Alcohol/week: 0.0 standard drinks of alcohol    Comment: 1 glass of wine daily   Drug use: No   Sexual activity: Not Currently  Other Topics Concern   Not on file  Social History Narrative   08/30/20   From: the area   Living: alone   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      Family:  daughter - Herbert Seta - lives in [place]      Enjoys: difficult with her work hours, dinner with friends, play cards, shop      Exercise: not currently   Diet: tries to follow diabetic diet, limits sweets and fried foods      Safety   Seat belts: Yes    Guns: No   Safe in relationships: Yes    Social Drivers of Corporate investment banker Strain: Low Risk  (05/17/2023)   Overall Financial Resource Strain (CARDIA)    Difficulty of Paying Living Expenses: Not hard at all  Food Insecurity: No Food Insecurity (05/17/2023)   Hunger Vital Sign    Worried About Running Out of Food in the Last Year: Never true    Ran Out of Food in the Last Year: Never true  Transportation Needs: No Transportation Needs (05/17/2023)   PRAPARE - Administrator, Civil Service (Medical): No    Lack of Transportation (Non-Medical): No  Physical Activity: Inactive (05/17/2023)   Exercise Vital Sign    Days of Exercise per Week: 0 days    Minutes of Exercise per Session: 0 min  Stress: No Stress Concern Present (05/17/2023)   [employer] of Occupational Health - Occupational Stress Questionnaire    Feeling of Stress : Not at all  Social Connections: Socially Isolated (05/17/2023)   Social Connection and Isolation Panel [NHANES]    Frequency of Communication with Friends and Family: More than three times a week    Frequency of Social Gatherings with Friends and Family: Twice a week    Attends Religious Services: Never    Database administrator or Organizations: No    Attends Banker Meetings: Never    Marital Status: Widowed  Intimate Partner Violence: Not At Risk (05/18/2023)   Humiliation, Afraid, Rape, and Kick questionnaire    Fear of Current or Ex-Partner: No    Emotionally Abused: No    Physically Abused: No    Sexually Abused: No    She is an Sales executive, 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 Rarely some chemicals.  Has lived in [place]  No pets.   Allergies  Allergen Reactions   Influenza  Vaccines     Muscle cramps, shaking   Trelegy Ellipta [Fluticasone-Umeclidin-Vilant] Hives    Itching in face/neck   Fosamax [Alendronate] Other (See Comments)    Joint pains     Outpatient Medications Prior to Visit  Medication Sig Dispense Refill   acetaminophen (TYLENOL) 500 MG tablet Take 500 mg by mouth daily as needed.     albuterol (VENTOLIN HFA) 108 (90 Base)  MCG/ACT inhaler Inhale 1-2 puffs into the lungs every 4 (four) hours as needed for wheezing or shortness of breath. 18 g 4   azelastine (ASTELIN) 0.1 % nasal spray Place 2 sprays into both nostrils 2 (two) times daily. 30 mL 1   BREZTRI AEROSPHERE 160-9-4.8 MCG/ACT AERO USE 2 INHALATIONS IN THE MORNING AND AT BEDTIME 32.1 g 3   cetirizine (ZYRTEC) 10 MG tablet Take 10 mg by mouth at bedtime.     glucose blood test strip Check blood sugar once daily and as directed with onetouch ultra blue test strips. Dx E11.9 100 each 2   hydrochlorothiazide (HYDRODIURIL) 25 MG tablet Take 1 tablet (25 mg total) by mouth daily. 90 tablet 3   metFORMIN (GLUCOPHAGE) 500 MG tablet TAKE 1 TABLET TWICE A DAY WITH MEALS 60 tablet 0   metoprolol succinate (TOPROL-XL) 25 MG 24 hr tablet Take 1 tablet (25 mg total) by mouth daily. 90 tablet 3   omeprazole (PRILOSEC) 40 MG capsule TAKE 1 CAPSULE DAILY 90 capsule 3   ONETOUCH DELICA LANCETS 33G MISC Check blood sugar once daily and as directed. Dx E11.9 100 each 0   rosuvastatin (CRESTOR) 20 MG tablet Take 1 tablet (20 mg total) by mouth daily. for cholesterol 90 tablet 3   Spacer/Aero-Holding Chambers (AEROCHAMBER MV) inhaler Use as instructed 1 each 0   triamcinolone cream (KENALOG) 0.1 % Apply 1 Application topically as needed (dry skin). 30 g 0   No facility-administered medications prior to visit.         Objective:   Physical Exam Vitals:   09/11/23 1128 09/11/23 1129  BP: (!) 146/74 (!) 146/74  Pulse:  85  Temp:  (!) 97.5 F (36.4 C)  TempSrc:  Oral  SpO2: 93% 93%  Weight:  143 lb  3.2 oz (65 kg)  Height:  5\' 2"  (1.575 m)   Gen: Pleasant, well-nourished, in no distress,  normal affect  ENT: No lesions,  mouth clear,  oropharynx clear, no postnasal drip  Neck: No JVD, no stridor  Lungs: No use of accessory muscles, distant, no crackles or wheezing on normal respiration, no wheeze on forced expiration  Cardiovascular: RRR, heart sounds normal, no murmur or gallops, no peripheral edema  Musculoskeletal: No deformities, no cyanosis or clubbing  Neuro: alert, awake, non focal  Skin: Cool, no lesions or rash     Assessment & Plan:  COPD Grade C She is doing quite well and has not had any exacerbations in the last year.  She is tolerating Breztri.  Good functional capacity.  She still works as an Sales executive 2 days a week.  She is allergic to the flu shot.  She has never had a pneumococcal vaccine.  There is very little cross-reactivity between the flu shot and Prevnar 20 so we will give this today.  Please continue Breztri 2 puffs twice a day.  Rinse and gargle after using. Keep albuterol available to use 2 puffs up to every 4 hours if needed for shortness of breath, chest tightness, wheezing.  Deferred the flu shot since you are allergic to this.  There is very little cross-reactivity between the flu shot and Prevnar 20 pneumococcal vaccine.  For this reason it would be very rare for you to have a reaction.  We will give you the Prevnar 20 today. Follow with Dr. Delton Coombes in 1 year, sooner if you have any problems.    Levy Pupa, MD, PhD 09/11/2023, 11:50 AM [facility] [phone]  or if no answer before 7:00PM call [phone] For any issues after 7:00PM please call eLink [phone]

## 2023-09-11 NOTE — Patient Instructions (Signed)
Please continue Breztri 2 puffs twice a day.  Rinse and gargle after using. Keep albuterol available to use 2 puffs up to every 4 hours if needed for shortness of breath, chest tightness, wheezing.  Deferred the flu shot since you are allergic to this.  There is very little cross-reactivity between the flu shot and Prevnar 20 pneumococcal vaccine.  For this reason it would be very rare for you to have a reaction.  We will give you the Prevnar 20 today. Follow with Dr. Delton Coombes in 1 year, sooner if you have any problems.

## 2023-09-12 ENCOUNTER — Encounter: Payer: Self-pay | Admitting: Family

## 2023-09-12 DIAGNOSIS — I10 Essential (primary) hypertension: Secondary | ICD-10-CM

## 2023-09-14 MED ORDER — METOPROLOL SUCCINATE ER 25 MG PO TB24
25.0000 mg | ORAL_TABLET | Freq: Every day | ORAL | 0 refills | Status: DC
Start: 2023-09-14 — End: 2023-09-14

## 2023-09-14 MED ORDER — METOPROLOL SUCCINATE ER 25 MG PO TB24
25.0000 mg | ORAL_TABLET | Freq: Every day | ORAL | 0 refills | Status: DC
Start: 2023-09-14 — End: 2024-01-05

## 2023-09-16 ENCOUNTER — Encounter: Payer: Self-pay | Admitting: Family

## 2023-09-16 DIAGNOSIS — H0279 Other degenerative disorders of eyelid and periocular area: Secondary | ICD-10-CM | POA: Diagnosis not present

## 2023-09-16 NOTE — Telephone Encounter (Signed)
 Care team updated and letter sent for eye exam notes.

## 2023-09-17 NOTE — Progress Notes (Signed)
noted 

## 2023-09-18 ENCOUNTER — Ambulatory Visit (INDEPENDENT_AMBULATORY_CARE_PROVIDER_SITE_OTHER): Payer: Medicare Other | Admitting: Family

## 2023-09-18 ENCOUNTER — Telehealth: Payer: Self-pay | Admitting: Family

## 2023-09-18 ENCOUNTER — Telehealth: Payer: Self-pay

## 2023-09-18 ENCOUNTER — Encounter: Payer: Self-pay | Admitting: Family

## 2023-09-18 VITALS — BP 140/70 | HR 72 | Temp 97.8°F | Ht 62.5 in | Wt 143.2 lb

## 2023-09-18 DIAGNOSIS — E782 Mixed hyperlipidemia: Secondary | ICD-10-CM | POA: Diagnosis not present

## 2023-09-18 DIAGNOSIS — E119 Type 2 diabetes mellitus without complications: Secondary | ICD-10-CM

## 2023-09-18 DIAGNOSIS — I10 Essential (primary) hypertension: Secondary | ICD-10-CM

## 2023-09-18 DIAGNOSIS — I240 Acute coronary thrombosis not resulting in myocardial infarction: Secondary | ICD-10-CM | POA: Diagnosis not present

## 2023-09-18 DIAGNOSIS — M816 Localized osteoporosis [Lequesne]: Secondary | ICD-10-CM

## 2023-09-18 LAB — LIPID PANEL
Cholesterol: 131 mg/dL (ref 0–200)
HDL: 46.9 mg/dL (ref >39.00)
LDL Cholesterol: 46 mg/dL (ref 0–99)
NonHDL: 84.2
Total CHOL/HDL Ratio: 3
Triglycerides: 193 mg/dL — ABNORMAL HIGH (ref 0.0–149.0)
VLDL: 38.6 mg/dL (ref 0.0–40.0)

## 2023-09-18 LAB — BASIC METABOLIC PANEL
BUN: 22 mg/dL (ref 6–23)
CO2: 34 meq/L — ABNORMAL HIGH (ref 19–32)
Calcium: 9 mg/dL (ref 8.4–10.5)
Chloride: 98 meq/L (ref 96–112)
Creatinine, Ser: 0.86 mg/dL (ref 0.40–1.20)
GFR: 66.82 mL/min (ref >60.00)
Glucose, Bld: 119 mg/dL — ABNORMAL HIGH (ref 70–99)
Potassium: 4.1 meq/L (ref 3.5–5.1)
Sodium: 140 meq/L (ref 135–145)

## 2023-09-18 LAB — MICROALBUMIN / CREATININE URINE RATIO
Creatinine,U: 56.1 mg/dL
Microalb Creat Ratio: 10.8 mg/g (ref 0.0–30.0)
Microalb, Ur: 6 mg/dL — ABNORMAL HIGH (ref 0.0–1.9)

## 2023-09-18 LAB — HEMOGLOBIN A1C: Hgb A1c MFr Bld: 7.4 % — ABNORMAL HIGH (ref 4.6–6.5)

## 2023-09-18 MED ORDER — DENOSUMAB 60 MG/ML ~~LOC~~ SOSY: 60.0000 mg | PREFILLED_SYRINGE | Freq: Once | SUBCUTANEOUS | Status: AC

## 2023-09-18 MED ORDER — ROSUVASTATIN CALCIUM 20 MG PO TABS: 20.0000 mg | ORAL_TABLET | Freq: Every day | ORAL | 3 refills | Status: AC

## 2023-09-18 MED ORDER — HYDROCHLOROTHIAZIDE 25 MG PO TABS: 25.0000 mg | ORAL_TABLET | Freq: Every day | ORAL | 3 refills | Status: AC

## 2023-09-18 NOTE — Progress Notes (Unsigned)
Established Patient Office Visit  Subjective:      CC:  Chief Complaint  Patient presents with   Diabetes    HPI: Monique Sanchez is a 72 y.o. female presenting on 09/18/2023 for Diabetes . Dm2: on metformin twice daily did not start tradjenta and or jardiance because too expensive.   Stenosis of carotid artery, vas u/s carotid was ordered pt states she had this done however I do not see this in records. However 11/2022 there is a message from stating normal findings on carotid u/s but somehow these images/impression are not in system. Will have to look into this. Was done at [facility].   Lab Results  Component Value Date   HGBA1C 7.1 (A) 11/12/2022   Osteoporosis: was unable to tolerate fosamax made her feel sick as a dog.         Social history:  Relevant past medical, surgical, family and social history reviewed and updated as indicated. Interim medical history since our last visit reviewed.  Allergies and medications reviewed and updated.  DATA REVIEWED: CHART IN EPIC     ROS: Negative unless specifically indicated above in HPI.    Current Outpatient Medications:    acetaminophen (TYLENOL) 500 MG tablet, Take 500 mg by mouth daily as needed., Disp: , Rfl:    albuterol (VENTOLIN HFA) 108 (90 Base) MCG/ACT inhaler, Inhale 1-2 puffs into the lungs every 4 (four) hours as needed for wheezing or shortness of breath., Disp: 18 g, Rfl: 4   azelastine (ASTELIN) 0.1 % nasal spray, Place 2 sprays into both nostrils 2 (two) times daily., Disp: 30 mL, Rfl: 1   BREZTRI AEROSPHERE 160-9-4.8 MCG/ACT AERO, USE 2 INHALATIONS IN THE MORNING AND AT BEDTIME, Disp: 32.1 g, Rfl: 3   cetirizine (ZYRTEC) 10 MG tablet, Take 10 mg by mouth at bedtime., Disp: , Rfl:    glucose blood test strip, Check blood sugar once daily and as directed with onetouch ultra blue test strips. Dx E11.9, Disp: 100 each, Rfl: 2   metFORMIN (GLUCOPHAGE) 500 MG tablet, TAKE 1 TABLET  TWICE A DAY WITH MEALS, Disp: 60 tablet, Rfl: 0   metoprolol succinate (TOPROL-XL) 25 MG 24 hr tablet, Take 1 tablet (25 mg total) by mouth daily., Disp: 90 tablet, Rfl: 0   omeprazole (PRILOSEC) 40 MG capsule, TAKE 1 CAPSULE DAILY, Disp: 90 capsule, Rfl: 3   ONETOUCH DELICA LANCETS 33G MISC, Check blood sugar once daily and as directed. Dx E11.9, Disp: 100 each, Rfl: 0   Spacer/Aero-Holding Chambers (AEROCHAMBER MV) inhaler, Use as instructed, Disp: 1 each, Rfl: 0   triamcinolone cream (KENALOG) 0.1 %, Apply 1 Application topically as needed (dry skin)., Disp: 30 g, Rfl: 0   hydrochlorothiazide (HYDRODIURIL) 25 MG tablet, Take 1 tablet (25 mg total) by mouth daily., Disp: 90 tablet, Rfl: 3   rosuvastatin (CRESTOR) 20 MG tablet, Take 1 tablet (20 mg total) by mouth daily. for cholesterol, Disp: 90 tablet, Rfl: 3      Objective:    BP (!) 140/70   Pulse 72   Temp 97.8 F (36.6 C) (Temporal)   Ht 5' 2.5" (1.588 m)   Wt 143 lb 3.2 oz (65 kg)   SpO2 98%   BMI 25.77 kg/m   Wt Readings from Last 3 Encounters:  09/18/23 143 lb 3.2 oz (65 kg)  09/11/23 143 lb 3.2 oz (65 kg)  05/18/23 144 lb (65.3 kg)    Physical Exam Constitutional:  General: She is not in acute distress.    Appearance: Normal appearance. She is normal weight. She is not ill-appearing, toxic-appearing or diaphoretic.  HENT:     Head: Normocephalic.  Cardiovascular:     Rate and Rhythm: Normal rate.  Pulmonary:     Effort: Pulmonary effort is normal.  Musculoskeletal:        General: Normal range of motion.  Neurological:     General: No focal deficit present.     Mental Status: She is alert and oriented to person, place, and time. Mental status is at baseline.  Psychiatric:        Mood and Affect: Mood normal.        Behavior: Behavior normal.        Thought Content: Thought content normal.        Judgment: Judgment normal.           Assessment & Plan:  Blockage of coronary artery of heart  (HCC)  Primary hypertension -     Microalbumin / creatinine urine ratio  Controlled type 2 diabetes mellitus without complication, without long-term current use of insulin (HCC) -     Hemoglobin A1c -     Basic metabolic panel -     Microalbumin / creatinine urine ratio  Mixed hyperlipidemia -     Lipid panel -     Rosuvastatin Calcium; Take 1 tablet (20 mg total) by mouth daily. for cholesterol  Dispense: 90 tablet; Refill: 3  Essential hypertension -     hydroCHLOROthiazide; Take 1 tablet (25 mg total) by mouth daily.  Dispense: 90 tablet; Refill: 3     Return in about 3 months (around 12/17/2023) for f/u diabetes.  Mort Sawyers, MSN, APRN, FNP-C [facility]

## 2023-09-18 NOTE — Telephone Encounter (Deleted)
-----   Message from Crosby sent at 09/18/2023 10:40 AM EST ----- Can we look into prices for prolia if pt were to start this?  She failed fosamax.

## 2023-09-18 NOTE — Telephone Encounter (Signed)
Have started order for Prolia to get benefits ran. Will reach out to patient once we have received.

## 2023-09-18 NOTE — Telephone Encounter (Signed)
Mort Sawyers, FNP  P Dugal Pool Can we reach out to [facility] to look for VAS u/s Carotid results she had this done 12/03/22 but the report is missing. The vascular doctor that performed the imaging was Dr. Wyn Quaker. ------------------------------------- This document is under the "Media" tab in the pt's chart. DOS is listed as 12/08/2022. I have printed a copy and placed it in your in box as well.

## 2023-09-18 NOTE — Telephone Encounter (Signed)
-----   Message from Crosby sent at 09/18/2023 10:40 AM EST ----- Can we look into prices for prolia if pt were to start this?  She failed fosamax.

## 2023-09-21 ENCOUNTER — Encounter: Payer: Self-pay | Admitting: Family

## 2023-09-21 ENCOUNTER — Other Ambulatory Visit: Payer: Self-pay | Admitting: Family

## 2023-09-21 DIAGNOSIS — R809 Proteinuria, unspecified: Secondary | ICD-10-CM

## 2023-09-21 MED ORDER — LOSARTAN POTASSIUM 25 MG PO TABS: 25.0000 mg | ORAL_TABLET | Freq: Every day | ORAL | 3 refills | Status: DC

## 2023-09-23 NOTE — Assessment & Plan Note (Signed)
Continue crestor  Continue low chol diet and exercise as tolerated.

## 2023-09-23 NOTE — Assessment & Plan Note (Addendum)
Ordered hga1c today pending results. Work on diabetic diet and exercise as tolerated. Yearly foot exam, and annual eye exam. Pending results will determine next steps for medications. Tradjenta and jardiance too expensive.

## 2023-09-23 NOTE — Assessment & Plan Note (Signed)
Cont hydrochlorothiazide 25 mg once daily

## 2023-09-24 ENCOUNTER — Encounter: Payer: Self-pay | Admitting: Family

## 2023-09-24 DIAGNOSIS — E119 Type 2 diabetes mellitus without complications: Secondary | ICD-10-CM

## 2023-09-24 MED ORDER — SEMAGLUTIDE (1 MG/DOSE) 4 MG/3ML ~~LOC~~ SOPN: 1.0000 mg | PEN_INJECTOR | SUBCUTANEOUS | 2 refills | Status: AC

## 2023-09-24 MED ORDER — LOSARTAN POTASSIUM 25 MG PO TABS: 25.0000 mg | ORAL_TABLET | Freq: Every day | ORAL | 3 refills | Status: AC

## 2023-09-24 MED ORDER — OZEMPIC (0.25 OR 0.5 MG/DOSE) 2 MG/3ML ~~LOC~~ SOPN: 0.5000 mg | PEN_INJECTOR | SUBCUTANEOUS | 0 refills | Status: AC

## 2023-09-24 MED ORDER — OZEMPIC (0.25 OR 0.5 MG/DOSE) 2 MG/3ML ~~LOC~~ SOPN: PEN_INJECTOR | SUBCUTANEOUS | 0 refills | Status: AC

## 2023-09-24 NOTE — Telephone Encounter (Signed)
Noted  

## 2023-09-25 ENCOUNTER — Telehealth: Payer: Self-pay

## 2023-09-25 NOTE — Telephone Encounter (Signed)
Prolia VOB initiated via [email or web address] 

## 2023-10-01 NOTE — Telephone Encounter (Signed)
 Marland Kitchen

## 2023-10-02 ENCOUNTER — Other Ambulatory Visit (HOSPITAL_COMMUNITY): Payer: Self-pay

## 2023-10-02 NOTE — Telephone Encounter (Signed)
 Pt ready for scheduling for Evenity on or after : 10/02/23  Out-of-pocket cost due at time of visit: $~450  Number of injection/visits approved: 12  Primary: [employer] of [place] - Medicare Evenity co-insurance: 20% Admin fee co-insurance: 100%  Secondary: N/A Prolia  co-insurance:  Admin fee co-insurance:   Medical Benefit Details: Date Benefits were checked: 09/30/23 Deductible: no/ Coinsurance: 20%/ Admin Fee: no  Prior Auth: not required PA# Expiration Date:  # of doses approved:  Pharmacy benefit: Copay $-- If patient wants fill through the pharmacy benefit please send prescription to:  - -, and include estimated need by date in rx notes. Pharmacy will ship medication directly to the office.  Patient not eligible for Evenity Copay Card. Copay Card can make patient's cost as little as $25. Link to apply: [email or web address]://www.amgensupportplus.com/copay  ** This summary of benefits is an estimation of the patient's out-of-pocket cost. Exact cost may very based on individual plan coverage.

## 2023-10-06 ENCOUNTER — Encounter: Payer: Self-pay | Admitting: Family

## 2023-10-09 DIAGNOSIS — Z0000 Encounter for general adult medical examination without abnormal findings: Secondary | ICD-10-CM | POA: Diagnosis not present

## 2023-10-09 DIAGNOSIS — E1129 Type 2 diabetes mellitus with other diabetic kidney complication: Secondary | ICD-10-CM | POA: Diagnosis not present

## 2023-10-09 DIAGNOSIS — R809 Proteinuria, unspecified: Secondary | ICD-10-CM | POA: Diagnosis not present

## 2023-10-09 DIAGNOSIS — E7800 Pure hypercholesterolemia, unspecified: Secondary | ICD-10-CM | POA: Diagnosis not present

## 2023-10-09 DIAGNOSIS — I10 Essential (primary) hypertension: Secondary | ICD-10-CM | POA: Diagnosis not present

## 2023-10-14 DIAGNOSIS — H53483 Generalized contraction of visual field, bilateral: Secondary | ICD-10-CM | POA: Diagnosis not present

## 2023-10-15 ENCOUNTER — Telehealth: Payer: Self-pay | Admitting: Family

## 2023-10-15 NOTE — Telephone Encounter (Signed)
Copied from CRM [id number]. Topic: General - Other >> Oct 15, 2023  1:45 PM Whitney O wrote: Reason for CRM: patient is returning call for a joellen . Patient doesn't know what it could have been about . Looked over chart didn't see any notes . Called CAL line . Call back number [phone]. Patient is at work and if she can't answer leave a detailed message please.

## 2023-10-15 NOTE — Telephone Encounter (Signed)
Have sent patient my cart trying to contact about Prolia.

## 2023-10-15 NOTE — Telephone Encounter (Signed)
See referral for further documentation. Sending my chart to patient as well to get her to call office.

## 2023-10-19 NOTE — Telephone Encounter (Signed)
I'm guessing this is even with prior auth including that she failed fosamax?

## 2023-10-30 DIAGNOSIS — I10 Essential (primary) hypertension: Secondary | ICD-10-CM | POA: Diagnosis not present

## 2023-11-03 ENCOUNTER — Other Ambulatory Visit: Payer: Self-pay | Admitting: Emergency Medicine

## 2024-01-05 ENCOUNTER — Other Ambulatory Visit: Payer: Self-pay | Admitting: Family

## 2024-01-05 DIAGNOSIS — I10 Essential (primary) hypertension: Secondary | ICD-10-CM

## 2024-02-01 DIAGNOSIS — E1129 Type 2 diabetes mellitus with other diabetic kidney complication: Secondary | ICD-10-CM | POA: Diagnosis not present

## 2024-02-01 DIAGNOSIS — Z79899 Other long term (current) drug therapy: Secondary | ICD-10-CM | POA: Diagnosis not present

## 2024-02-01 DIAGNOSIS — E7800 Pure hypercholesterolemia, unspecified: Secondary | ICD-10-CM | POA: Diagnosis not present

## 2024-02-03 DIAGNOSIS — M545 Low back pain, unspecified: Secondary | ICD-10-CM | POA: Diagnosis not present

## 2024-02-03 DIAGNOSIS — Z79899 Other long term (current) drug therapy: Secondary | ICD-10-CM | POA: Diagnosis not present

## 2024-02-03 DIAGNOSIS — E7800 Pure hypercholesterolemia, unspecified: Secondary | ICD-10-CM | POA: Diagnosis not present

## 2024-02-03 DIAGNOSIS — E118 Type 2 diabetes mellitus with unspecified complications: Secondary | ICD-10-CM | POA: Diagnosis not present

## 2024-02-04 ENCOUNTER — Other Ambulatory Visit: Payer: Self-pay | Admitting: Family Medicine

## 2024-02-04 DIAGNOSIS — Z1231 Encounter for screening mammogram for malignant neoplasm of breast: Secondary | ICD-10-CM

## 2024-02-15 DIAGNOSIS — H02831 Dermatochalasis of right upper eyelid: Secondary | ICD-10-CM | POA: Diagnosis not present

## 2024-02-15 DIAGNOSIS — H02413 Mechanical ptosis of bilateral eyelids: Secondary | ICD-10-CM | POA: Diagnosis not present

## 2024-02-15 DIAGNOSIS — H02834 Dermatochalasis of left upper eyelid: Secondary | ICD-10-CM | POA: Diagnosis not present

## 2024-03-04 ENCOUNTER — Ambulatory Visit
Admission: RE | Admit: 2024-03-04 | Discharge: 2024-03-04 | Disposition: A | Discharge status: Home / Self Care | Source: Ambulatory Visit | Attending: Family Medicine | Admitting: Family Medicine

## 2024-03-04 DIAGNOSIS — Z1231 Encounter for screening mammogram for malignant neoplasm of breast: Secondary | ICD-10-CM | POA: Diagnosis not present

## 2024-03-17 ENCOUNTER — Other Ambulatory Visit: Payer: Self-pay | Admitting: Emergency Medicine

## 2024-04-03 DIAGNOSIS — T63451A Toxic effect of venom of hornets, accidental (unintentional), initial encounter: Secondary | ICD-10-CM | POA: Diagnosis not present

## 2024-05-05 DIAGNOSIS — E7800 Pure hypercholesterolemia, unspecified: Secondary | ICD-10-CM | POA: Diagnosis not present

## 2024-05-05 DIAGNOSIS — Z79899 Other long term (current) drug therapy: Secondary | ICD-10-CM | POA: Diagnosis not present

## 2024-05-05 DIAGNOSIS — E1129 Type 2 diabetes mellitus with other diabetic kidney complication: Secondary | ICD-10-CM | POA: Diagnosis not present

## 2024-05-05 DIAGNOSIS — J3089 Other allergic rhinitis: Secondary | ICD-10-CM | POA: Diagnosis not present

## 2024-07-04 ENCOUNTER — Other Ambulatory Visit: Payer: Self-pay | Admitting: Family

## 2024-07-04 DIAGNOSIS — I10 Essential (primary) hypertension: Secondary | ICD-10-CM

## 2024-07-06 ENCOUNTER — Ambulatory Visit: Admitting: Emergency Medicine

## 2024-07-28 DIAGNOSIS — L089 Local infection of the skin and subcutaneous tissue, unspecified: Secondary | ICD-10-CM | POA: Diagnosis not present

## 2024-08-10 ENCOUNTER — Telehealth: Payer: Self-pay | Admitting: Family

## 2024-08-10 NOTE — Telephone Encounter (Signed)
 Contacted Monique Sanchez to schedule their annual wellness visit. Patient declined to schedule AWV at this time. Patient has transferred care to [name].  [facility] Care Guide [facility] AWV TEAM Direct Dial: [phone]

## 2024-09-08 NOTE — Telephone Encounter (Signed)
 Old PCP removed, new on put in.

## 2024-09-12 ENCOUNTER — Other Ambulatory Visit: Payer: Self-pay | Admitting: Family

## 2024-09-12 DIAGNOSIS — E782 Mixed hyperlipidemia: Secondary | ICD-10-CM

## 2024-09-12 DIAGNOSIS — I10 Essential (primary) hypertension: Secondary | ICD-10-CM

## 2024-09-28 ENCOUNTER — Ambulatory Visit: Admitting: Emergency Medicine

## 2024-10-03 DIAGNOSIS — I10 Essential (primary) hypertension: Secondary | ICD-10-CM

## 2024-10-07 ENCOUNTER — Ambulatory Visit: Admitting: Emergency Medicine

## 2024-10-07 ENCOUNTER — Encounter: Payer: Self-pay | Admitting: Emergency Medicine

## 2024-10-07 VITALS — BP 120/60 | HR 73 | Ht 62.0 in | Wt 129.4 lb

## 2024-10-07 DIAGNOSIS — J449 Chronic obstructive pulmonary disease, unspecified: Secondary | ICD-10-CM | POA: Diagnosis not present

## 2024-10-07 DIAGNOSIS — J309 Allergic rhinitis, unspecified: Secondary | ICD-10-CM

## 2024-10-07 DIAGNOSIS — J301 Allergic rhinitis due to pollen: Secondary | ICD-10-CM

## 2024-10-07 DIAGNOSIS — Z87891 Personal history of nicotine dependence: Secondary | ICD-10-CM

## 2024-10-07 MED ORDER — BREZTRI AEROSPHERE 160-9-4.8 MCG/ACT IN AERO: 2.0000 | INHALATION_SPRAY | Freq: Two times a day (BID) | RESPIRATORY_TRACT | 8 refills | Status: AC

## 2024-10-07 MED ORDER — AEROCHAMBER MV MISC: 0 refills | Status: AC

## 2024-10-07 NOTE — Patient Instructions (Addendum)
 There is no known cross-reactivity between the flu shot and the RSV vaccine so you should be able to get the RSV vaccine without any issues. Pneumonia vaccine is up-to-date Continue Breztri  2 puffs twice a day.  Rinse and gargle after using. Keep your albuterol  available to use 2 puffs when needed for shortness of breath, chest tightness, wheezing. Continue your Zyrtec every day Keep your Astelin  nasal spray available to use if needed for congestion and drainage. Follow Dr. Shelah in 1 year.  Please call sooner if you have any problems so we can see you then.

## 2024-10-07 NOTE — Assessment & Plan Note (Signed)
 There is no known cross-reactivity between the flu shot and the RSV vaccine so you should be able to get the RSV vaccine without any issues. Pneumonia vaccine is up-to-date Continue Breztri  2 puffs twice a day.  Rinse and gargle after using. Keep your albuterol  available to use 2 puffs when needed for shortness of breath, chest tightness, wheezing. Follow Dr. Shelah in 1 year.  Please call sooner if you have any problems so we can see you then.

## 2024-10-07 NOTE — Assessment & Plan Note (Signed)
 Continue your Zyrtec every day Keep your Astelin  nasal spray available to use if needed for congestion and drainage.

## 2024-10-07 NOTE — Progress Notes (Signed)
 "  Subjective:    Patient ID: Monique Sanchez, female    DOB: November 06, 1950, 74 y.o.   MRN: [id number]  HPI  ROV 09/11/2023 --follow-up visit 74 year old woman with severe COPD/asthma, history of COVID-19 in 2022.  PMH also significant for CAD, hypertension, hyperlipidemia, seasonal allergies with some associated cough.  She has been managed on Breztri . Today she reports that she feels about the same. Minimal cough. She has exertional SOB, does have to stop to rest sometimes. She does some housework - no longer vacuums. No flares since last year. Uses albuterol  about 0-1x a day. She still works as an sales executive. Allergic to flu shot, so has not had it.   ROV 10/07/2024 --follow-up visit for 74 year old woman with history of severe COPD/asthma, CAD, hypertension, hyperlipidemia, seasonal allergies with some associated cough.  She had COVID-19 in 2022. She reports today that she feels about the same - she will get some SOB when she works for a full day, assoc w some SOB. No cough. Occasionally hears some wheeze, when fatigued or over exerting. Remains on zyrtec and astelin  prn. PNA shot up to date. Allergic to flu shot, wondering aboyt getting the flu shot.  She has been managed on Breztri , uses ventolin  a few times a week, not daily.    Review of Systems As per HPI  Past Medical History:  Diagnosis Date   Arthritis    Asthma    Blockage of coronary artery of heart (HCC)    50% blockage single vessels   Claustrophobia    COPD (chronic obstructive pulmonary disease) (HCC)    Diabetes mellitus without complication (HCC)    Diverticular disease of colon 2010   GERD (gastroesophageal reflux disease)    Hypercholesteremia    Hypertension    Osteoarthritis    Pre-diabetes    Seasonal allergies      Family History  Problem Relation Age of Onset   Heart disease Mother    Cancer Mother        mets to lung, primary unclear   Heart disease Father    Lung cancer Brother        lung    Hyperlipidemia Brother    Hypertension Brother    Breast cancer Paternal Grandmother      Social History   Socioeconomic History   Marital status: Single    Spouse name: Not on file   Number of children: 1   Years of education: community college   Highest education level: Associate degree: academic program  Occupational History   Not on file  Tobacco Use   Smoking status: Former    Current packs/day: 0.00    Average packs/day: 0.5 packs/day for 30.0 years (15.0 ttl pk-yrs)    Types: Cigarettes    Start date: 09/29/1977    Quit date: 09/30/2007    Years since quitting: 17.0   Smokeless tobacco: Never  Vaping Use   Vaping status: Never Used  Substance and Sexual Activity   Alcohol use: Yes    Alcohol/week: 0.0 standard drinks of alcohol    Comment: 1 glass of wine daily   Drug use: No   Sexual activity: Not Currently  Other Topics Concern   Not on file  Social History Narrative   08/30/20   From: the area   Living: alone   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      Family: daughter - Powell - lives in [place]      Enjoys:  difficult with her work hours, dinner with friends, play cards, shop      Exercise: not currently   Diet: tries to follow diabetic diet, limits sweets and fried foods      Safety   Seat belts: Yes    Guns: No   Safe in relationships: Yes    Social Drivers of Health   Tobacco Use: Medium Risk (10/07/2024)   Patient History    Smoking Tobacco Use: Former    Smokeless Tobacco Use: Never    Passive Exposure: Not on file  Financial Resource Strain: Low Risk  (04/03/2024)   Received from [facility]   Overall Financial Resource Strain (CARDIA)    Difficulty of Paying Living Expenses: Not hard at all  Food Insecurity: No Food Insecurity (04/03/2024)   Received from [facility]   Epic    Within the past 12 months, you worried that your food would run out before you got the money to buy  more.: Never true    Within the past 12 months, the food you bought just didn't last and you didn't have money to get more.: Never true  Transportation Needs: No Transportation Needs (04/03/2024)   Received from [facility] - Transportation    In the past 12 months, has lack of transportation kept you from medical appointments or from getting medications?: No    Lack of Transportation (Non-Medical): No  Physical Activity: Inactive (09/17/2023)   Exercise Vital Sign    Days of Exercise per Week: 0 days    Minutes of Exercise per Session: 0 min  Stress: No Stress Concern Present (09/17/2023)   [employer] [facility] [employer] - Occupational Stress Questionnaire    Feeling of Stress : Not at all  Social Connections: Socially Isolated (09/17/2023)   Social Connection and Isolation Panel    Frequency of Communication with Friends and Family: More than three times a week    Frequency of Social Gatherings with Friends and Family: Twice a week    Attends Religious Services: Never    Database Administrator or Organizations: No    Attends Banker Meetings: Never    Marital Status: Widowed  Intimate Partner Violence: Not At Risk (05/18/2023)   Humiliation, Afraid, Rape, and Kick questionnaire    Fear of Current or Ex-Partner: No    Emotionally Abused: No    Physically Abused: No    Sexually Abused: No  Depression (PHQ2-9): Low Risk (05/18/2023)   Depression (PHQ2-9)    PHQ-2 Score: 0  Alcohol Screen: Low Risk (09/17/2023)   Alcohol Screen    Last Alcohol Screening Score (AUDIT): 3  Housing: Low Risk  (04/03/2024)   Received from [facility]   Epic    At any time in the past 12 months, were you homeless or living in a shelter (including now)?: No    In the past 12 months, how many times have you moved where you were living?: 0    In the last 12 months, was there a time when you were not able to pay the mortgage or rent on time?:  No  Utilities: Not At Risk (04/03/2024)   Received from [facility]   Epic    In the past 12 months has the electric, gas, oil, or water company threatened to shut off services in your home?: No  Health Literacy: Adequate Health Literacy (05/18/2023)   B1300 Health Literacy  Frequency of need for help with medical instructions: Never    She is an optician, 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 Rarely some chemicals.  Has lived in [place]  No pets.   Allergies  Allergen Reactions   Influenza Vaccines     Muscle cramps, shaking   Trelegy Ellipta [Fluticasone -Umeclidin-Vilant] Hives    Itching in face/neck   Fosamax  [Alendronate ] Other (See Comments)    Joint pains     Outpatient Medications Prior to Visit  Medication Sig Dispense Refill   acetaminophen  (TYLENOL ) 500 MG tablet Take 500 mg by mouth daily as needed.     azelastine  (ASTELIN ) 0.1 % nasal spray Place 2 sprays into both nostrils 2 (two) times daily. 30 mL 1   cetirizine (ZYRTEC) 10 MG tablet Take 10 mg by mouth at bedtime.     glucose blood test strip Check blood sugar once daily and as directed with onetouch ultra blue test strips. Dx E11.9 100 each 2   hydrochlorothiazide  (HYDRODIURIL ) 25 MG tablet Take 1 tablet (25 mg total) by mouth daily. 90 tablet 3   losartan  (COZAAR ) 25 MG tablet Take 1 tablet (25 mg total) by mouth daily. 90 tablet 3   metFORMIN  (GLUCOPHAGE ) 500 MG tablet TAKE 1 TABLET TWICE A DAY WITH MEALS 60 tablet 0   metoprolol  succinate (TOPROL -XL) 25 MG 24 hr tablet TAKE 1 TABLET DAILY 90 tablet 0   omeprazole  (PRILOSEC) 40 MG capsule TAKE 1 CAPSULE DAILY 90 capsule 3   ONETOUCH DELICA LANCETS 33G MISC Check blood sugar once daily and as directed. Dx E11.9 100 each 0   rosuvastatin  (CRESTOR ) 20 MG tablet Take 1 tablet (20 mg total) by mouth daily. for cholesterol 90 tablet 3   Semaglutide , 1 MG/DOSE, 4 MG/3ML SOPN Inject 1 mg as directed once a week. 3 mL 2   Semaglutide ,0.25 or 0.5MG /DOS, (OZEMPIC , 0.25 OR 0.5  MG/DOSE,) 2 MG/3ML SOPN Inject 0.25 mg once weekly for four weeks then increase to 0.5 mg weekly 3 mL 0   Semaglutide ,0.25 or 0.5MG /DOS, (OZEMPIC , 0.25 OR 0.5 MG/DOSE,) 2 MG/3ML SOPN Inject 0.5 mg into the skin once a week. 3 mL 0   triamcinolone  cream (KENALOG ) 0.1 % Apply 1 Application topically as needed (dry skin). 30 g 0   VENTOLIN  HFA 108 (90 Base) MCG/ACT inhaler INHALE 1 TO 2 PUFFS INTO THE LUNGS EVERY 4 HOURS AS NEEDED FOR WHEEZING OR SHORTNESS OF BREATH 18 g 4   BREZTRI  AEROSPHERE 160-9-4.8 MCG/ACT AERO USE 2 INHALATIONS IN THE MORNING AND AT BEDTIME 32.1 g 3   Spacer/Aero-Holding Chambers (AEROCHAMBER MV) inhaler Use as instructed 1 each 0   Facility-Administered Medications Prior to Visit  Medication Dose Route Frequency Provider Last Rate Last Admin   denosumab  (PROLIA ) injection 60 mg  60 mg Subcutaneous Once Dugal, Tabitha, FNP             Objective:   Physical Exam Vitals:   10/07/24 1110  BP: 120/60  Pulse: 73  SpO2: 98%  Weight: 129 lb 6.4 oz (58.7 kg)  Height: 5' 2 (1.575 m)   Gen: Pleasant, well-nourished, in no distress,  normal affect  ENT: No lesions,  mouth clear,  oropharynx clear, no postnasal drip  Neck: No JVD, no stridor  Lungs: No use of accessory muscles, distant, no crackles or wheezing on normal respiration, no wheeze on forced expiration  Cardiovascular: RRR, heart sounds normal, no murmur or gallops, no peripheral edema  Musculoskeletal: No deformities, no cyanosis or clubbing  Neuro: alert, awake,  non focal  Skin: Cool, no lesions or rash     Assessment & Plan:  COPD Grade C There is no known cross-reactivity between the flu shot and the RSV vaccine so you should be able to get the RSV vaccine without any issues. Pneumonia vaccine is up-to-date Continue Breztri  2 puffs twice a day.  Rinse and gargle after using. Keep your albuterol  available to use 2 puffs when needed for shortness of breath, chest tightness, wheezing. Follow Dr.  Shelah in 1 year.  Please call sooner if you have any problems so we can see you then.  Allergic rhinitis Continue your Zyrtec every day Keep your Astelin  nasal spray available to use if needed for congestion and drainage.     Lamar Shelah, MD, PhD 10/07/2024, 11:32 AM [facility] [phone] or if no answer before 7:00PM call [phone] For any issues after 7:00PM please call eLink [phone]   "

## 2024-10-31 ENCOUNTER — Other Ambulatory Visit: Payer: Self-pay | Admitting: Family

## 2024-10-31 DIAGNOSIS — R809 Proteinuria, unspecified: Secondary | ICD-10-CM

## 2024-11-03 ENCOUNTER — Other Ambulatory Visit: Payer: Self-pay | Admitting: Family Medicine

## 2024-11-03 DIAGNOSIS — Z1231 Encounter for screening mammogram for malignant neoplasm of breast: Secondary | ICD-10-CM

## 2024-11-03 DIAGNOSIS — M810 Age-related osteoporosis without current pathological fracture: Secondary | ICD-10-CM

## 2025-03-20 ENCOUNTER — Other Ambulatory Visit

## 2025-03-20 ENCOUNTER — Encounter
